# Patient Record
Sex: Female | Born: 1980 | Race: White | Hispanic: No | Marital: Married | Smoking: Former smoker
Health system: Southern US, Community
[De-identification: ages and names within clinical notes are randomized; demographics above are authoritative.]

## PROBLEM LIST (undated history)

## (undated) DIAGNOSIS — F4010 Social phobia, unspecified: Secondary | ICD-10-CM

## (undated) DIAGNOSIS — F419 Anxiety disorder, unspecified: Secondary | ICD-10-CM

## (undated) DIAGNOSIS — M549 Dorsalgia, unspecified: Secondary | ICD-10-CM

## (undated) DIAGNOSIS — K589 Irritable bowel syndrome without diarrhea: Secondary | ICD-10-CM

## (undated) HISTORY — PX: OVARIAN CYST REMOVAL: SHX89

## (undated) HISTORY — PX: APPENDECTOMY: SHX54

## (undated) HISTORY — DX: Irritable bowel syndrome, unspecified: K58.9

## (undated) HISTORY — DX: Dorsalgia, unspecified: M54.9

---
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
## 2002-07-28 ENCOUNTER — Other Ambulatory Visit: Admission: RE | Admit: 2002-07-28 | Discharge: 2002-07-28 | Payer: Self-pay | Admitting: Obstetrics and Gynecology

## 2002-09-07 ENCOUNTER — Ambulatory Visit (HOSPITAL_COMMUNITY): Admission: AD | Admit: 2002-09-07 | Discharge: 2002-09-07 | Payer: Self-pay | Admitting: Obstetrics & Gynecology

## 2002-09-07 ENCOUNTER — Encounter (INDEPENDENT_AMBULATORY_CARE_PROVIDER_SITE_OTHER): Payer: Self-pay | Admitting: Specialist

## 2002-09-17 ENCOUNTER — Inpatient Hospital Stay (HOSPITAL_COMMUNITY): Admission: EM | Admit: 2002-09-17 | Discharge: 2002-09-19 | Payer: Self-pay | Admitting: Emergency Medicine

## 2002-09-17 ENCOUNTER — Encounter: Payer: Self-pay | Admitting: Emergency Medicine

## 2002-09-18 ENCOUNTER — Encounter: Payer: Self-pay | Admitting: Internal Medicine

## 2002-12-14 ENCOUNTER — Emergency Department (HOSPITAL_COMMUNITY): Admission: EM | Admit: 2002-12-14 | Discharge: 2002-12-14 | Payer: Self-pay | Admitting: Emergency Medicine

## 2003-06-02 ENCOUNTER — Emergency Department (HOSPITAL_COMMUNITY): Admission: EM | Admit: 2003-06-02 | Discharge: 2003-06-02 | Payer: Self-pay | Admitting: Emergency Medicine

## 2004-05-31 ENCOUNTER — Emergency Department (HOSPITAL_COMMUNITY): Admission: EM | Admit: 2004-05-31 | Discharge: 2004-05-31 | Payer: Self-pay | Admitting: Emergency Medicine

## 2005-08-24 ENCOUNTER — Inpatient Hospital Stay (HOSPITAL_COMMUNITY): Admission: EM | Admit: 2005-08-24 | Discharge: 2005-09-01 | Payer: Self-pay | Admitting: Emergency Medicine

## 2006-01-22 ENCOUNTER — Inpatient Hospital Stay (HOSPITAL_COMMUNITY): Admission: AD | Admit: 2006-01-22 | Discharge: 2006-01-22 | Payer: Self-pay | Admitting: Gynecology

## 2006-09-27 ENCOUNTER — Emergency Department (HOSPITAL_COMMUNITY): Admission: EM | Admit: 2006-09-27 | Discharge: 2006-09-27 | Payer: Self-pay | Admitting: Emergency Medicine

## 2006-10-10 ENCOUNTER — Ambulatory Visit: Payer: Self-pay | Admitting: Family Medicine

## 2006-11-05 ENCOUNTER — Emergency Department (HOSPITAL_COMMUNITY): Admission: EM | Admit: 2006-11-05 | Discharge: 2006-11-05 | Payer: Self-pay | Admitting: Emergency Medicine

## 2006-11-07 ENCOUNTER — Emergency Department (HOSPITAL_COMMUNITY): Admission: EM | Admit: 2006-11-07 | Discharge: 2006-11-07 | Payer: Self-pay | Admitting: Emergency Medicine

## 2006-11-08 ENCOUNTER — Emergency Department (HOSPITAL_COMMUNITY): Admission: EM | Admit: 2006-11-08 | Discharge: 2006-11-09 | Payer: Self-pay | Admitting: Emergency Medicine

## 2007-02-26 ENCOUNTER — Emergency Department (HOSPITAL_COMMUNITY): Admission: EM | Admit: 2007-02-26 | Discharge: 2007-02-26 | Payer: Self-pay | Admitting: *Deleted

## 2007-04-03 ENCOUNTER — Ambulatory Visit: Payer: Self-pay | Admitting: Gastroenterology

## 2007-04-23 ENCOUNTER — Ambulatory Visit: Payer: Self-pay | Admitting: Gastroenterology

## 2007-05-03 ENCOUNTER — Encounter: Payer: Self-pay | Admitting: Gastroenterology

## 2007-06-28 ENCOUNTER — Inpatient Hospital Stay (HOSPITAL_COMMUNITY): Admission: AD | Admit: 2007-06-28 | Discharge: 2007-06-28 | Payer: Self-pay | Admitting: Gynecology

## 2007-08-16 DIAGNOSIS — Z8719 Personal history of other diseases of the digestive system: Secondary | ICD-10-CM

## 2007-08-16 DIAGNOSIS — R109 Unspecified abdominal pain: Secondary | ICD-10-CM

## 2009-08-12 ENCOUNTER — Emergency Department (HOSPITAL_COMMUNITY): Admission: EM | Admit: 2009-08-12 | Discharge: 2009-08-12 | Payer: Self-pay | Admitting: Emergency Medicine

## 2009-08-13 ENCOUNTER — Emergency Department (HOSPITAL_COMMUNITY): Admission: EM | Admit: 2009-08-13 | Discharge: 2009-08-14 | Payer: Self-pay | Admitting: Emergency Medicine

## 2009-08-16 ENCOUNTER — Emergency Department (HOSPITAL_COMMUNITY): Admission: EM | Admit: 2009-08-16 | Discharge: 2009-08-16 | Payer: Self-pay | Admitting: Emergency Medicine

## 2009-12-24 ENCOUNTER — Emergency Department (HOSPITAL_COMMUNITY): Admission: EM | Admit: 2009-12-24 | Discharge: 2009-12-24 | Payer: Self-pay | Admitting: Emergency Medicine

## 2010-04-05 ENCOUNTER — Emergency Department (HOSPITAL_COMMUNITY): Admission: EM | Admit: 2010-04-05 | Discharge: 2010-04-06 | Payer: Self-pay | Admitting: Emergency Medicine

## 2010-04-26 ENCOUNTER — Ambulatory Visit (HOSPITAL_COMMUNITY)
Admission: RE | Admit: 2010-04-26 | Discharge: 2010-04-26 | Payer: Self-pay | Source: Home / Self Care | Admitting: Urology

## 2010-07-04 LAB — HEMOGLOBIN AND HEMATOCRIT, BLOOD
HCT: 39.2 % (ref 36.0–46.0)
Hemoglobin: 13.9 g/dL (ref 12.0–15.0)

## 2010-07-04 LAB — SURGICAL PCR SCREEN
MRSA, PCR: NEGATIVE
Staphylococcus aureus: NEGATIVE

## 2010-07-04 LAB — BASIC METABOLIC PANEL
BUN: 12 mg/dL (ref 6–23)
CO2: 25 mEq/L (ref 19–32)
Calcium: 9.6 mg/dL (ref 8.4–10.5)
Chloride: 101 mEq/L (ref 96–112)
Creatinine, Ser: 0.85 mg/dL (ref 0.4–1.2)
GFR calc Af Amer: >60 mL/min (ref >60)
GFR calc non Af Amer: >60 mL/min (ref >60)
Glucose, Bld: 88 mg/dL (ref 70–99)
Potassium: 4.1 mEq/L (ref 3.5–5.1)
Sodium: 137 mEq/L (ref 135–145)

## 2010-07-04 LAB — HCG, QUANTITATIVE, PREGNANCY: hCG, Beta Chain, Quant, S: <2 m[IU]/mL (ref <5)

## 2010-07-05 ENCOUNTER — Ambulatory Visit (HOSPITAL_COMMUNITY)
Admission: RE | Admit: 2010-07-05 | Discharge: 2010-07-05 | Payer: Self-pay | Source: Home / Self Care | Attending: Urology | Admitting: Urology

## 2010-07-14 NOTE — Op Note (Addendum)
  NAME:  Amanda Rogers, Amanda Rogers             ACCOUNT NO.:  [id number]  MEDICAL RECORD NO.:  [id number]          PATIENT TYPE:  AMB  LOCATION:  DAY                           FACILITY:  [facility]  PHYSICIAN:  Ky Barban, M.D.DATE OF BIRTH:  [birth year]  DATE OF PROCEDURE: DATE OF DISCHARGE:                              OPERATIVE REPORT   PREOPERATIVE DIAGNOSIS:  Recurrent urinary tract infection.  POSTOPERATIVE DIAGNOSES:  Bladder neck polyps, hemorrhagic cystitis.  Excision of bladder neck polyps with fulguration and bladder biopsy with fulguration, cystogram, dilation of urethra.  ANESTHESIA:  General.  PROCEDURE:  The patient under general anesthesia in lithotomy position, usual prep and drape, #25 cystoscope introduced into the bladder, it was inspected.  There are diffuse areas of hemorrhages in the bladder consistent with hemorrhagic cystitis.  No tumor stone, foreign body. 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  There are polyps on the bladder neck, one of these polyps was biopsied, then using Greenwald electrode, circumferentially the bladder neck, which were all around the bladder neck, they were fulgurated.  A biopsy from the posterior bladder wall was also done with a flexible biopsy forceps.  Then the area was fulgurated with [facility] electrode.  The urethra was dilated to 30- [place], cystogram was performed with 250 mL of Renografin solution.  No reflux was seen and bladder was emptied, Foley catheter removed.  The patient left the operating room in satisfactory condition.     Ky Barban, M.D.     MIJ/MEDQ  D:  07/05/2010  T:  07/05/2010  Job:  [id number]  Electronically Signed by Alleen Borne M.D. on 07/14/2010 04:53:51 PM

## 2010-07-14 NOTE — H&P (Addendum)
  NAME:  Amanda Rogers, Amanda Rogers             ACCOUNT NO.:  [id number]  MEDICAL RECORD NO.:  [id number]          PATIENT TYPE:  AMB  LOCATION:  DAY                           FACILITY:  [facility]  PHYSICIAN:  Ky Barban, M.D.DATE OF BIRTH:  1980/07/23  DATE OF ADMISSION: DATE OF DISCHARGE:  LH                             HISTORY & PHYSICAL   CHIEF COMPLAINT:  Difficulty to void.  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  She is having recurrent urinary tract infection recently.  She was unable to void and went to the emergency room.  She had a Foley catheter which has been taken out since then.  She is now voiding.  No fever, chills, or gross hematuria.  No hematuria.  She had complete workup done in the office.  She had cystometrics that shows she has a hypertonic neurogenic bladder, urethral stenosis, and bladder neck polyps, so I have advised her to undergo dilation under anesthesia, fulguration, biopsy of the bladder neck polyps, and also cystogram to rule out reflux or recurrent urinary tract infections.  PAST MEDICAL HISTORY:  She has degenerative disk disease, also having sciatica on the right side.  PERSONAL HISTORY:  She does not smoke or drink.  REVIEW OF SYSTEMS:  Unremarkable.  PHYSICAL EXAMINATION:  VITAL SIGNS:  Blood pressure 96/59, temperature 98.2. CENTRAL NERVOUS SYSTEM:  Negative. HEENT/NECK:  Negative. CHEST:  Symmetrical. HEART:  Regular sinus rhythm. ABDOMEN:  Soft and flat.  Liver, spleen, and kidneys not palpable.  No CVA tenderness. PELVIC:  No adnexal mass or tenderness.  IMPRESSION: 1. Recurrent urinary tract infection. 2. Hypertonic neurogenic bladder. 3. Bladder neck polyps.  PLAN:  Dilation, cystogram, and fulguration of bladder neck polyps.     Ky Barban, M.D.     MIJ/MEDQ  D:  07/04/2010  T:  07/04/2010  Job:  [id number]  Electronically  Signed by Alleen Borne M.D. on 07/14/2010 04:53:49 PM

## 2010-08-27 ENCOUNTER — Emergency Department (HOSPITAL_COMMUNITY)
Admission: EM | Admit: 2010-08-27 | Discharge: 2010-08-28 | Disposition: A | Discharge status: Home / Self Care | Payer: 59 | Attending: Emergency Medicine | Admitting: Emergency Medicine

## 2010-08-27 DIAGNOSIS — R319 Hematuria, unspecified: Secondary | ICD-10-CM | POA: Insufficient documentation

## 2010-08-27 DIAGNOSIS — R109 Unspecified abdominal pain: Secondary | ICD-10-CM | POA: Insufficient documentation

## 2010-08-27 DIAGNOSIS — N200 Calculus of kidney: Secondary | ICD-10-CM | POA: Insufficient documentation

## 2010-08-28 ENCOUNTER — Ambulatory Visit (HOSPITAL_COMMUNITY)
Admission: RE | Admit: 2010-08-28 | Discharge: 2010-08-28 | Disposition: A | Discharge status: Home / Self Care | Payer: Commercial Managed Care - PPO | Source: Ambulatory Visit | Attending: Emergency Medicine | Admitting: Emergency Medicine

## 2010-08-28 DIAGNOSIS — N200 Calculus of kidney: Secondary | ICD-10-CM | POA: Insufficient documentation

## 2010-08-28 DIAGNOSIS — R1031 Right lower quadrant pain: Secondary | ICD-10-CM | POA: Insufficient documentation

## 2010-08-28 DIAGNOSIS — R112 Nausea with vomiting, unspecified: Secondary | ICD-10-CM | POA: Insufficient documentation

## 2010-08-28 LAB — URINALYSIS, ROUTINE W REFLEX MICROSCOPIC
Glucose, UA: NEGATIVE mg/dL
Leukocytes, UA: NEGATIVE
Nitrite: NEGATIVE
Specific Gravity, Urine: >1.03 — ABNORMAL HIGH (ref 1.005–1.030)
pH: 5.5 (ref 5.0–8.0)

## 2010-08-28 LAB — POCT PREGNANCY, URINE: Preg Test, Ur: NEGATIVE

## 2010-08-28 LAB — URINE MICROSCOPIC-ADD ON

## 2010-08-31 LAB — DIFFERENTIAL
Lymphocytes Relative: 25 % (ref 12–46)
Lymphs Abs: 2.3 10*3/uL (ref 0.7–4.0)
Monocytes Absolute: 0.6 10*3/uL (ref 0.1–1.0)
Monocytes Relative: 7 % (ref 3–12)
Neutro Abs: 6 10*3/uL (ref 1.7–7.7)

## 2010-08-31 LAB — WET PREP, GENITAL: Clue Cells Wet Prep HPF POC: NONE SEEN

## 2010-08-31 LAB — BASIC METABOLIC PANEL
GFR calc non Af Amer: >60 mL/min (ref >60)
Glucose, Bld: 88 mg/dL (ref 70–99)
Potassium: 3.8 mEq/L (ref 3.5–5.1)
Sodium: 140 mEq/L (ref 135–145)

## 2010-08-31 LAB — CBC
HCT: 41 % (ref 36.0–46.0)
Hemoglobin: 14.1 g/dL (ref 12.0–15.0)
RBC: 4.47 MIL/uL (ref 3.87–5.11)
WBC: 9.1 10*3/uL (ref 4.0–10.5)

## 2010-08-31 LAB — URINALYSIS, ROUTINE W REFLEX MICROSCOPIC
Ketones, ur: NEGATIVE mg/dL
Nitrite: NEGATIVE
pH: 5.5 (ref 5.0–8.0)

## 2010-08-31 LAB — GC/CHLAMYDIA PROBE AMP, GENITAL: Chlamydia, DNA Probe: NEGATIVE

## 2010-09-06 ENCOUNTER — Ambulatory Visit (HOSPITAL_COMMUNITY)
Admission: RE | Admit: 2010-09-06 | Discharge: 2010-09-06 | Disposition: A | Discharge status: Home / Self Care | Payer: Commercial Managed Care - PPO | Source: Ambulatory Visit | Attending: Urology | Admitting: Urology

## 2010-09-06 ENCOUNTER — Other Ambulatory Visit (HOSPITAL_COMMUNITY): Payer: Self-pay | Admitting: Urology

## 2010-09-06 DIAGNOSIS — R319 Hematuria, unspecified: Secondary | ICD-10-CM | POA: Insufficient documentation

## 2010-09-06 DIAGNOSIS — M549 Dorsalgia, unspecified: Secondary | ICD-10-CM

## 2010-09-06 DIAGNOSIS — R935 Abnormal findings on diagnostic imaging of other abdominal regions, including retroperitoneum: Secondary | ICD-10-CM | POA: Insufficient documentation

## 2010-09-06 DIAGNOSIS — K429 Umbilical hernia without obstruction or gangrene: Secondary | ICD-10-CM | POA: Insufficient documentation

## 2010-09-06 DIAGNOSIS — R109 Unspecified abdominal pain: Secondary | ICD-10-CM | POA: Insufficient documentation

## 2010-09-06 DIAGNOSIS — N200 Calculus of kidney: Secondary | ICD-10-CM | POA: Insufficient documentation

## 2010-09-06 MED ORDER — IOHEXOL 300 MG/ML  SOLN
100.0000 mL | Freq: Once | INTRAMUSCULAR | Status: AC | PRN
  Administered 2010-09-06: 100 mL via INTRAVENOUS

## 2010-09-07 LAB — POCT I-STAT, CHEM 8
BUN: 11 mg/dL (ref 6–23)
Chloride: 106 mEq/L (ref 96–112)
Creatinine, Ser: 0.6 mg/dL (ref 0.4–1.2)
Sodium: 138 mEq/L (ref 135–145)
TCO2: 23 mmol/L (ref 0–100)

## 2010-09-07 LAB — URINE MICROSCOPIC-ADD ON

## 2010-09-07 LAB — URINALYSIS, ROUTINE W REFLEX MICROSCOPIC
Bilirubin Urine: NEGATIVE
Nitrite: NEGATIVE
Specific Gravity, Urine: 1.018 (ref 1.005–1.030)
pH: 5.5 (ref 5.0–8.0)

## 2010-09-07 LAB — HEMOGLOBIN A1C
Hgb A1c MFr Bld: 5.7 % (ref 4.6–6.1)
Mean Plasma Glucose: 117 mg/dL

## 2010-11-04 NOTE — Consult Note (Signed)
NAME:  VEE, BAHE             ACCOUNT NO.:  [id number]   MEDICAL RECORD NO.:  [id number]          PATIENT TYPE:  INP   LOCATION:  5001                         FACILITY:  [facility]   PHYSICIAN:  Bevelyn Buckles. Champey, M.D.DATE OF BIRTH:  [birth year]   DATE OF CONSULTATION:  DATE OF DISCHARGE:                                   CONSULTATION   REASON FOR CONSULTATION:  Lower extremity weakness and back pain.   HISTORY OF PRESENT ILLNESS:  Miss Amanda Rogers is a 30 year old Caucasian female  with chronic back pain for the past 3 months who presents with acute  worsening of back pain since yesterday feeling weak all through her lower  extremities and right lower quadrant numbness. The patient felt weak and  slowly brought herself to the floor. She did not drop or fall. The patient  has also been complaining of urinary retention and constipation. She has  seen Dr. Ethelene Hal in the past for epidural shot which was without benefit. She  denies any symptoms in her upper extremities, neck pain, headache, vision  changes, speech or swallowing problems, chewing problems, vertigo or loss of  consciousness. The patient did have an MRI of the lumbar spine that showed  L5-S1 disc protrusion which was mild with mild neuroforaminal narrowing  without any nerve root compression. The patient states that she is currently  9 out of 10 pain, however, she seems very comfortable and cooperative  throughout this examination.   Past medical history is positive for back problems.   Current medications include Naproxen.   ALLERGIES:  TO TRAMADOL WHICH CAUSES ITCHING.   Family history is positive for back problems.   SOCIAL HISTORY:  The patient lives with her boyfriend, denies any smoking,  alcohol or drug use.   Review of systems is positive as per HPI, review of systems is negative as  per HPI     in greater than 8 other organ systems.   PHYSICAL EXAMINATION:  VITAL SIGNS: Temperature is 100.0, pulse is 97,  respirations 20, blood pressure is 94/62, O2 sat is 98% on room air.  HEENT: Normocephalic, atraumatic. Extraocular movements are intact. Pupils  are equal, round, and reactive to light.  NECK: Supple, no carotid bruits.  HEART: Regular.  LUNGS: Clear.  ABDOMEN: Soft, nontender.  EXTREMITIES: No edema with good pulses.  NEUROLOGICAL EXAMINATION: The patient is awake, alert, and oriented x3.  Language is fluent. Memory and knowledge are within normal limits. Cranial  nerves II through XII are grossly intact. Motor examination, the patient has  4+ to 5 out of 5 strength in her upper extremities bilaterally. She has poor  effort in the lower extremities and this is secondary to pain as per  patient. She does have a positive Hoover sign noted on examination. She does  give way in all 4 extremities. The patient has normal tone and no drift  noted. Sensory examination, the patient has decreased sensation in right  lower extremity when compared to the left. Sensation is otherwise within  normal limits to light touch in the upper extremities. Reflexes 1 to 2+ and  symmetric, toes  are downgoing bilaterally. Cerebellar function is within  normal limits. Finger-to-nose and gait not assessed secondary to safety.   MRI of the lumbar spine showed mild degenerative disc disease without nerve  root compression. No current labs are available.   IMPRESSION:  This is a 30 year old Caucasian female with severe back pain,  degenerative disc disease with an episode of bilateral lower extremity  weakness and right lower extremity numbness. The patient has had give way,  weakness and Hoover sign on examination as our examination is limited  secondary to pain and give way weakness. We need to optimally control the  patient's pain as pain medications seem to be working as per patient. We  need to get PT/OT evaluation. The patient will need EMG nerve conduction  studies performed as an outpatient to evaluate for  any possible  radiculopathy. Can get the nerve conduction EMG as an outpatient and we can  follow the patient as an outpatient. Office number is [phone]. Will follow  the patient while she is in the hospital.      Bevelyn Buckles. Nash Shearer, M.D.  Electronically Signed     DRC/MEDQ  D:  08/25/2005  T:  08/28/2005  Job:  [id number]

## 2010-11-04 NOTE — Discharge Summary (Signed)
NAME:  Amanda Rogers, Amanda Rogers             ACCOUNT NO.:  [id number]   MEDICAL RECORD NO.:  [id number]          PATIENT TYPE:  INP   LOCATION:  5001                         FACILITY:  [facility]   PHYSICIAN:  Sherin Quarry, MD      DATE OF BIRTH:  11-24-1980   DATE OF ADMISSION:  08/24/2005  DATE OF DISCHARGE:  08/31/2005                                 DISCHARGE SUMMARY   HISTORY:  Amanda Rogers is a 30 year old lady who was initially  transferred to [facility] on March 8 from [facility].  The purpose of her transfer was to see Dr. Jeral Fruit of the neurosurgical  service.  The patient was apparently initially evaluated by  Dr. Noel Gerold in  the emergency room, who then called to Dr. Jeral Fruit to evaluate her.  The  patient and family became dissatisfied and insisted on her admission, but  did not wish to be admitted by Dr. Noel Gerold.  The patient was therefore  assigned to the [facility] as an unassigned transfer.  She was  initially seen by Dr. Derenda Mis.  Dr. Ladona Ridgel indicated that the patient  reported that she had a work injury in her low back in November, as a result  of lifting a heavy animal.  She was evaluated by Dr. Noel Gerold, who has  performed an MRI scan in December and placed her on prednisone for her pain.  He also prescribed three epidural steroid injections with limited benefit.  Dr. Noel Gerold had previously advised her that she was not a surgical candidate.   On presentation August 25, 2005 the patient described her back pain is 9/10 in  strength, left greater than right.  The pain seemed to radiate down both  legs.  The patient stated that she was incapacitated by pain.  She indicated  the pain was not relieved by taking Vicodin or Percocet as an outpatient.  She also indicated that she was having difficulty urinating and that she  would need to have a catheter placed. She indicated that she had been  chronically constipated.   PHYSICAL EXAMINATION:  GENERAL:  On  physical exam Dr. Derenda Mis  reported that her temperature was 98.1, blood pressure 112/69, pulse 90,  respirations 18, O2 saturation 97%.  HEENT:  Exam was within normal limits.  CHEST:  The chest was clear.  CARDIOVASCULAR:  Examination revealed normal S1-S2, without rubs, murmurs or  gallops.  ABDOMEN:  The abdomen was benign.  NEUROLOGIC:  On neurologic testing she described the patient as awake, alert  and oriented. Cranial nerves II-XII were intact.  The patient was noted to  be able to bend her legs at the knee, and move the legs from the ankle up to  the knee.  The legs could be manipulated without pain.  Sensory exam was  inconsistent.   CONSULTATIONS:  Dr. Ladona Ridgel requested a consultation of Dr. Bevelyn Buckles. Champey  of the neurology service.  Dr. Nash Shearer obtained essentially the same  history, (i.e. a 14-month history of chronic back pain with acute worsening  of back pain on the day prior to admission).  An  MRI scan of the spine was  repeated in the emergency room, and this study showed mild degenerative  changes of the lower thoracic spine, as well as mild degenerative changes at  L5-S1 with no evidence of nerve root compression.  Dr. Nash Shearer concluded  from his neurologic examination that the patient's leg weakness was  volitional.  He recommended continued pain medication and suggested that an  EMG and nerve conduction velocity study be performed as an outpatient. He  had no additional recommendations.   The patient was admitted to the hospital and was administered morphine for  pain. A Foley catheter was placed. I was advised by  Dr. Jeral Fruit and his  partners of the neurosurgical service, that they would not, under any  circumstances, see the patient in consultation.  I informed Dr. Noel Gerold of  these statements.  The patient continued to complain of low back discomfort  and weakness.  On March 15, I once again had Dr. Nash Shearer see the patient.  Dr. Nash Shearer indicated that  there was no change in her examination; that her  leg weakness was volitional and that she did not have any evidence of nerve  root compression.  I attempted to arrange for a transfer of the patient to  the [facility] at the patient's father's request.  I was advised by the  physician at [facility] that they would not accept her in transfer, and  she did not have an objective reason to be in the hospital.  They provided  me with numbers so that an outpatient evaluation at [facility] could be arranged.  These numbers were for [facility] and the chronic pain clinic.  I  provided the patient's father with these numbers.  On August 31, 2005 the  patient was discharged.   DISCHARGE DIAGNOSES:  1.  URINARY TRACT INFECTION. Note that the patient was placed on Cipro at      the time of admission.  Also note that urine culture obtained at the      time of admission grew an E-coli sensitive to all antibiotics tested.  2.  CHRONIC BACK PAIN: Possibly secondary to degenerative joint disease.  3.  CHRONIC LEG WEAKNESS:  Uunexplained by objective evaluation.  4.  CHRONIC URINARY RETENTION:  Possibly secondary to pain medications.  5.  CHRONIC CONSTIPATION:   DISCHARGE MEDICATIONS:  At the time of discharge I advised the patient I  would provide her with prescriptions for:  1.  Cipro 250 mg b.i.d. for three additional days.  This would complete 7      days of antibiotic therapy.  2.  Tylox 1-2 q.6 h p.r.n. for pain.  3.  Neurontin 3 mg t.i.d.  4.  Lactulose 1-2 tablespoons p.o. p.r.n. for constipation.   DISPOSITION:  It is not entirely clear what the patient's family's plans are  going to be at the time of discharge.  As indicated, I provided them with  contact numbers for [facility] and further advised them that [facility] would  not accept the patient in transfer as an inpatient.           ______________________________  Sherin Quarry, MD    SY/MEDQ  D:  08/31/2005  T:  09/01/2005  Job:   [id number]   cc:   Sharolyn Douglas, M.D.  Fax: [phone]   Bevelyn Buckles. Nash Shearer, M.D.  Fax: [phone]

## 2010-11-04 NOTE — Discharge Summary (Signed)
NAME:  Amanda Rogers, Amanda Rogers                       ACCOUNT NO.:  [id number]   MEDICAL RECORD NO.:  [id number]                   PATIENT TYPE:  INP   LOCATION:  0347                                 FACILITY:  [facility]   PHYSICIAN:  Deirdre Peer. Polite, M.D.              DATE OF BIRTH:  1980/11/22   DATE OF ADMISSION:  09/17/2002  DATE OF DISCHARGE:  09/19/2002                                 DISCHARGE SUMMARY   DISCHARGE DIAGNOSES:  1. Pyelonephritis.  2. Dehydration.  3. Nausea and vomiting.   DISCHARGE MEDICATIONS:  Cipro 250 mg, 1 every 12h for 7 days.   CONSULTATIONS:  None.   STUDIES:  UA significant for urine nitrites positive, 21-50 WBCs, packed  with bacteria. BMET within normal limits. CBC significant for white count of  12.7, CBC at discharge normal WBC count. Blood cultures negative to date. CT  of the chest within normal limits.   Renal ultrasound septal increase parenchymal echogenicity of right kidney  which is nonspecific, however, may be due to pyelonephritis. No evidence of  hydronephrosis. Chest x-ray no active disease.   HISTORY OF PRESENT ILLNESS:  A pleasant, 30 year old, white female presented  to the ED for progressive fever, chills, persistent vomiting over the past  three days. Her last evaluation in the ED was significant for UTI, exam was  consistent with pyelonephritis. Of note, the patient recently underwent a  surgical procedure D&C approximately 10 days prior to admission; however, at  this time, the patient denies any vaginal odor, discharge or pain. Admission  was deemed necessary for further evaluation and treatment of pyelonephritis.   PAST MEDICAL HISTORY:  As stated above.   MEDICATIONS ON ADMISSION:  None.   FAMILY HISTORY:  Noncontributory.   SOCIAL HISTORY:  Negative.   HOSPITAL COURSE:  A 30 year old white female admitted with a diagnosis of  pyelonephritis. The patient was treated with empiric antibiotics in the form  of Tequin,  ultimately was converted to Cipro. She was given judicious IV  fluids. Her hospital course was one of continued improvement. There was  nausea and vomiting, no abdominal pain. The patient had defervescence of her  fever. Blood cultures are negative at time of discharge. Urine culture is  pending. Ultrasound as stated above could be consistent with pyelonephritis.  The patient's course was somewhat complicated by complaint of chest pain,  pleuritic in nature. Imaging:  A followup CAT scan was negative for PE. At  this time, the patient was tolerating  full p.o. and is medically stable for  discharge to home.                                               Deirdre Peer. Polite, M.D.    RDP/MEDQ  D:  09/19/2002  T:  09/20/2002  Job:  [id number]

## 2010-11-04 NOTE — Assessment & Plan Note (Signed)
[facility]                         GASTROENTEROLOGY OFFICE NOTE   MAYTTE, JACOT                    MRN:          [id number]  DATE:04/03/2007                            DOB:          [birth year]    GASTROENTEROLOGY CONSULTATION   REQUESTING PHYSICIAN:  Sheppard Penton. Stacie Acres, M.D.   REASON FOR CONSULTATION:  Abdominal pain associated with alternating  diarrhea and constipation.   HISTORY OF PRESENT ILLNESS:  This is a 30 year old white female who  relates a 4 to 6 week history of worsening problems with alternating  constipation and diarrhea.  She has had intermittent nausea and vomiting  as well as gas, bloating and lower abdominal pain.  Her lower abdominal  pain is mainly associated with bowel movements.  Three to four days ago  she noted small amounts of bright red blood per rectum while wiping  after a bowel movement.  She relates a history of significant  constipation as a child but has not generally had any significant  gastrointestinal issues until about six weeks ago.  She was seen in  [facility] emergency room on February 26, 2007.  I  have reviewed the data from that visit.  A CBC, urinalysis,  comprehensive metabolic panel, lipase and urine pregnancy test were all  negative.  She notes a slight decrease in appetite over the past few  months that she relates to Lexapro and Klonopin.  CT scan of the abdomen  was performed in December of 2004 which was essentially unremarkable.  A  pelvic and transvaginal ultrasound performed on September 27, 2006 was  normal.   FAMILY HISTORY:  Remarkable for a maternal grandmother with colon  polyps, no other family members with colon polyps, colon cancer or  inflammatory bowel disease.   PAST MEDICAL HISTORY:  Anxiety  Panic disorder  Depression  Status post D&Cx2 in 2003 and 2004   CURRENT MEDICATIONS:  1. Lexapro 20 mg p.o. daily.  2. Klonopin 1 mg p.o. b.i.d.  3. Ibuprofen  p.r.n. headache.   MEDICATION ALLERGIES:  ULTRAM leading to itching.   SOCIAL HISTORY AND REVIEW OF SYSTEMS:  Per the handwritten form.   PHYSICAL EXAMINATION:  GENERAL APPEARANCE:  Well-developed, well-  nourished white female in no acute distress.  VITAL SIGNS:  Height 5 feet, 5-1/2 inches, weight 123.8 pounds.  Blood  pressure 100/68. Pulse is 78 and regular.  HEENT:  Anicteric sclerae. Oropharynx clear.  CHEST:  Clear to auscultation bilaterally.  CARDIAC:  Regular rate and rhythm without murmurs.  ABDOMEN:  Soft, nontender, nondistended.  Normal active bowel sounds.  No palpable organomegaly, masses or hernias.  RECTAL:  Exam deferred to time of colonoscopy.  EXTREMITIES:  Without cyanosis, clubbing or edema.  NEUROLOGICAL:  Alert and oriented x3.  Grossly nonfocal.   ASSESSMENT/PLAN:  Alternating diarrhea and constipation associated with  lower abdominal pain, an unexplained weight loss and small volume  hematochezia.  Rule out inflammatory bowel disease, irritable bowel  syndrome, hemorrhoids and other disorders.  Obtain a TSH today.  Begin  Robinul Forte 1/2 to 1 whole tablet b.i.d. p.r.n.  Begin a  high fiber  diet and increased fluid intake.  The risks, benefits and alternatives  to colonoscopy with possible biopsy and possible polypectomy discussed  with the patient.  She consents to proceed.  This was scheduled  electively.     Venita Lick. Russella Dar, MD, [facility]  Electronically Signed    MTS/MedQ  DD: 04/08/2007  DT: 04/09/2007  Job #: [id number]   cc:   Sheppard Penton. Stacie Acres, M.D.

## 2010-11-04 NOTE — Op Note (Signed)
   NAME:  Amanda Rogers, Amanda Rogers                       ACCOUNT NO.:  [id number]   MEDICAL RECORD NO.:  [id number]                   PATIENT TYPE:  AMB   LOCATION:  SDC                                  FACILITY:   PHYSICIAN:  Ilda Mori, M.D.                DATE OF BIRTH:  05-Oct-1980   DATE OF PROCEDURE:  09/07/2002  DATE OF DISCHARGE:                                 OPERATIVE REPORT   PREOPERATIVE DIAGNOSIS:  Incomplete abortion.   POSTOPERATIVE DIAGNOSIS:  Incomplete abortion.   OPERATION/PROCEDURE:  Dilatation and evacuation.   SURGEON:  Ilda Mori, M.D.   ANESTHESIA:  Paracervical block with IV sedation.   ESTIMATED BLOOD LOSS:  100 cc.   FINDINGS:  Products of conception.   INDICATIONS:  This is a 30 year old primigravid female who has had  approximately eight weeks of amenorrhea.  She was seen in the office seven  days ago and noted to have what appeared to be a missed abortion with a  distorted gestational sac and no development of the fetal pole.  This  finding was discussed with the patient.  Decision was made to observe for  approximately a week and to repeat the ultrasound to confirm a diagnosis of  missed AB.  It was also felt that since the pregnancy was fairly early that  a spontaneous complete abortion was certainly possible.  The patient noted  the onset of heavy vaginal bleeding this afternoon with the passage of large  clots and severe cramping.  Because of the extent of the hemorrhage, the  decision was made to proceed with suction curettage.   DESCRIPTION OF PROCEDURE:  The patient was brought to the operating room and  IV sedation was administered.  The pelvic exam was performed which showed  large amount of blood in the vagina and coming from the cervix and a  retroverted eight week size uterus.  The speculum was placed and the cervix  was visualized and grasped with a single-tooth tenaculum.  Twenty ml of 1%  lidocaine was infiltrated in the  paracervical tissues.  A #8 suction curet  was then introduced easily into the endometrial cavity without the necessity  of dilatation.  Suction was applied and the endometrial cavity was carefully  suctioned until no tissue or blood was felt to be remaining in the uterus.  The procedure was then terminated and the patient left the operating room in  good condition.                                               Ilda Mori, M.D.    RK/MEDQ  D:  09/07/2002  T:  09/08/2002  Job:  [id number]

## 2010-11-04 NOTE — Consult Note (Signed)
NAME:  Amanda Rogers, Amanda Rogers             ACCOUNT NO.:  [id number]   MEDICAL RECORD NO.:  [id number]          PATIENT TYPE:  INP   LOCATION:  5001                         FACILITY:  [facility]   PHYSICIAN:  Lindaann Slough, M.D.  DATE OF BIRTH:  04-28-1981   DATE OF CONSULTATION:  08/31/2005  DATE OF DISCHARGE:                                   CONSULTATION   REASON FOR CONSULTATION:  Urinary retention.   The patient is a 30 year old female who hurt her back at work in November  2006.  She was evaluated by Dr. Noel Gerold and had an MRI of the lumbar spine  that showed an L5-S1 disk protrusion which was mild and mild neural  foraminal narrowing without nerve root compression.  About a week ago she  was picking up some stuff from the floor at home and hurt her back again and  she had been unable to urinate on her own since.  She voids only a small  amount of urine at a time.  She was catheterized for about 700 mL.  She has  since failed three voiding trials.  She currently has an indwelling Foley  catheter that is draining clear urine.  Prior to her reinjuring her back,  she was voiding well without any problems.   PAST MEDICAL HISTORY:  Positive for back problems.   SOCIAL HISTORY:  She does not smoke nor drink.  She is single.   FAMILY HISTORY:  Her father has a history of back problems.   ALLERGIES:  She is allergic to TRAMADOL.   MEDICATIONS:  She is currently on Neurontin 300 mg, Cipro 250 mg twice a  day, morphine sulfate, Benadryl, lorazepam, Robaxin, oxycodone, and Zofran.   REVIEW OF SYSTEMS:  She complains of numbness and tingling of lower  extremities and inability to urinate, and all others are negative.   PHYSICAL EXAMINATION:  Blood pressure is 98/78, pulse 90, respirations 20,  temperature 98.  Foley catheter is in place and draining clear urine.   Her hemoglobin on March 14 was 13.5, hematocrit 39.3, and WBC 4.21.  Sodium  141, potassium 3.8, BUN 8, creatinine 1.0.  Urinalysis  showed 7-10 wbc's, 0-  2 rbc's, nitrite negative.  Urine culture on March 10 was positive for E.  coli sensitive to Cipro.   IMPRESSION:  1.  Urinary retention.  2.  Urinary tract infection.  3.  Neurogenic bladder, probably secondary to analgesics.   SUGGESTION:  1.  Leave Foley catheter indwelling.  Can go home with the Foley.  2.  Continue Cipro.  3.  We will follow the patient next week in the office.  If she is still      unable to urinate, we will then proceed with video urodynamics.      Lindaann Slough, M.D.  Electronically Signed     MN/MEDQ  D:  08/31/2005  T:  09/02/2005  Job:  [id number]

## 2010-11-04 NOTE — H&P (Signed)
NAME:  Amanda Rogers, TAMURA             ACCOUNT NO.:  [id number]   MEDICAL RECORD NO.:  [id number]          PATIENT TYPE:  INP   LOCATION:  5001                         FACILITY:  [facility]   PHYSICIAN:  Carlyon L. Ladona Ridgel, MD  DATE OF BIRTH:  [birth year]   DATE OF ADMISSION:  08/24/2005  DATE OF DISCHARGE:                                HISTORY & PHYSICAL   CHIEF COMPLAINT:  Decreased urination, leg weakness and request for change  of attending.   PRIMARY CARE PHYSICIAN:  Unassigned. Patient has none.   HISTORY OF PRESENT ILLNESS:  The patient is a 30 year old white female who  was transferred from [facility] for evaluation by Dr. Jeral Fruit after  an acute on chronic exacerbation of lower back pain. The patient was  accepted to Dr. Sherren Mocha service who previously had treated her as an  outpatient. She was seen and evaluated by Dr. Noel Gerold who then proceeded to  call Dr. Jeral Fruit for evaluation who initiated a neurology consult because of  lower extremity weakness. The patient's family and the patient were  dissatisfied and therefore requested that attending be changed. We therefore  were consulted for an unassigned transfer to our service.   The patient evidently had a work injury of her lower back in November. 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 The patient went for evaluation  to Dr. Noel Gerold. She had an MRI in December and was placed on prednisone for  pain. She also underwent epidural steroid injection x3 at the outpatient  [facility] without relief of her lower back pain. The patient was  told by Dr. Noel Gerold that there no surgical intervention necessary at this time  as she had only minor lumbar degenerative changes as well as stenosis and  the bulging of the disk in her lower back was mild and should not be causing  the pain. The patient was reevaluated during this admission by Dr. Noel Gerold  and  as stated, the patient requested to be transferred off his service. Dr.  Jeral Fruit is still scheduled to see this patient to give a second opinion  regarding a surgical intervention. On the day of this history and physical  the patient describes her pain as 9 out of 10, left greater than right-sided  pain. It came on after she bent over to pick up something on the floor. She  states that the pain was radiating down both legs at the time. At this point  she really is incapacitated related to the pain. She states that she tried a  friend's Lidocaine patch that did not give her any relief, and she was given  Vicodin and Percocet as an outpatient and all they did was make her stomach  upset. She states at this time the only thing that controls her pain is  morphine and once given that does totally alleviate her discomfort.   REVIEW OF SYSTEMS:  She complains of right leg numbness, tingling in both.  She complains of and exhibits urinary retention which required  in-and-out  catheterization x2 followed by placement of a Foley catheter. She states she  had no bowel movement since March 7. She relates that her menses was 2 weeks  ago. She states that her lower back pain feels as though she is being  stabbed in the back spinal cord. She was unable to walk prior to coming  in. She said she had to crouch and now with the continued inability to  urinate she is very frustrated. I have reviewed what the patient was taking  for pain medication at home. It sounds like she may have been on Naprosyn  although she states she was perhaps on another medicine that starts with  an N-P. On further review of systems she denies fever, chills, nausea,  vomiting or abdominal pain, all else are negative. See the HPI for further  elucidation of other review of systems.   PAST MEDICAL HISTORY:  1.  Lower back injury in November.  2.  She describes no abnormal Pap smears and her last Pap smear was 1.5      years  ago.   PAST SURGICAL HISTORY:  She had a D&C 3 years ago.   SOCIAL HISTORY:  She denies tobacco or ethanol. She works as a Dispensing optician.   FAMILY HISTORY:  Mom is living and is healthy. Dad is living with back  issues.   ALLERGIES:  TRAMADOL - causes itching, she claims that PERCOCET AND VICODIN  cause nausea.   MEDICATIONS:  Advil or Tylenol p.r.n.   PHYSICAL EXAMINATION:  VITAL SIGNS:  Temperature is 98.1, blood pressure  112/69, pulse is 90, respirations 18, saturation 97%.  HEENT:  She is normocephalic, atraumatic. Pupils are equal, round and  reactive to light. Extraocular muscles are intact. Mucous membranes are  moist. Her eyes are very dry secondary to her contacts. She has no  conjunctival injection.  CHEST:  Clear to auscultation with no rhonchi, rales or wheezes.  CARDIOVASCULAR:  Regular rate and rhythm, positive S1, S2, no S3, S4, no  murmurs, rubs or gallops.  ABDOMEN:  Soft, nontender, nondistended with positive bowel sounds.  EXTREMITIES:  Show no clubbing, cyanosis or edema.  NEUROLOGIC:  She is awake, alert and oriented. Cranial nerves II-XII are  intact. Power is 4+ out of 5. While the patient states she cannot lift her  legs off the bed, she is able to bend them at the knee and climb them up.  With passive movement of the legs she is unable to attain even a 30 degree  angle without saying that she is in pain. However if you take your hand away  she will hold the leg up and help lift it without too much discomfort. The  only sensory deficit was noted on the left shin which at the middle of the  shin, forward distribution appeared to be dull. However further testing of  the upper and lower dermatome showed that the sensation was intact. There  does not appear to be any other sensory deficits including a thoracic or  cervical deficit.   LABS AT TIME OF ADMISSION:  None.   ASSESSMENT AND PLAN:  This is a 30 year old white female who injured her back  at work lifting a heavy animal. The patient states that she re-injured  herself on the day of admission while cleaning her home. She states a pain  developed in her back after she bent over that felt like someone was  stabbing her with a knife. She  continues to have an 8 out of 10 pain and  requests opiates which seem to be the only thing to control her pain. The  patient states she cannot raise herself to a full standing position and had  to crouch and is unable to walk.  EMS took her to [place]. She states that she was transferred at the  hospital's request to see Dr. Jeral Fruit. However other notes indicate that the  patients family insisted that she be transferred. The patient's hospital  course has been complicated by urinary retention and obstipation.   PROBLEM #1. Genitourinary. Will continue to leave her Foley in place, check  a urinalysis, C&S because she did have a traumatic Foley and there is a  perineal edema. If she has a urinary tract infection we will treat that with  intravenous antibiotics.   PROBLEM #2. Gastroenterology. Will add Colace for obstipation.   PROBLEM #3. Neurologic. It is noted that the neurologist wants to possibly  due EMG studies as an outpatient. For tonight I will give her a trial of  Neurontin (if she is not pregnant) and see if this helps with the pain at  all. Will use some Toradol IV and then use p.o. morphine low-dose as a back  up if she is unable to tolerate the other pain medications. Will check a  CRP, ESR and TSH to rule out other causes for lower back pain.   PROBLEM #4. Pain control. For now will use some Toradol IV and leave a back  up dose of morphine immediate release p.o. as well as start her on some  Neurontin.   PROBLEM #5.  Deep venous thrombosis prophylaxis. Will use SCDs for now.      Yeilyn L. Ladona Ridgel, MD  Electronically Signed     MLT/MEDQ  D:  08/26/2005  T:  08/26/2005  Job:  [id number]   cc:   Hilda Lias, M.D.   Fax: [phone]

## 2011-03-08 LAB — URINALYSIS, ROUTINE W REFLEX MICROSCOPIC
Bilirubin Urine: NEGATIVE
Nitrite: NEGATIVE
Specific Gravity, Urine: <1.005 — ABNORMAL LOW
Urobilinogen, UA: 0.2
pH: 7

## 2011-03-08 LAB — WET PREP, GENITAL
Trich, Wet Prep: NONE SEEN
Yeast Wet Prep HPF POC: NONE SEEN

## 2011-03-08 LAB — GC/CHLAMYDIA PROBE AMP, GENITAL
Chlamydia, DNA Probe: NEGATIVE
GC Probe Amp, Genital: NEGATIVE

## 2011-03-08 LAB — CBC
Hemoglobin: 13.1
MCHC: 36
RBC: 3.97
WBC: 11.4 — ABNORMAL HIGH

## 2011-03-08 LAB — HCG, QUANTITATIVE, PREGNANCY: hCG, Beta Chain, Quant, S: 22425 — ABNORMAL HIGH

## 2011-03-08 LAB — POCT PREGNANCY, URINE: Operator id: 220991

## 2011-03-08 LAB — ABO/RH: ABO/RH(D): A POS

## 2011-03-31 LAB — COMPREHENSIVE METABOLIC PANEL
AST: 16
CO2: 27
Calcium: 9.3
Creatinine, Ser: 0.81
GFR calc Af Amer: >60
GFR calc non Af Amer: >60

## 2011-03-31 LAB — URINALYSIS, ROUTINE W REFLEX MICROSCOPIC
Bilirubin Urine: NEGATIVE
Glucose, UA: NEGATIVE
Hgb urine dipstick: NEGATIVE
Ketones, ur: NEGATIVE
Nitrite: NEGATIVE
Specific Gravity, Urine: 1.015
pH: 7.5

## 2011-03-31 LAB — LIPASE, BLOOD: Lipase: 23

## 2011-03-31 LAB — CBC
MCHC: 34.2
MCV: 91.4
Platelets: 248
RBC: 4.43

## 2011-03-31 LAB — DIFFERENTIAL
Eosinophils Relative: 2
Lymphocytes Relative: 18
Lymphs Abs: 1.7

## 2012-05-04 ENCOUNTER — Encounter (HOSPITAL_COMMUNITY): Payer: Self-pay | Admitting: Emergency Medicine

## 2012-05-04 ENCOUNTER — Emergency Department (HOSPITAL_COMMUNITY)
Admission: EM | Admit: 2012-05-04 | Discharge: 2012-05-04 | Disposition: A | Discharge status: Home / Self Care | Payer: PRIVATE HEALTH INSURANCE | Source: Home / Self Care

## 2012-05-04 DIAGNOSIS — F411 Generalized anxiety disorder: Secondary | ICD-10-CM

## 2012-05-04 DIAGNOSIS — R4589 Other symptoms and signs involving emotional state: Secondary | ICD-10-CM

## 2012-05-04 HISTORY — DX: Anxiety disorder, unspecified: F41.9

## 2012-05-04 HISTORY — DX: Social phobia, unspecified: F40.10

## 2012-05-04 MED ORDER — CLONAZEPAM 0.5 MG PO TABS: 0.5000 mg | ORAL_TABLET | Freq: Two times a day (BID) | ORAL | Status: DC | PRN

## 2012-05-04 NOTE — ED Provider Notes (Signed)
Medical screening examination/treatment/procedure(s) were performed by non-physician practitioner and as supervising physician I was immediately available for consultation/collaboration.  Giani Winther, M.D.   Ellasyn Swilling C Jenea Dake, MD 05/04/12 2233 

## 2012-05-04 NOTE — ED Provider Notes (Signed)
History     CSN: [id number]  Arrival date & time 05/04/12  1707   None     Chief Complaint  Patient presents with  . Anxiety    (Consider location/radiation/quality/duration/timing/severity/associated sxs/prior treatment) HPI Comments: 31 year old female presents with acute anxiety. She has a history of general anxiety disorder and is treated with Lexapro currently. And her remote history she was also treated with clonazepam. She was in [place] and received a call earlier this week that her father had a stroke and she went to the urgent care at her home and was given Xanax for the flight here. She takes Xanax and probably a couple of hours she has relief of anxiety but due to the short half-life anxiety returns and she has racing thoughts and inability to remain calm and becomes hyperactive due to severe anxiety. She states that she knows Xanax as a short half-life and have not worked well for her in the past and she mentions clonazepam that she could years ago and later weaned off.   Past Medical History  Diagnosis Date  . Anxiety   . Social anxiety disorder     Past Surgical History  Procedure Date  . Cesarean section   . Appendectomy     No family history on file.  History  Substance Use Topics  . Smoking status: Never Smoker   . Smokeless tobacco: Not on file  . Alcohol Use: Yes    OB History    Grav Para Term Preterm Abortions TAB SAB Ect Mult Living                  Review of Systems  Psychiatric/Behavioral:       As per history of present illness  All other systems reviewed and are negative.    Allergies  Ultram  Home Medications   Current Outpatient Rx  Name  Route  Sig  Dispense  Refill  . ESCITALOPRAM OXALATE 20 MG PO TABS   Oral   Take 20 mg by mouth daily.         Marland Kitchen LEVONORGESTREL-ETHINYL ESTRAD 0.1-20 MG-MCG PO TABS   Oral   Take 1 tablet by mouth daily.         Marland Kitchen CLONAZEPAM 0.5 MG PO TABS   Oral   Take 1 tablet (0.5 mg total) by  mouth 2 (two) times daily as needed for anxiety.   20 tablet   0     BP 132/81  Pulse 94  Temp 99.4 F (37.4 C) (Oral)  Resp 18  SpO2 98%  LMP 05/01/2012  Physical Exam  Constitutional: She is oriented to person, place, and time. She appears well-developed and well-nourished. No distress.  HENT:  Head: Normocephalic and atraumatic.  Neck: Neck supple.  Pulmonary/Chest: Effort normal.  Musculoskeletal: Normal range of motion.  Neurological: She is alert and oriented to person, place, and time. No cranial nerve deficit.  Skin: Skin is warm and dry.  Psychiatric: Her mood appears anxious. Her affect is labile. Her speech is rapid and/or pressured. She is agitated. Cognition and memory are not impaired. She expresses no homicidal and no suicidal ideation.    ED Course  Procedures (including critical care time)  Labs Reviewed - No data to display No results found.   1. GAD (generalized anxiety disorder)   2. Anxiety as acute reaction to exceptional stress       MDM  Stop the Xanax. Clonazepam 0.5 mg twice a day when necessary nerves #20  Followup with your doctor as soon as possible may also consider the behavioral health center locally.         Hayden Rasmussen, NP 05/04/12 [id number]

## 2012-05-04 NOTE — ED Notes (Signed)
Pt c/o anxiety since Thursday when she found out her father had a stroke... Flew in from [place] on Friday and was given xanax .5mg  before coming to [facility]... Sx include: nauseas, diarrhea, headaches, not sleeping well... Denies: fevers, vomiting, SOB, chest tightness... Pt is alert w/no signs of distress... Hx of anxiety and social anxiety

## 2013-01-23 IMAGING — CT CT ABD-PELV W/ CM
2 of 4 series · 16 of 46 positions shown, 18 images · IV contrast (Omnipaque 300)
Comparison: 08/28/2010

CLINICAL DATA: : Abdominal and back pain, hematuria, history kidney
stones, appendectomy, cesarean section

CT ABDOMEN AND PELVIS WITH CONTRAST
TECHNIQUE: Multidetector CT imaging of the abdomen and pelvis was
performed following the standard protocol during bolus
administration of intravenous contrast. Breast shield utilized.
Sagittal and coronal MPR images reconstructed from axial data set.
Contrast: 100 ml Omnipaque 300 IV; No oral contrast administered.

[Series 2: abd_pel_with 5.0 b40f · axial · 0.67mm/px · z∈[-517,-72]mm · 13 of 97 slices shown, 15 images]
[im 4/97  soft-tissue]
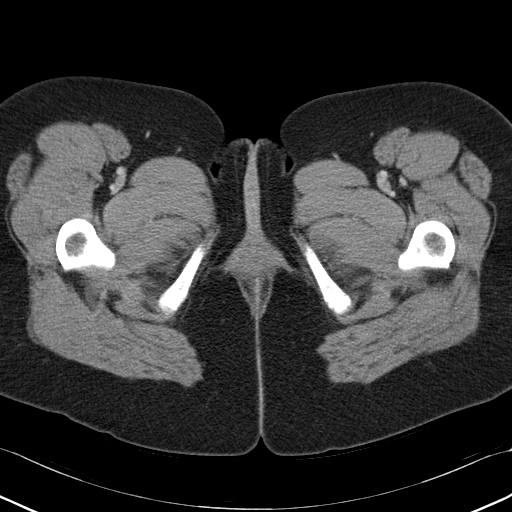
[im 4/97  bone]
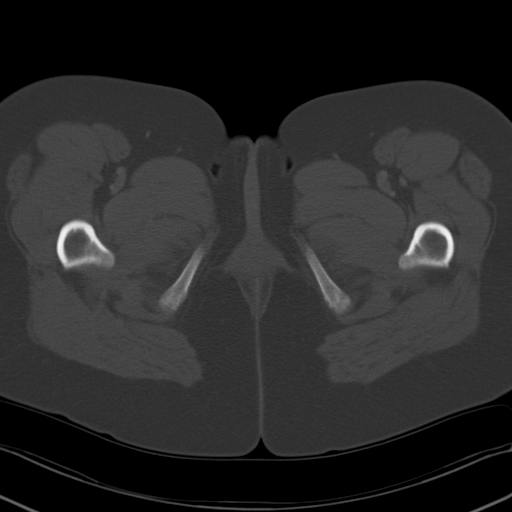
[im 12/97  soft-tissue]
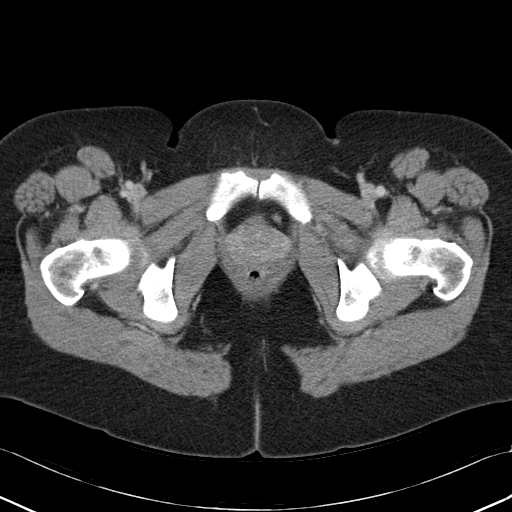
[im 20/97  soft-tissue]
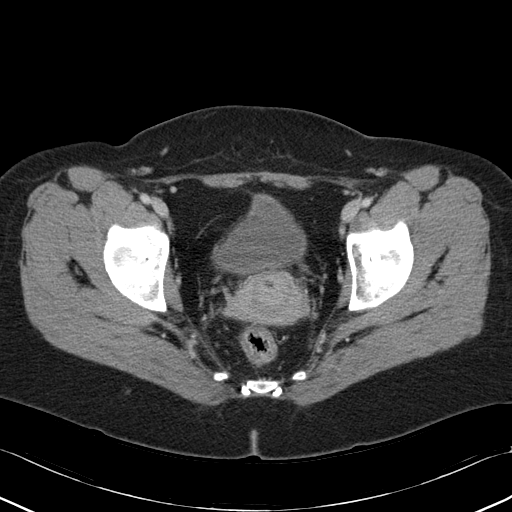
[im 27/97  soft-tissue]
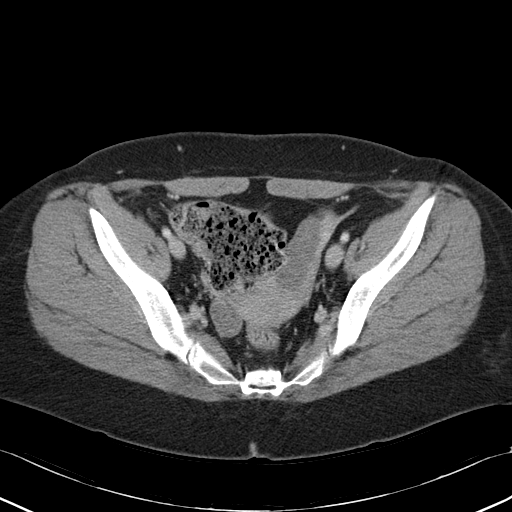
[im 35/97  soft-tissue]
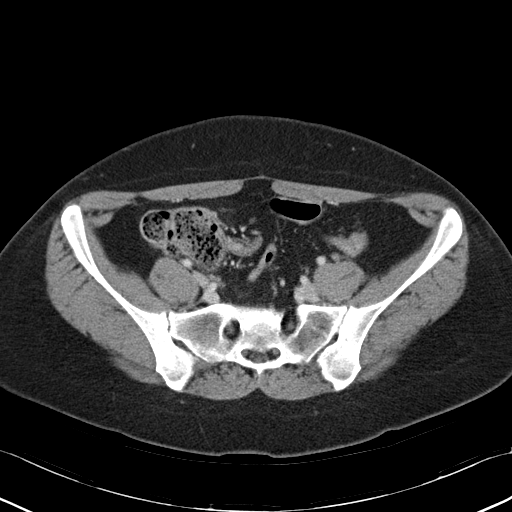
[im 43/97  soft-tissue]
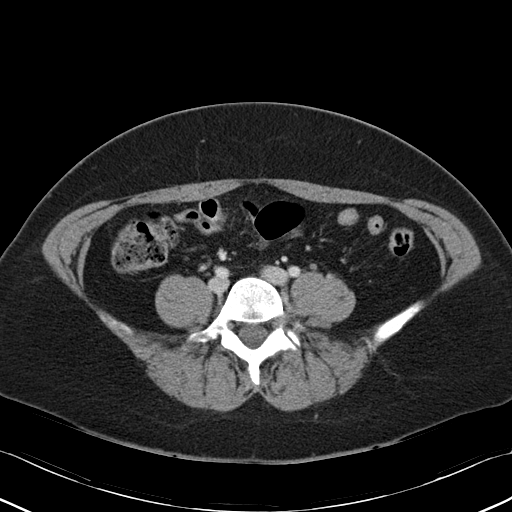
[im 50/97  soft-tissue]
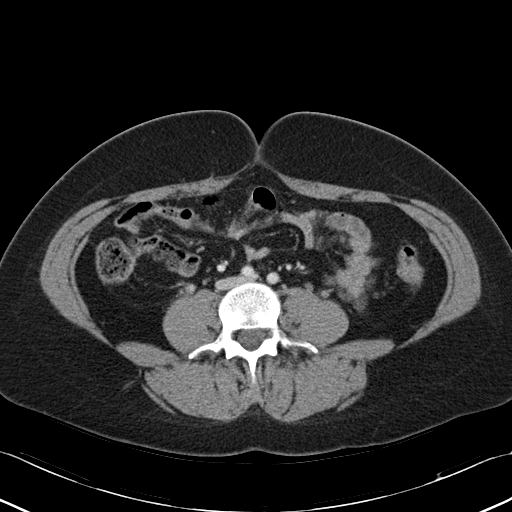
[im 54/97  soft-tissue]
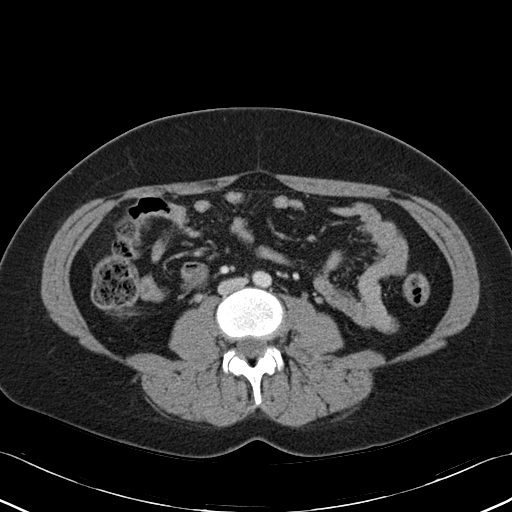
[im 62/97  soft-tissue]
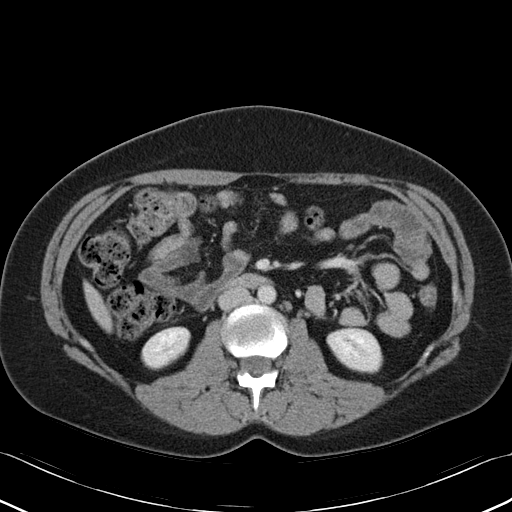
[im 62/97  bone]
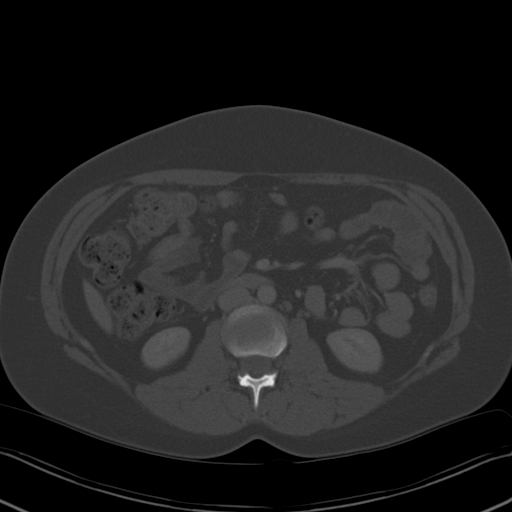
[im 70/97  soft-tissue]
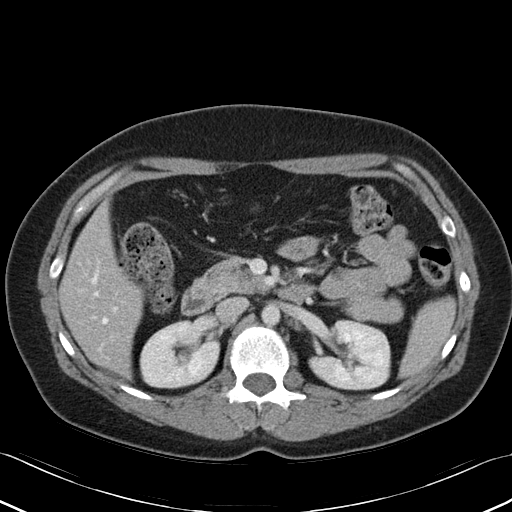
[im 77/97  soft-tissue]
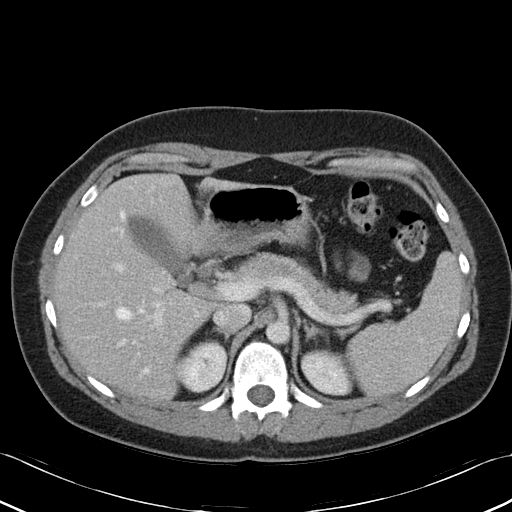
[im 85/97  soft-tissue]
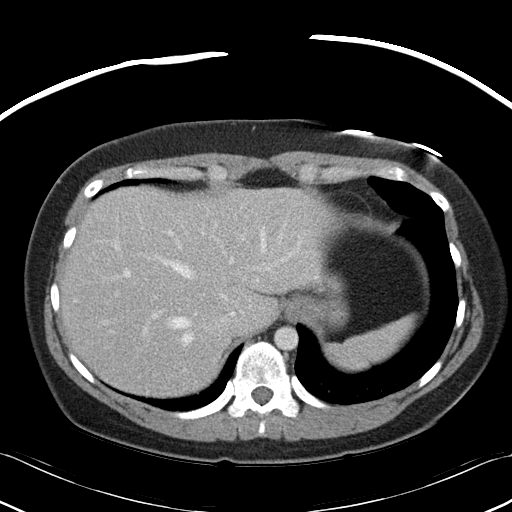
[im 93/97  soft-tissue]
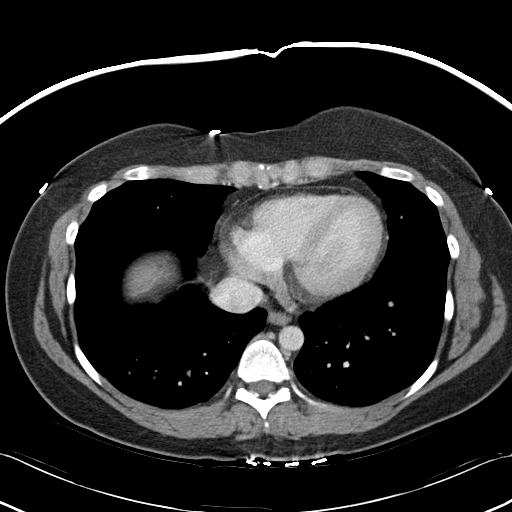

[Series 4: abd_pel_with 3.0 spo cor · coronal · 0.68mm/px · 3 of 77 slices shown]
[im 26/77  soft-tissue]
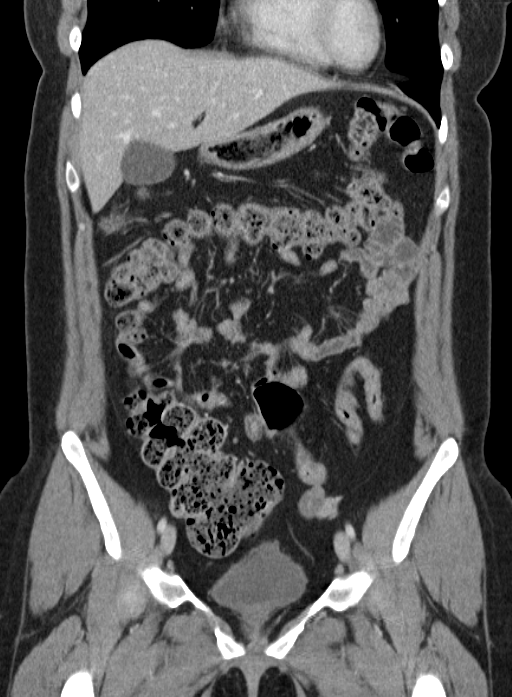
[im 34/77  soft-tissue]
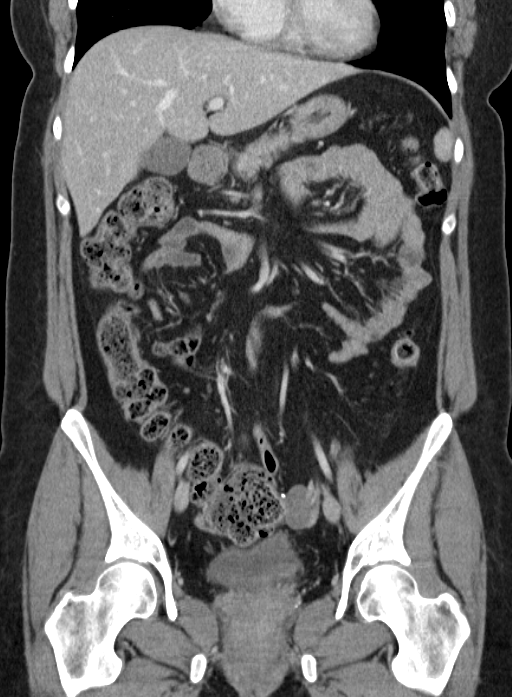
[im 43/77  soft-tissue]
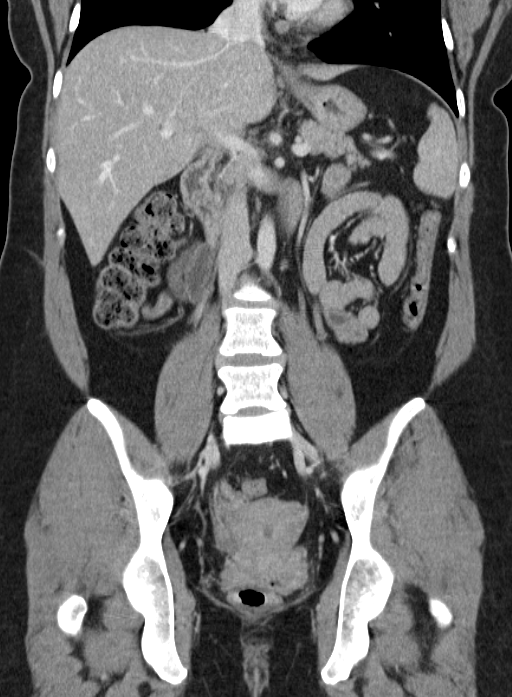

[16 of 46 positions shown; findings below may reference images not displayed]

FINDINGS: Dependent atelectasis at lung bases.
Questionable vague focus of low attenuation within right lobe
liver, 11 mm diameter.
Remainder of liver, spleen, pancreas, and adrenal glands normal
appearance.
Symmetric nephrograms with 2 tiny nonobstructing left renal
calculi.
No hydronephrosis, ureteral dilatation or ureteral stone.
Bladder only partially distended, unremarkable.
Left ovary slight prominent size versus right but stable.
Unremarkable uterus.

Bowel loops unopacified underdistended, with suboptimal assessment
of bowel wall thickness; no definite focal bowel abnormalities
identified.
Stomach decompressed.
No mass, adenopathy, free fluid or inflammatory process otherwise
seen.
Tiny umbilical hernia containing fat.
No acute osseous findings.
IMPRESSION: Two tiny nonobstructing left renal calculi.
Tiny umbilical hernia containing fat.
No other definite urinary tract abnormalities identified.
Questionable vague focus of low attenuation centrally in liver,
cannot exclude subtle mass; recommend follow-up MRI abdomen with
and without contrast to assess.

## 2017-01-06 ENCOUNTER — Emergency Department (HOSPITAL_COMMUNITY)
Admission: EM | Admit: 2017-01-06 | Discharge: 2017-01-06 | Disposition: A | Discharge status: Home / Self Care | Payer: Managed Care, Other (non HMO) | Attending: Emergency Medicine | Admitting: Emergency Medicine

## 2017-01-06 ENCOUNTER — Emergency Department (HOSPITAL_COMMUNITY): Payer: Managed Care, Other (non HMO)

## 2017-01-06 ENCOUNTER — Encounter (HOSPITAL_COMMUNITY): Payer: Self-pay | Admitting: *Deleted

## 2017-01-06 DIAGNOSIS — N3001 Acute cystitis with hematuria: Secondary | ICD-10-CM

## 2017-01-06 DIAGNOSIS — Z79899 Other long term (current) drug therapy: Secondary | ICD-10-CM | POA: Insufficient documentation

## 2017-01-06 DIAGNOSIS — R102 Pelvic and perineal pain: Secondary | ICD-10-CM | POA: Diagnosis present

## 2017-01-06 LAB — URINALYSIS, ROUTINE W REFLEX MICROSCOPIC
BACTERIA UA: NONE SEEN
BILIRUBIN URINE: NEGATIVE
Glucose, UA: NEGATIVE mg/dL
Ketones, ur: NEGATIVE mg/dL
NITRITE: NEGATIVE
Protein, ur: NEGATIVE mg/dL
Specific Gravity, Urine: 1.016 (ref 1.005–1.030)
pH: 6 (ref 5.0–8.0)

## 2017-01-06 LAB — PREGNANCY, URINE: PREG TEST UR: NEGATIVE

## 2017-01-06 MED ORDER — CYCLOBENZAPRINE HCL 5 MG PO TABS: 5.0000 mg | ORAL_TABLET | Freq: Three times a day (TID) | ORAL | 0 refills | Status: DC | PRN

## 2017-01-06 MED ORDER — METOCLOPRAMIDE HCL 5 MG/ML IJ SOLN
10.0000 mg | Freq: Once | INTRAMUSCULAR | Status: AC
  Administered 2017-01-06: 10 mg via INTRAVENOUS

## 2017-01-06 MED ORDER — ONDANSETRON HCL 4 MG/2ML IJ SOLN
4.0000 mg | Freq: Once | INTRAMUSCULAR | Status: AC
  Administered 2017-01-06: 4 mg via INTRAVENOUS
  Filled 2017-01-06: qty 2

## 2017-01-06 MED ORDER — METOCLOPRAMIDE HCL 5 MG/ML IJ SOLN
INTRAMUSCULAR | Status: AC
  Filled 2017-01-06: qty 2

## 2017-01-06 MED ORDER — FENTANYL CITRATE (PF) 100 MCG/2ML IJ SOLN
INTRAMUSCULAR | Status: AC
  Filled 2017-01-06: qty 2

## 2017-01-06 MED ORDER — CEPHALEXIN 500 MG PO CAPS: 500.0000 mg | ORAL_CAPSULE | Freq: Three times a day (TID) | ORAL | 0 refills | Status: DC

## 2017-01-06 MED ORDER — KETOROLAC TROMETHAMINE 30 MG/ML IJ SOLN
30.0000 mg | Freq: Once | INTRAMUSCULAR | Status: AC
  Administered 2017-01-06: 30 mg via INTRAVENOUS
  Filled 2017-01-06: qty 1

## 2017-01-06 MED ORDER — FENTANYL CITRATE (PF) 100 MCG/2ML IJ SOLN
50.0000 ug | Freq: Once | INTRAMUSCULAR | Status: AC
Start: 2017-01-06 — End: 2017-01-06
  Administered 2017-01-06: 50 ug via INTRAVENOUS

## 2017-01-06 MED ORDER — DEXTROSE 5 % IV SOLN
1.0000 g | Freq: Once | INTRAVENOUS | Status: AC
  Administered 2017-01-06: 1 g via INTRAVENOUS
  Filled 2017-01-06: qty 10

## 2017-01-06 MED ORDER — NAPROXEN 500 MG PO TABS: ORAL_TABLET | ORAL | 0 refills | Status: DC

## 2017-01-06 NOTE — ED Triage Notes (Signed)
Pt c/o right flank pain that started a couple of hours ago; pt is vomiting and states she is having same symptoms as when she had a kidney stone

## 2017-01-06 NOTE — ED Provider Notes (Signed)
[facility]Y DEPT Provider Note   CSN: [id number] Arrival date & time: 01/06/17  0038  Time seen 01:00 AM   History   Chief Complaint Chief Complaint  Patient presents with  . Flank Pain    HPI Amanda Rogers is a 36 y.o. female.  HPI  patient reports about 9:30 PM she started getting mild lower back pain bilaterally and pelvic pain. She states the pelvic pain is worse on the right. She states it started gradually and got worse. It is constant but waxes and wanes. She states after she urinates the pain gets worse, nothing makes it feel better. She has had nausea and vomited about 3 times. She denies diarrhea, hematuria, or fever. She denies any change in her activity. She states [place] at the end of June. She has a history kidney stones and states the last one she passed was about a year ago. She's never had to have an intervention to help her pass the stone. She also states she gets frequent urinary tract infections the last time was within the past year. She denies being a milk drinker but does drink about 4 diet sodas a day. She has a family history of kidney stones. She doesn't have a urologist at her home.  [facility] [place]  Past Medical History:  Diagnosis Date  . Anxiety   . Social anxiety disorder     Patient Active Problem List   Diagnosis Date Noted  . ABDOMINAL PAIN, LOWER 08/16/2007  . HEMATOCHEZIA, HX OF 08/16/2007    Past Surgical History:  Procedure Laterality Date  . APPENDECTOMY    . CESAREAN SECTION      OB History    No data available       Home Medications    Prior to Admission medications   Medication Sig Start Date End Date Taking? Authorizing Provider  escitalopram (LEXAPRO) 20 MG tablet Take 20 mg by mouth daily.   Yes [provider]  GABAPENTIN PO Take by mouth.   Yes [provider]  propantheline (PROBANTHINE) 15 MG tablet Take 15 mg by mouth 3 (three) times daily with meals.   Yes [provider]  cephALEXin (KEFLEX) 500 MG capsule Take 1 capsule (500 mg total) by mouth 3 (three) times daily. 01/06/17   Devoria AlbeKnapp, Ethelyn Cerniglia, MD  clonazePAM (KLONOPIN) 0.5 MG tablet Take 1 tablet (0.5 mg total) by mouth 2 (two) times daily as needed for anxiety. Patient taking differently: Take 1 mg by mouth 2 (two) times daily as needed for anxiety.  05/04/12   Hayden RasmussenMabe, David, NP  cyclobenzaprine (FLEXERIL) 5 MG tablet Take 1 tablet (5 mg total) by mouth 3 (three) times daily as needed. 01/06/17   Devoria AlbeKnapp, Sama Arauz, MD  levonorgestrel-ethinyl estradiol (AVIANE,ALESSE,LESSINA) 0.1-20 MG-MCG tablet Take 1 tablet by mouth daily.    [provider]  naproxen (NAPROSYN) 500 MG tablet Take 1 po BID with food prn pain 01/06/17   Devoria AlbeKnapp, Tasman Zapata, MD    Family History History reviewed. No pertinent family history.  Social History Social History  Substance Use Topics  . Smoking status: Never Smoker  . Smokeless tobacco: Never Used  . Alcohol use Yes     Allergies   Ultram [tramadol]   Review of Systems Review of Systems  All other systems reviewed and are negative.    Physical Exam Updated Vital Signs BP 134/66   Pulse 75   Temp 97.8 F (36.6 C) (Oral)   Resp 18  Ht 5\' 5"  (1.651 m)   Wt 92.1 kg (203 lb)   LMP 12/20/2016   SpO2 100%   BMI 33.78 kg/m   Vital signs normal    Physical Exam  Constitutional: She is oriented to person, place, and time. She appears well-developed and well-nourished.  Non-toxic appearance. She does not appear ill. She appears distressed.  Seems uncomfortable when she changes positions.  HENT:  Head: Normocephalic and atraumatic.  Right Ear: External ear normal.  Left Ear: External ear normal.  Nose: Nose normal. No mucosal edema or rhinorrhea.  Mouth/Throat: Oropharynx is clear and moist and mucous membranes are normal. No dental abscesses or uvula swelling.  Eyes: Pupils are equal, round, and reactive to light. Conjunctivae and EOM are normal.  Neck:  Normal range of motion and full passive range of motion without pain. Neck supple.  Cardiovascular: Normal rate, regular rhythm and normal heart sounds.  Exam reveals no gallop and no friction rub.   No murmur heard. Pulmonary/Chest: Effort normal and breath sounds normal. No respiratory distress. She has no wheezes. She has no rhonchi. She has no rales. She exhibits no tenderness and no crepitus.  Abdominal: Soft. Normal appearance and bowel sounds are normal. She exhibits no distension. There is tenderness. There is no rebound and no guarding.    Patient has diffuse abdominal pain but it's worse in the right lower quadrant.  Genitourinary:  Genitourinary Comments: Patient is tender bilaterally over the CVA but is worse on the right. She also seems painful when she tries to sit up or change positions.  Musculoskeletal: Normal range of motion. She exhibits no edema or tenderness.  Moves all extremities well.   Neurological: She is alert and oriented to person, place, and time. She has normal strength. No cranial nerve deficit.  Skin: Skin is warm, dry and intact. No rash noted. No erythema. No pallor.  Psychiatric: She has a normal mood and affect. Her speech is normal and behavior is normal. Her mood appears not anxious.  Nursing note and vitals reviewed.    ED Treatments / Results  Labs (all labs ordered are listed, but only abnormal results are displayed) Results for orders placed or performed during the hospital encounter of 01/06/17  Urinalysis, Routine w reflex microscopic- may I&O cath if menses  Result Value Ref Range   Color, Urine YELLOW YELLOW   APPearance HAZY (A) CLEAR   Specific Gravity, Urine 1.016 1.005 - 1.030   pH 6.0 5.0 - 8.0   Glucose, UA NEGATIVE NEGATIVE mg/dL   Hgb urine dipstick LARGE (A) NEGATIVE   Bilirubin Urine NEGATIVE NEGATIVE   Ketones, ur NEGATIVE NEGATIVE mg/dL   Protein, ur NEGATIVE NEGATIVE mg/dL   Nitrite NEGATIVE NEGATIVE   Leukocytes, UA SMALL  (A) NEGATIVE   RBC / HPF 6-30 0 - 5 RBC/hpf   WBC, UA 6-30 0 - 5 WBC/hpf   Bacteria, UA NONE SEEN NONE SEEN   Squamous Epithelial / LPF 0-5 (A) NONE SEEN  Pregnancy, urine  Result Value Ref Range   Preg Test, Ur NEGATIVE NEGATIVE   Laboratory interpretation all normal except Hematuria and leukocytosis without positive nitrite.    EKG  EKG Interpretation None       Radiology Ct Renal Stone Study  Result Date: 01/06/2017 CLINICAL DATA:  Right flank pain starting a few hours ago. Vomiting. EXAM: CT ABDOMEN AND PELVIS WITHOUT CONTRAST TECHNIQUE: Multidetector CT imaging of the abdomen and pelvis was performed following the standard protocol without IV contrast.  COMPARISON:  09/06/2010 FINDINGS: Lower chest: The included heart is normal in size. No pericardial effusion. Hepatobiliary: Hepatic steatosis. Focal fatty sparing about the gallbladder fossa. No biliary dilatation. Physiologic distention of the gallbladder without calculus. Pancreas: Negative Spleen: Negative.  Small splenule. Adrenals/Urinary Tract: Normal bilateral adrenal glands. Nonobstructing lower pole renal calculi on the left measuring 5 x 9 x 4 mm in aggregate. No hydroureteronephrosis. Normal bladder without calculus or mass. Stomach/Bowel: Appendectomy. Contracted stomach. Normal small bowel rotation without obstruction or inflammation. Moderate colonic fecal residue throughout large bowel to the level of the descending colon. Vascular/Lymphatic: No significant vascular findings are present. No enlarged abdominal or pelvic lymph nodes. Reproductive: Uterus and bilateral adnexa are unremarkable. Other: No abdominal wall hernia or abnormality. No abdominopelvic ascites. Musculoskeletal: No acute or significant osseous findings. IMPRESSION: 1. Left lower pole nonobstructing are renal calculi in aggregate measuring 5 x 9 x 4 mm. No hydroureteronephrosis. 2. Diffuse hepatic steatosis with focal fatty sparing at the gallbladder fossa.  3. Status post appendectomy. 4. No acute bowel inflammation or obstruction. Electronically Signed   By: Tollie Eth M.D.   On: 01/06/2017 02:42    Procedures Procedures (including critical care time)  Medications Ordered in ED Medications  ketorolac (TORADOL) 30 MG/ML injection 30 mg (30 mg Intravenous Given 01/06/17 0130)  ondansetron (ZOFRAN) injection 4 mg (4 mg Intravenous Given 01/06/17 0129)  cefTRIAXone (ROCEPHIN) 1 g in dextrose 5 % 50 mL IVPB (1 g Intravenous New Bag/Given 01/06/17 0206)  fentaNYL (SUBLIMAZE) injection 50 mcg (50 mcg Intravenous Given 01/06/17 0204)  metoCLOPramide (REGLAN) injection 10 mg (10 mg Intravenous Given 01/06/17 0205)     Initial Impression / Assessment and Plan / ED Course  I have reviewed the triage vital signs and the nursing notes.  Pertinent labs & imaging results that were available during my care of the patient were reviewed by me and considered in my medical decision making (see chart for details).  Patient was given IV Toradol for pain. After reviewing her urinalysis which is suspicious for possible UTI, she was given Rocephin 1 g IV and a urine culture was obtained. Renal CT scan was ordered to look to see if she's passing a kidney stone. Patient is status post appendectomy.  Nurse reports patient did not get relief after IV Toradol. She then was given fentanyl, and she states she was still having nausea so she was given Reglan IV for her pain and nausea.  Recheck at 4:25 AM patient states she's a little bit better. We discussed her test results. At this point her pain is worse on the right side however she only has kidney stones on the left and she has not passing any stones, no ureterolithiasis. She has a possible UTI and a urine culture was sent. She was given IV Rocephin. At this point it feel like her pain is most likely from a possible UTI and is musculoskeletal in origin.  Review of the [place] shows a prescription filled on  July 16 for #60 clonazepam 1 mg tablets written from her doctor in [place]. They were filled in [facility]. I am unable to access [place] controlled site.  Final Clinical Impressions(s) / ED Diagnoses   Final diagnoses:  Acute cystitis with hematuria     New Prescriptions New Prescriptions   CEPHALEXIN (KEFLEX) 500 MG CAPSULE    Take 1 capsule (500 mg total) by mouth 3 (three) times daily.   CYCLOBENZAPRINE (FLEXERIL) 5 MG TABLET  Take 1 tablet (5 mg total) by mouth 3 (three) times daily as needed.   NAPROXEN (NAPROSYN) 500 MG TABLET    Take 1 po BID with food prn pain    Plan discharge  Devoria Albe, MD, Concha Pyo, MD 01/06/17 [phone]

## 2017-01-06 NOTE — Discharge Instructions (Signed)
Drink plenty of fluids. Use ice and heat for comfort. Take the antibiotics until gone. Take the other medication for your musculoskeletal pain.  Recheck if you get a fever, have uncontrolled vomiting or worsening pain.

## 2017-01-08 LAB — URINE CULTURE: Culture: >=100000 — AB

## 2017-01-09 ENCOUNTER — Telehealth: Payer: Self-pay | Admitting: Emergency Medicine

## 2017-01-09 NOTE — Progress Notes (Signed)
ED Antimicrobial Stewardship Positive Culture Follow Up   Amanda Rogers is an 36 y.o. female who [facility] with a chief complaint of  Chief Complaint  Patient presents with  . Flank Pain    Recent Results (from the past 720 hour(s))  Urine culture     Status: Abnormal   Collection Time: 01/06/17  1:04 AM  Result Value Ref Range Status   Specimen Description URINE, CLEAN CATCH  Final   Special Requests NONE  Final   Culture >=100,000 COLONIES/mL ENTEROCOCCUS FAECALIS (A)  Final   Report Status 01/08/2017 FINAL  Final   Organism ID, Bacteria ENTEROCOCCUS FAECALIS (A)  Final      Susceptibility   Enterococcus faecalis - MIC*    AMPICILLIN <=2 SENSITIVE Sensitive     LEVOFLOXACIN 1 SENSITIVE Sensitive     NITROFURANTOIN <=16 SENSITIVE Sensitive     VANCOMYCIN 1 SENSITIVE Sensitive     * >=100,000 COLONIES/mL ENTEROCOCCUS FAECALIS    [x]  Treated with cephalexin, organism resistant to prescribed antimicrobial []  Patient discharged originally without antimicrobial agent and treatment is now indicated  New antibiotic prescription: Call Patient. If still symptomatic with back pain, Levofloxacin 750 mg daily for 5 days.   ED Provider: [place] Russo, PA-C  Della GooEmily S Sinclair , PharmD 01/09/2017, 9:48 AM PGY2 Infectious Diseases Pharmacy Resident  Phone# [phone]

## 2017-01-09 NOTE — Telephone Encounter (Signed)
Post ED Visit - Positive Culture Follow-up: Successful Patient Follow-Up  Culture assessed and recommendations reviewed by: []  Enzo BiNathan Batchelder, Pharm.D. []  Celedonio MiyamotoJeremy Frens, Pharm.D., BCPS AQ-ID []  Garvin FilaMike Maccia, Pharm.D., BCPS []  Georgina PillionElizabeth Martin, Pharm.D., BCPS []  WoodburnMinh Pham, VermontPharm.D., BCPS, AAHIVP []  Estella HuskMichelle Turner, Pharm.D., BCPS, AAHIVP []  Lysle Pearlachel Rumbarger, PharmD, BCPS []  Casilda Carlsaylor Stone, PharmD, BCPS []  Pollyann SamplesAndy Johnston, PharmD, BCPS Sharin MonsEmily Sinclair PharmD  Positive urine culture  []  Patient discharged without antimicrobial prescription and treatment is now indicated [x]  Organism is resistant to prescribed ED discharge antimicrobial []  Patient with positive blood cultures  Changes discussed with ED provider: [place] Russo PA New antibiotic prescription stop cephalexin, start Levofloxacin 750mg  po 1 tablet daily x 5 days [facility] [address] [place]  Contacted patient, 01/09/17 1108   Berle MullMiller, Pearle Wandler 01/09/2017, 11:06 AM

## 2019-05-26 IMAGING — CT CT RENAL STONE PROTOCOL
2 of 3 series · 16 of 46 positions shown, 18 images · non-contrast
Comparison: 09/06/2010

CLINICAL DATA: Right flank pain starting a few hours ago. Vomiting.

EXAM:
CT ABDOMEN AND PELVIS WITHOUT CONTRAST
TECHNIQUE: Multidetector CT imaging of the abdomen and pelvis was performed
following the standard protocol without IV contrast.

[Series 2: axial st · axial · 0.80mm/px · z∈[+845,+1330]mm · 13 of 113 slices shown, 15 images]
[im 8/113  soft-tissue]
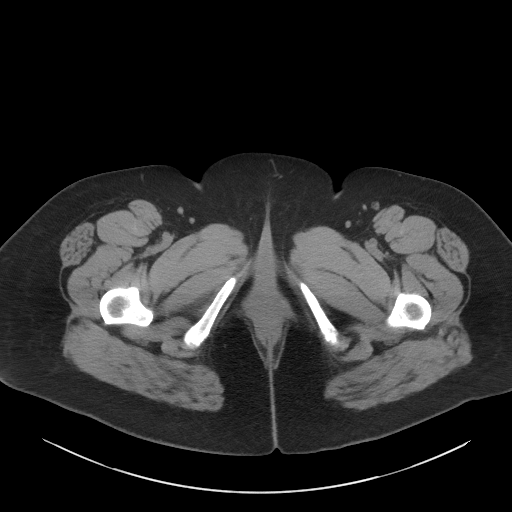
[im 8/113  bone]
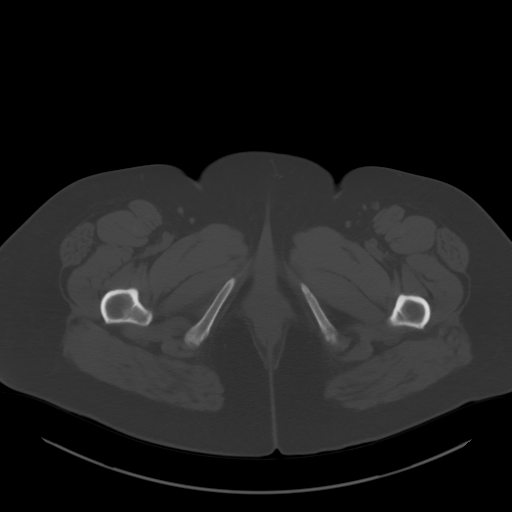
[im 15/113  soft-tissue]
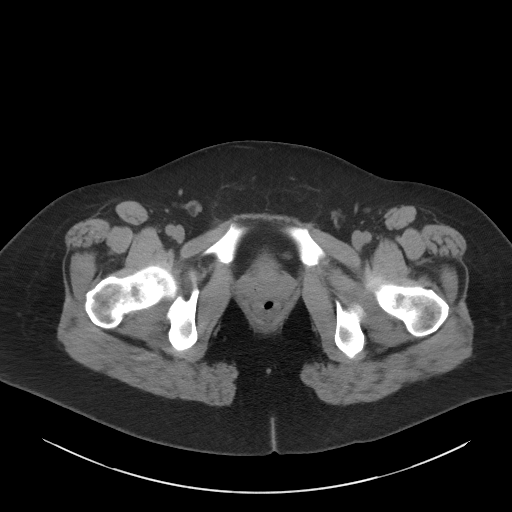
[im 22/113  soft-tissue]
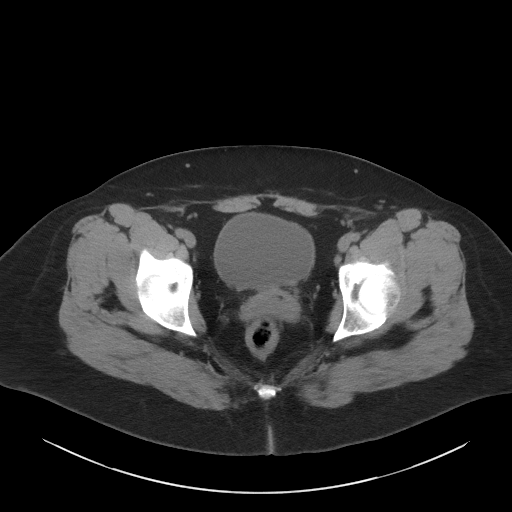
[im 33/113  soft-tissue]
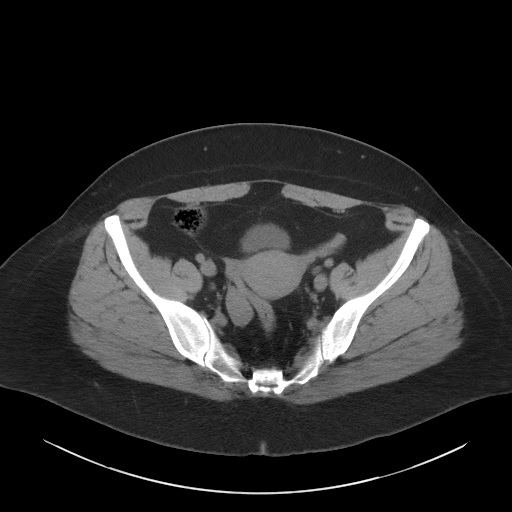
[im 40/113  soft-tissue]
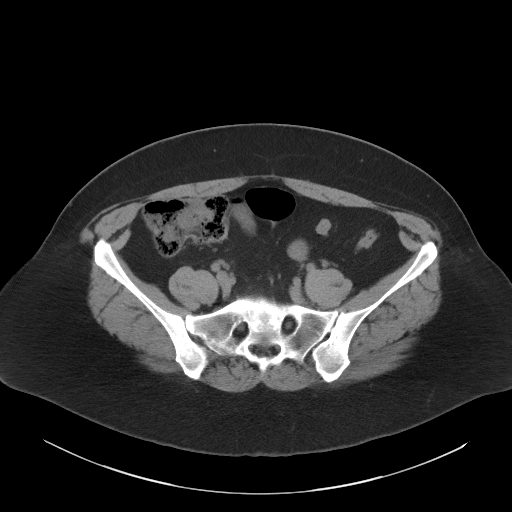
[im 47/113  soft-tissue]
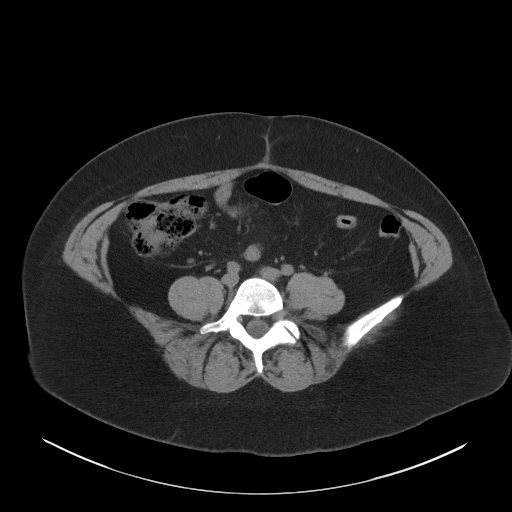
[im 58/113  soft-tissue]
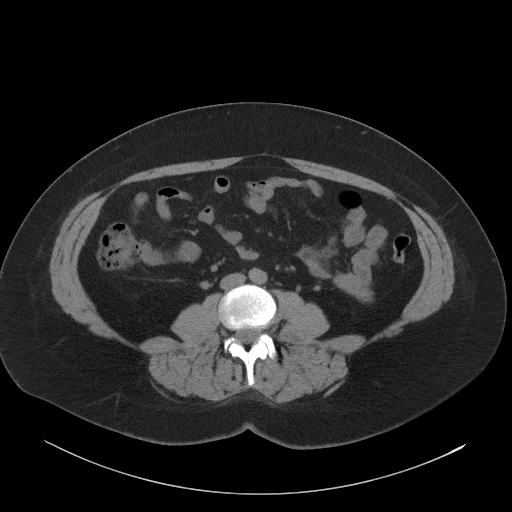
[im 66/113  soft-tissue]
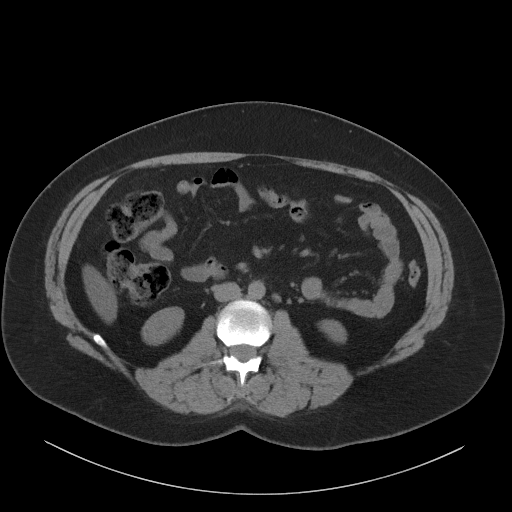
[im 73/113  soft-tissue]
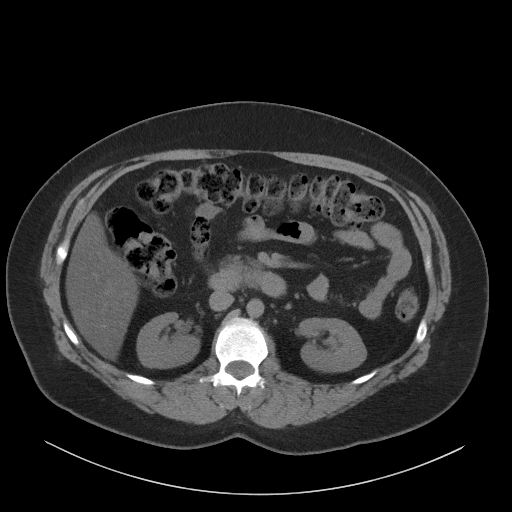
[im 73/113  bone]
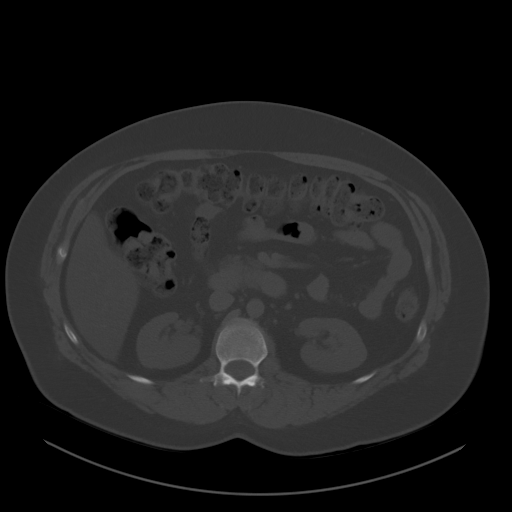
[im 80/113  soft-tissue]
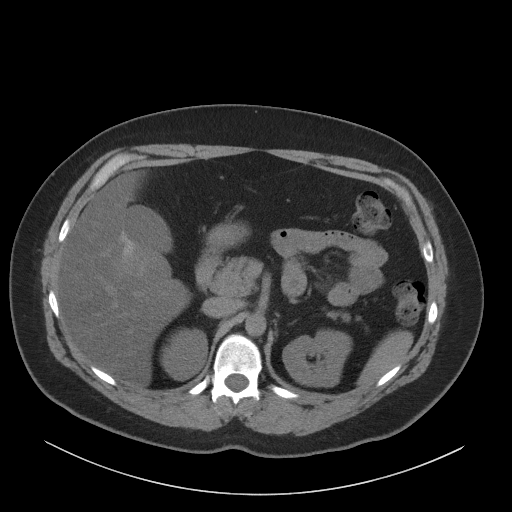
[im 91/113  soft-tissue]
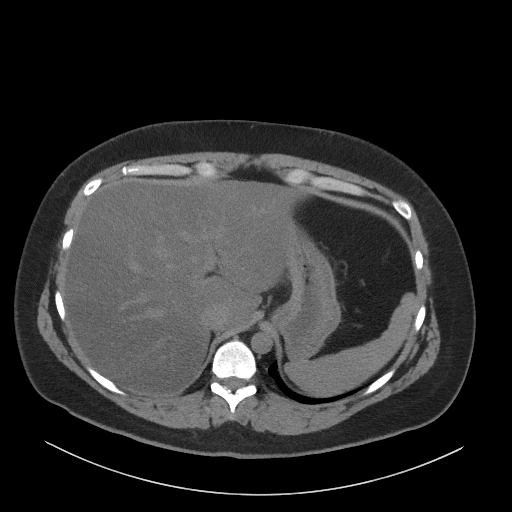
[im 98/113  soft-tissue]
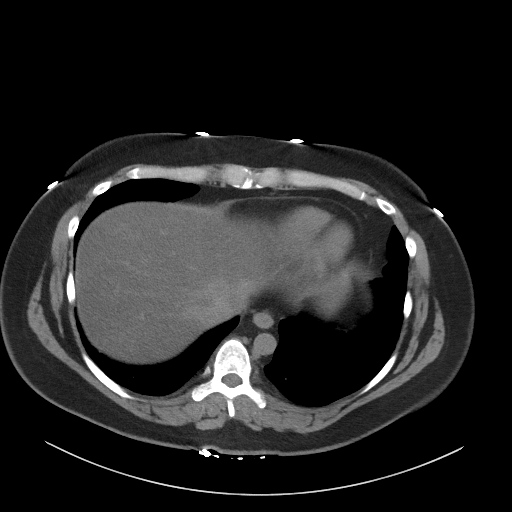
[im 105/113  soft-tissue]
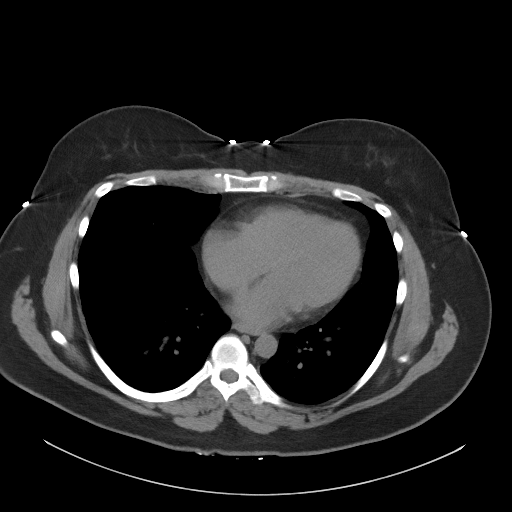

[Series 5: coronal st · coronal · 0.94mm/px · 3 of 101 slices shown]
[im 34/101  soft-tissue]
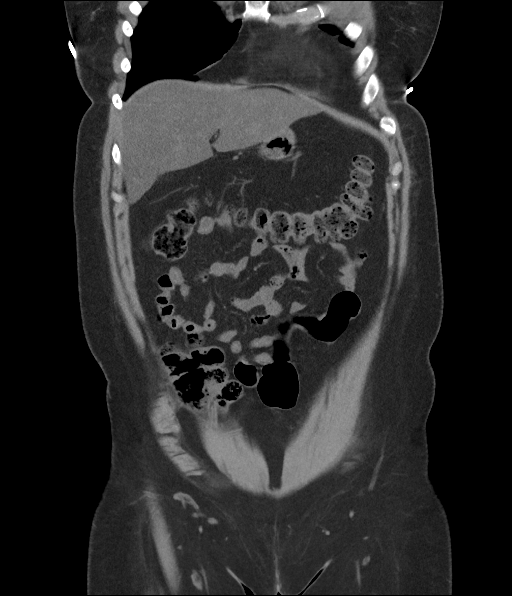
[im 45/101  soft-tissue]
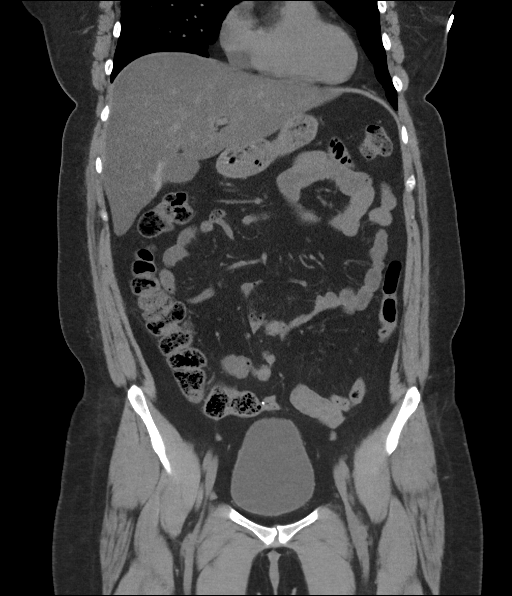
[im 56/101  soft-tissue]
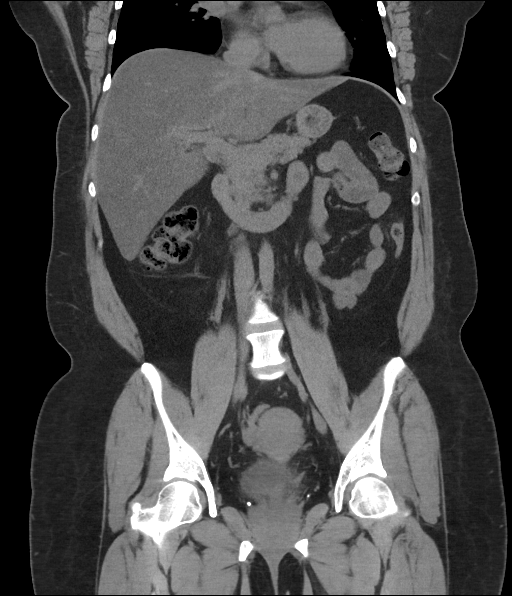

[16 of 46 positions shown; findings below may reference images not displayed]

FINDINGS: Lower chest: The included heart is normal in size. No pericardial
effusion.

Hepatobiliary: Hepatic steatosis. Focal fatty sparing about the
gallbladder fossa. No biliary dilatation. Physiologic distention of
the gallbladder without calculus.

Pancreas: Negative

Spleen: Negative.  Small splenule.

Adrenals/Urinary Tract: Normal bilateral adrenal glands.
Nonobstructing lower pole renal calculi on the left measuring 5 x 9
x 4 mm in aggregate. No hydroureteronephrosis. Normal bladder
without calculus or mass.

Stomach/Bowel: Appendectomy. Contracted stomach. Normal small bowel
rotation without obstruction or inflammation. Moderate colonic fecal
residue throughout large bowel to the level of the descending colon.

Vascular/Lymphatic: No significant vascular findings are present. No
enlarged abdominal or pelvic lymph nodes.

Reproductive: Uterus and bilateral adnexa are unremarkable.

Other: No abdominal wall hernia or abnormality. No abdominopelvic
ascites.

Musculoskeletal: No acute or significant osseous findings.
IMPRESSION: 1. Left lower pole nonobstructing are renal calculi in aggregate
measuring 5 x 9 x 4 mm. No hydroureteronephrosis.
2. Diffuse hepatic steatosis with focal fatty sparing at the
gallbladder fossa.
3. Status post appendectomy.
4. No acute bowel inflammation or obstruction.

## 2020-03-19 ENCOUNTER — Encounter: Payer: Self-pay | Admitting: Psychiatry

## 2020-03-19 ENCOUNTER — Other Ambulatory Visit: Payer: Self-pay

## 2020-03-19 ENCOUNTER — Ambulatory Visit (INDEPENDENT_AMBULATORY_CARE_PROVIDER_SITE_OTHER): Payer: 59 | Admitting: Psychiatry

## 2020-03-19 DIAGNOSIS — Z79899 Other long term (current) drug therapy: Secondary | ICD-10-CM

## 2020-03-19 DIAGNOSIS — F4010 Social phobia, unspecified: Secondary | ICD-10-CM | POA: Diagnosis not present

## 2020-03-19 DIAGNOSIS — F32A Depression, unspecified: Secondary | ICD-10-CM

## 2020-03-19 DIAGNOSIS — F4001 Agoraphobia with panic disorder: Secondary | ICD-10-CM | POA: Diagnosis not present

## 2020-03-19 MED ORDER — CLONAZEPAM 0.5 MG PO TABS: ORAL_TABLET | ORAL | 0 refills | Status: DC
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
## 2020-03-25 LAB — NORTRIPTYLINE LEVEL: Nortriptyline Lvl: 200 mcg/L — ABNORMAL HIGH (ref 50–150)

## 2020-04-05 ENCOUNTER — Other Ambulatory Visit: Payer: Self-pay | Admitting: Psychiatry

## 2020-04-05 DIAGNOSIS — F4001 Agoraphobia with panic disorder: Secondary | ICD-10-CM

## 2020-04-05 NOTE — Telephone Encounter (Signed)
Due to Shanda Bumps being out of the office this week, Dr. Jennelle Human will review

## 2020-04-05 NOTE — Telephone Encounter (Signed)
Pt called and said that she needs a refill on her klonopin. She is taking more than prescribed and will be out in less than  11 pills. She is taking like 2 a day. Please send in a new script to the [facility] at [address] in [place]. She has an appt on 11/8 th. Please call her at [phone]

## 2020-04-12 MED ORDER — CLONAZEPAM 0.5 MG PO TABS: ORAL_TABLET | ORAL | 0 refills | Status: DC

## 2020-04-12 NOTE — Telephone Encounter (Signed)
Left message Rx sent to her pharmacy and to call back with concerns.

## 2020-04-12 NOTE — Addendum Note (Signed)
Addended by: Derenda Mis on: 04/12/2020 01:01 PM   Modules accepted: Orders

## 2020-04-15 ENCOUNTER — Ambulatory Visit: Payer: 59 | Admitting: Psychiatry

## 2020-04-22 ENCOUNTER — Encounter: Payer: Self-pay | Admitting: Psychiatry

## 2020-04-22 ENCOUNTER — Ambulatory Visit (INDEPENDENT_AMBULATORY_CARE_PROVIDER_SITE_OTHER): Payer: 59 | Admitting: Psychiatry

## 2020-04-22 ENCOUNTER — Other Ambulatory Visit: Payer: Self-pay

## 2020-04-22 DIAGNOSIS — F4001 Agoraphobia with panic disorder: Secondary | ICD-10-CM
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
## 2020-04-26 ENCOUNTER — Encounter: Payer: Self-pay | Admitting: Psychiatry

## 2020-04-26 ENCOUNTER — Other Ambulatory Visit: Payer: Self-pay

## 2020-04-26 ENCOUNTER — Ambulatory Visit (INDEPENDENT_AMBULATORY_CARE_PROVIDER_SITE_OTHER): Payer: 59 | Admitting: Psychiatry

## 2020-04-26 DIAGNOSIS — F4010 Social phobia, unspecified: Secondary | ICD-10-CM

## 2020-04-26 DIAGNOSIS — F32A Depression, unspecified: Secondary | ICD-10-CM

## 2020-04-26 DIAGNOSIS — F4001 Agoraphobia with panic disorder: Secondary | ICD-10-CM

## 2020-04-26 MED ORDER — NORTRIPTYLINE HCL 50 MG PO CAPS: 50.0000 mg | ORAL_CAPSULE | Freq: Two times a day (BID) | ORAL | Status: DC

## 2020-04-26 MED ORDER — CLONAZEPAM 1 MG PO TABS: ORAL_TABLET | ORAL | 1 refills | Status: DC

## 2020-04-26 NOTE — Progress Notes (Signed)
Amanda Rogers [id number] Nov 20, 1980 39 y.o.  Subjective:   Patient ID:  Amanda Rogers is a 39 y.o. (DOB Feb 01, 1981) female.  Chief Complaint:  Chief Complaint  Patient presents with   Anxiety   Depression    HPI Amanda Rogers presents to the office today for follow-up of anxiety and depression. She reports that she was having to take more Klonopin. Reports that she has been taking 2-3 mg of Klonopin daily. Typically taking Klonopin around 6 am, 12 noon, and again in the evening. She reports that she typically needs 1 mg to be effective. Has been trying to get out and do more recently. Recently went to the [facility] for a short trip. Was able to attend niece's birthday party with medication. She reports that she had increased anxiety after father exhibited agitation. She reports that panic attacks have not been as bad and is able to step away when she feels anxiety starting to build. She snapped at her daughter and started crying when they were in a crowded situation and there was multiple stimuli, which is not typical for her. She reports that intrusive thoughts have been decreased and continues to have some when driving. Has been able to get out of her house more. Has been able to go to 2 stores alone and had significant anxiety in one store. Some catastrophic thinking. Continued checking behaviors. Some intrusive memories. Denies nightmares. Notices hypervigilance with her daughter. She reports that her sleep has been "ok." Some occ middle of the night awakenings, particularly when husband is working nights. Has not been binging as much over the last 1-2 weeks. Energy has been good. Motivation have been improving. Feels that she is getting back into a routine. Concentration has been adequate. Denies SI.   Denies any recent spontaneous moods. Denies any recent impulsive or risky behavior.   She reports that her mood has been good. She reports that she is now texting people back  immediately instead of putting it off or getting reassurance first. Has been posting pictures again that she has taken. She reports that mood has been less sad and now occurs mostly when she is thinking about family stressors.   Saw Lina Sayre, [facility] for initial evaluation recently.   Husband will change shifts soon from 1pm- 11 pm and that this may help with her sleep.    Past Psychiatric Medication Trials: Lexapro- Initially seemed to be helpful and took prior to pregnancy with daughter and re-started it when daughter was about 33 yo and took until August 2020. Zoloft- Not effective. May have felt "out of it." Prozac- Helped some with mood and then was no longer as effective.  Wellbutrin- Not effective Buspar- Felt warm and somewhat nauseated Hydroxyzine- Caused drowsiness and did not help with anxiety Nortriptyline- Has been helpful for her mood  Gabapentin- Felt disconnected with taking 300 mg BID and 600 mg QHS. Has been helpful for anxiety.  Klonopin- Was taking 0.25 mg prn and needing to take more since move to [place] Klonopin ODT Xanax XR  Review of Systems:  Review of Systems  Cardiovascular: Negative for palpitations.  Gastrointestinal:       Recent IBS flare  Musculoskeletal: Negative for gait problem.  Neurological: Negative for tremors.  Psychiatric/Behavioral:       Please refer to HPI    Medications: I have reviewed the patient's current medications.  Current Outpatient Medications  Medication Sig Dispense Refill   clonazePAM (KLONOPIN) 1 MG tablet Take  1/2-1 tab po TID prn anxiety 90 tablet 1   gabapentin (NEURONTIN) 300 MG capsule Take by mouth in the morning, at noon, and at bedtime.      Melatonin 10 MG TABS Take by mouth at bedtime as needed.      nortriptyline (PAMELOR) 50 MG capsule Take 1 capsule (50 mg total) by mouth 2 (two) times daily.     propranolol (INDERAL) 40 MG tablet Take 40 mg by mouth 3 (three) times daily.     No current  facility-administered medications for this visit.    Medication Side Effects: Other: Feels "foggy" with increased amount of Gabapentin  Allergies:  Allergies  Allergen Reactions   Ultram [Tramadol] Rash    Past Medical History:  Diagnosis Date   Anxiety    Back pain    IBS (irritable bowel syndrome)    Social anxiety disorder     Family History  Problem Relation Age of Onset   Anxiety disorder Sister    Depression Sister     Social History   Socioeconomic History   Marital status: Married    Spouse name: Not on file   Number of children: Not on file   Years of education: Not on file   Highest education level: Not on file  Occupational History   Not on file  Tobacco Use   Smoking status: Former Smoker   Smokeless tobacco: Never Used  Scientific laboratory technician Use: Every day  Substance and Sexual Activity   Alcohol use: Not Currently   Drug use: No   Sexual activity: Yes    Birth control/protection: Pill  Other Topics Concern   Not on file  Social History Narrative   Not on file   Social Determinants of Health   Financial Resource Strain:    Difficulty of Paying Living Expenses: Not on file  Food Insecurity:    Worried About Charity fundraiser in the Last Year: Not on file   [employer] of Food in the Last Year: Not on file  Transportation Needs:    Lack of Transportation (Medical): Not on file   Lack of Transportation (Non-Medical): Not on file  Physical Activity:    Days of Exercise per Week: Not on file   Minutes of Exercise per Session: Not on file  Stress:    Feeling of Stress : Not on file  Social Connections:    Frequency of Communication with Friends and Family: Not on file   Frequency of Social Gatherings with Friends and Family: Not on file   Attends Religious Services: Not on file   Active Member of Clubs or Organizations: Not on file   Attends Archivist Meetings: Not on file   Marital Status: Not on  file  Intimate Partner Violence:    Fear of Current or Ex-Partner: Not on file   Emotionally Abused: Not on file   Physically Abused: Not on file   Sexually Abused: Not on file    Past Medical History, Surgical history, Social history, and Family history were reviewed and updated as appropriate.   Please see review of systems for further details on the patient's review from today.   Objective:   Physical Exam:  There were no vitals taken for this visit.  Physical Exam Constitutional:      General: She is not in acute distress. Musculoskeletal:        General: No deformity.  Neurological:     Mental Status: She is alert  and oriented to person, place, and time.     Coordination: Coordination normal.  Psychiatric:        Attention and Perception: Attention and perception normal. She does not perceive auditory or visual hallucinations.        Mood and Affect: Mood is anxious and depressed. Affect is not labile, blunt, angry or inappropriate.        Speech: Speech normal.        Behavior: Behavior normal.        Thought Content: Thought content normal. Thought content is not paranoid or delusional. Thought content does not include homicidal or suicidal ideation. Thought content does not include homicidal or suicidal plan.        Cognition and Memory: Cognition and memory normal.        Judgment: Judgment normal.     Comments: Insight intact Mood presents as less depressed and less anxious compared to initial evaluation.      Lab Review:     Component Value Date/Time   NA 137 06/30/2010 1151   K 4.1 06/30/2010 1151   CL 101 06/30/2010 1151   CO2 25 06/30/2010 1151   GLUCOSE 88 06/30/2010 1151   BUN 12 06/30/2010 1151   CREATININE 0.85 06/30/2010 1151   CALCIUM 9.6 06/30/2010 1151   PROT 6.8 02/26/2007 1243   ALBUMIN 4.2 02/26/2007 1243   AST 16 02/26/2007 1243   ALT 9 02/26/2007 1243   ALKPHOS 33 (L) 02/26/2007 1243   BILITOT 0.9 02/26/2007 1243   GFRNONAA >60  06/30/2010 1151   GFRAA  06/30/2010 1151    >60        The eGFR has been calculated using the MDRD equation. This calculation has not been validated in all clinical situations. eGFR's persistently <60 mL/min signify possible Chronic Kidney Disease.       Component Value Date/Time   WBC 9.1 04/05/2010 1517   RBC 4.47 04/05/2010 1517   HGB 13.9 06/30/2010 1151   HCT 39.2 06/30/2010 1151   PLT 203 04/05/2010 1517   MCV 91.8 04/05/2010 1517   MCH 31.6 04/05/2010 1517   MCHC 34.5 04/05/2010 1517   RDW 13.4 04/05/2010 1517   LYMPHSABS 2.3 04/05/2010 1517   MONOABS 0.6 04/05/2010 1517   EOSABS 0.2 04/05/2010 1517   BASOSABS 0.0 04/05/2010 1517    No results found for: POCLITH, LITHIUM   No results found for: PHENYTOIN, PHENOBARB, VALPROATE, CBMZ   .res Assessment: Plan:   Case staffed with Dr. Clovis Pu.  Will decrease Nortriptyline to 50 mg po BID due to recent elevated Nortriptyline level.  Discussed potential benefits, risks, and side effects of Lithium for augmentation of depression and discussed that frequent lab monitoring is typically not required for low dose Lithium. Pt reports that she would prefer to start with decrease in Nortriptyline and then consider adding Lithium if depression worsens since she has noticed some recent improvement in depressive s/s.  Will write script for Klonopin 1 mg 1/2-1 tab po TID prn anxiety. Discussed using Klonopin to help manage anxiety s/s while doing trauma therapy with plan to reduce in the future once anxiety improves.  Continue Propranolol 40 mg po TID for anxiety.  Continue Gabapentin 300 mg po TID for anxiety.  Recommend continuing therapy with Lina Sayre, [facility].  Pt to follow-up in 4-6 weeks or sooner if clinically indicated.  Patient advised to contact office with any questions, adverse effects, or acute worsening in signs and symptoms.    Amanda Rogers was  seen today for anxiety and depression.  Diagnoses and all orders for this  visit:  Panic disorder with agoraphobia -     clonazePAM (KLONOPIN) 1 MG tablet; Take 1/2-1 tab po TID prn anxiety  Depression, unspecified depression type -     nortriptyline (PAMELOR) 50 MG capsule; Take 1 capsule (50 mg total) by mouth 2 (two) times daily.  Social anxiety disorder     Please see After Visit Summary for patient specific instructions.  Future Appointments  Date Time Provider [place]  05/28/2020 11:00 AM Lina Sayre, [facility] CP-CP None  06/01/2020 11:30 AM Thayer Headings, [facility] CP-CP None    No orders of the defined types were placed in this encounter.   -------------------------------

## 2020-05-28 ENCOUNTER — Ambulatory Visit (INDEPENDENT_AMBULATORY_CARE_PROVIDER_SITE_OTHER): Payer: 59 | Admitting: Psychiatry

## 2020-05-28 ENCOUNTER — Other Ambulatory Visit: Payer: Self-pay

## 2020-05-28 DIAGNOSIS — F4001 Agoraphobia with panic disorder: Secondary | ICD-10-CM | POA: Diagnosis not present

## 2020-05-28 NOTE — Progress Notes (Signed)
      [facility] Counselor/Therapist Progress Note  Patient ID: ALLONA GONDEK, MRN: [id number],    Date: 05/28/2020  Time Spent: 50 minutes start time 11:06 AM end time 11:56 AM  Treatment Type: Individual Therapy  Reported Symptoms: panic, anxiety, crying spell, intrusive thoughts, low motivation  Mental Status Exam:  Appearance:   Well Groomed     Behavior:  Appropriate  Motor:  fidgety  Speech/Language:   Normal Rate  Affect:  Appropriate  Mood:  anxious  Thought process:  normal  Thought content:    WNL  Sensory/Perceptual disturbances:    WNL  Orientation:  oriented to person, place, time/date and situation  Attention:  Good  Concentration:  Good  Memory:  WNL  Fund of knowledge:   Good  Insight:    Good  Judgment:   Good  Impulse Control:  Good   Risk Assessment: Danger to Self:  No Self-injurious Behavior: No Danger to Others: No Duty to Warn:no Physical Aggression / Violence:No  Access to Firearms a concern: No  Gang Involvement:No   Subjective: Patient was present for session.  She shared she was in the hospital for a few days due to Kidney infection.  She is still having issues and working with a urologist on the issue.  She stated she survived Thanksgiving which was good.  She had some panic and had to step out a few times but did well overall.  She shared that she is pulling away from her dad because his health is getting really bad.  She is thinking this will be the last Christmas with him.  Patient shared she is having anxiety about the weekend due to her daughter having a play date and being worried that she will be judged. Discussed the plan to invite her husband and the lady's husband so she can feel more comfortable.  She is also having a mother of 1 of her daughter's friend asking for her to get coffee and she is having lots of anxiety about it.  Developed treatment plan and goals with patient.  Could not sign due to COVID 19 and need for social  distancing. Taught patient grounded and 5 exercise as well as had her practice safe place visualization.  Patient responded well to the exercises and agreed to practice them over the next week.  Interventions: Solution-Oriented/Positive Psychology  Diagnosis:   ICD-10-CM   1. Panic disorder with agoraphobia  F40.01     Plan: Patient is to practice coping skills that were taught in session.  Patient is to follow plan to her husband come with her to the play date.  Patient is to spend extra time with her dad while it is not overwhelming and make sure she takes lots of pictures for she and her daughter. Long-term goal: Resolve the core conflict that is a source of anxiety-reduce panic by 50% Short-term goal: Identify the major life conflicts in the past and present the form the basis for present anxiety   Stevphen Meuse, [facility]

## 2020-06-01 ENCOUNTER — Other Ambulatory Visit: Payer: Self-pay

## 2020-06-01 ENCOUNTER — Encounter: Payer: Self-pay | Admitting: Psychiatry

## 2020-06-01 ENCOUNTER — Ambulatory Visit (INDEPENDENT_AMBULATORY_CARE_PROVIDER_SITE_OTHER): Payer: 59 | Admitting: Psychiatry

## 2020-06-01 DIAGNOSIS — F32A Depression, unspecified: Secondary | ICD-10-CM | POA: Diagnosis not present

## 2020-06-01 DIAGNOSIS — F4001 Agoraphobia with panic disorder: Secondary | ICD-10-CM

## 2020-06-01 MED ORDER — LITHIUM CARBONATE 150 MG PO CAPS
ORAL_CAPSULE | ORAL | 2 refills | Status: DC
Start: 2020-06-01 — End: 2020-07-22

## 2020-06-01 MED ORDER — CLONAZEPAM 1 MG PO TABS: ORAL_TABLET | ORAL | 1 refills | Status: DC

## 2020-06-01 NOTE — Progress Notes (Signed)
Amanda Rogers [id number] 05-22-1981 39 y.o.  Subjective:   Patient ID:  Amanda Rogers is a 39 y.o. (DOB [birth year]) female.  Chief Complaint:  Chief Complaint  Patient presents with  . Anxiety  . Depression    HPI Amanda Rogers presents to the office today for follow-up of depression and anxiety. She reports that she was hospitalized last month for a kidney infection. She has a panic attack in the ER when there was confusion about whether she was being admitted or not. Has h/o recurrent UTI's. She reports that she is feeling better. She continues to experience low energy and motivation. Denies persistent sadness. Reports periods of sadness. She reports that she feels "left out of things". She reports that she is sleeping well with Melatonin prn about 3 times a week. Appetite has been good. She reports that she has only binged a couple of times. Concentration and focus is adequate. Occasional distractibility in conversations due to anxiety. Denies anhedonia. Looking forward to Christmas and holidays. Denies SI.    She reports that her panic s/s have been "better than what it was." She reports that she has had some worsening in other anxiety s/s and is unsure if this is related to decrease in Nortriptyline, holidays, or other factors. She has noticed some intrusive thoughts. She reports that she has had some increased catastrophic thoughts and anxiety around some interactions. Continues to have anxiety about being around   She reports that she had some difficulties over Thanksgiving with seeing significant decline in father compared to past holidays. She reports that she has this "intense fear that this is his last Christmas." Daughter has a winter Paramedic and has some anticipatory anxiety about this. She reports that she has been getting out of the house a little more and tries to do shopping in the morning when stores are not as busy.   Taking Klonopin 1 mg TID.   Past  Psychiatric Medication Trials: Lexapro- Initially seemed to be helpful and took prior to pregnancy with daughter and re-started it when daughter was about 77 yo and took until August 2020. Zoloft- Not effective. May have felt "out of it." Prozac- Helped some with mood and then was no longer as effective.  Wellbutrin- Not effective Buspar- Felt warm and somewhat nauseated Hydroxyzine- Caused drowsiness and did not help with anxiety Nortriptyline- Has been helpful for her mood  Gabapentin- Felt disconnected with taking 300 mg BID and 600 mg QHS. Has been helpful for anxiety.  Klonopin- Was taking 0.25 mg prn and needing to take more since move to [place] Klonopin ODT Xanax XR   Review of Systems:  Review of Systems  Gastrointestinal: Negative.   Genitourinary:       Scheduled for urodynamic study and cystoscopy  Musculoskeletal: Negative for gait problem.  Neurological: Negative for tremors.  Psychiatric/Behavioral:       Please refer to HPI    Medications: I have reviewed the patient's current medications.  Current Outpatient Medications  Medication Sig Dispense Refill  . gabapentin (NEURONTIN) 300 MG capsule Take by mouth in the morning, at noon, and at bedtime.     . Melatonin 10 MG TABS Take by mouth at bedtime as needed.     . nortriptyline (PAMELOR) 50 MG capsule Take 1 capsule (50 mg total) by mouth 2 (two) times daily.    . propranolol (INDERAL) 40 MG tablet Take 40 mg by mouth 3 (three) times daily.    . SENNA PO  Take by mouth.    Marland Kitchen VITAMIN D PO Take by mouth.    Derrill Memo ON 06/16/2020] clonazePAM (KLONOPIN) 1 MG tablet Take 1/2-1 tab po TID prn anxiety 90 tablet 1  . lithium carbonate 150 MG capsule Take 1 capsule po QHS x 3-5 days, then increase to 2 capsules po QHS 60 capsule 2   No current facility-administered medications for this visit.    Medication Side Effects: None  Allergies:  Allergies  Allergen Reactions  . Ultram [Tramadol] Rash    Past Medical  History:  Diagnosis Date  . Anxiety   . Back pain   . IBS (irritable bowel syndrome)   . Social anxiety disorder     Family History  Problem Relation Age of Onset  . Anxiety disorder Sister   . Depression Sister     Social History   Socioeconomic History  . Marital status: Married    Spouse name: Not on file  . Number of children: Not on file  . Years of education: Not on file  . Highest education level: Not on file  Occupational History  . Not on file  Tobacco Use  . Smoking status: Former Research scientist (life sciences)  . Smokeless tobacco: Never Used  Vaping Use  . Vaping Use: Every day  Substance and Sexual Activity  . Alcohol use: Not Currently  . Drug use: No  . Sexual activity: Yes    Birth control/protection: Pill  Other Topics Concern  . Not on file  Social History Narrative  . Not on file   Social Determinants of Health   Financial Resource Strain: Not on file  Food Insecurity: Not on file  Transportation Needs: Not on file  Physical Activity: Not on file  Stress: Not on file  Social Connections: Not on file  Intimate Partner Violence: Not on file    Past Medical History, Surgical history, Social history, and Family history were reviewed and updated as appropriate.   Please see review of systems for further details on the patient's review from today.   Objective:   Physical Exam:  There were no vitals taken for this visit.  Physical Exam Constitutional:      General: She is not in acute distress. Musculoskeletal:        General: No deformity.  Neurological:     Mental Status: She is alert and oriented to person, place, and time.     Coordination: Coordination normal.  Psychiatric:        Attention and Perception: Attention and perception normal. She does not perceive auditory or visual hallucinations.        Mood and Affect: Mood is anxious. Mood is not depressed. Affect is not labile, blunt, angry or inappropriate.        Speech: Speech normal.         Behavior: Behavior normal.        Thought Content: Thought content normal. Thought content is not paranoid or delusional. Thought content does not include homicidal or suicidal ideation. Thought content does not include homicidal or suicidal plan.        Cognition and Memory: Cognition and memory normal.        Judgment: Judgment normal.     Comments: Insight intact     Lab Review:     Component Value Date/Time   NA 137 06/30/2010 1151   K 4.1 06/30/2010 1151   CL 101 06/30/2010 1151   CO2 25 06/30/2010 1151   GLUCOSE 88 06/30/2010 1151  BUN 12 06/30/2010 1151   CREATININE 0.85 06/30/2010 1151   CALCIUM 9.6 06/30/2010 1151   PROT 6.8 02/26/2007 1243   ALBUMIN 4.2 02/26/2007 1243   AST 16 02/26/2007 1243   ALT 9 02/26/2007 1243   ALKPHOS 33 (L) 02/26/2007 1243   BILITOT 0.9 02/26/2007 1243   GFRNONAA >60 06/30/2010 1151   GFRAA  06/30/2010 1151    >60        The eGFR has been calculated using the MDRD equation. This calculation has not been validated in all clinical situations. eGFR's persistently <60 mL/min signify possible Chronic Kidney Disease.       Component Value Date/Time   WBC 9.1 04/05/2010 1517   RBC 4.47 04/05/2010 1517   HGB 13.9 06/30/2010 1151   HCT 39.2 06/30/2010 1151   PLT 203 04/05/2010 1517   MCV 91.8 04/05/2010 1517   MCH 31.6 04/05/2010 1517   MCHC 34.5 04/05/2010 1517   RDW 13.4 04/05/2010 1517   LYMPHSABS 2.3 04/05/2010 1517   MONOABS 0.6 04/05/2010 1517   EOSABS 0.2 04/05/2010 1517   BASOSABS 0.0 04/05/2010 1517    No results found for: POCLITH, LITHIUM   No results found for: PHENYTOIN, PHENOBARB, VALPROATE, CBMZ   .res Assessment: Plan:   Patient reports that she would now like to start lithium as discussed during last visit.  Reviewed potential benefits, risks, and side effects of lithium to include potential risk of lithium affecting thyroid and renal function.  Discussed need for periodic lab monitoring to determine drug level  and to assess for potential adverse effects.   Will start lithium 150 mg at bedtime for 3 to 5 days, then increase to 2 capsules at bedtime for depression. Continue Klonopin 1 mg 1/2 to 1 tablet 3 times daily as needed for anxiety. Continue Nortriptyline 50 mg twice daily for depression and anxiety. Continue propanolol 40 mg 3 times daily for anxiety. Recommend continuing psychotherapy with Lina Sayre, [facility]. Patient to follow-up with this provider in 1 to 2 months or sooner if clinically indicated. Patient advised to contact office with any questions, adverse effects, or acute worsening in signs and symptoms.    Amanda Rogers was seen today for anxiety and depression.  Diagnoses and all orders for this visit:  Depression, unspecified depression type -     lithium carbonate 150 MG capsule; Take 1 capsule po QHS x 3-5 days, then increase to 2 capsules po QHS  Panic disorder with agoraphobia -     clonazePAM (KLONOPIN) 1 MG tablet; Take 1/2-1 tab po TID prn anxiety     Please see After Visit Summary for patient specific instructions.  Future Appointments  Date Time Provider [place]  07/15/2020 11:00 AM Lina Sayre, [facility] CP-CP None  07/22/2020 11:30 AM Thayer Headings, [facility] CP-CP None  08/04/2020 11:00 AM Lina Sayre, [facility] CP-CP None    No orders of the defined types were placed in this encounter.   -------------------------------

## 2020-07-15 ENCOUNTER — Other Ambulatory Visit: Payer: Self-pay

## 2020-07-15 ENCOUNTER — Ambulatory Visit (INDEPENDENT_AMBULATORY_CARE_PROVIDER_SITE_OTHER): Payer: 59 | Admitting: Psychiatry

## 2020-07-15 DIAGNOSIS — F4001 Agoraphobia with panic disorder: Secondary | ICD-10-CM
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
## 2020-07-22 ENCOUNTER — Ambulatory Visit (INDEPENDENT_AMBULATORY_CARE_PROVIDER_SITE_OTHER): Payer: 59 | Admitting: Psychiatry

## 2020-07-22 ENCOUNTER — Other Ambulatory Visit: Payer: Self-pay

## 2020-07-22 ENCOUNTER — Encounter: Payer: Self-pay | Admitting: Psychiatry

## 2020-07-22 DIAGNOSIS — F4001 Agoraphobia with panic disorder: Secondary | ICD-10-CM | POA: Diagnosis not present

## 2020-07-22 DIAGNOSIS — F32A Depression, unspecified: Secondary | ICD-10-CM

## 2020-07-22 MED ORDER — CLONAZEPAM 1 MG PO TABS: ORAL_TABLET | ORAL | 2 refills | Status: DC

## 2020-07-22 MED ORDER — LITHIUM CARBONATE 150 MG PO CAPS: 450.0000 mg | ORAL_CAPSULE | Freq: Every day | ORAL | 2 refills | Status: DC

## 2020-07-22 MED ORDER — GABAPENTIN 300 MG PO CAPS: ORAL_CAPSULE | ORAL | Status: DC

## 2020-07-22 NOTE — Progress Notes (Signed)
Amanda Rogers [id number] 01-06-1981 40 y.o.  Subjective:   Patient ID:  Amanda Rogers is a 40 y.o. (DOB 04/30/1981) female.  Chief Complaint:  Chief Complaint  Patient presents with  . Anxiety  . Depression    HPI Amanda Rogers presents to the office today for follow-up of anxiety and depression.  She reports, "I'm feeling ok." She reports that her mood and depression have been consistent and somewhat low, and reports that it could be related to health issues and being snowed in. She reports that these factors disrupted her usual routines that help with her mood and anxiety. Motivation has been "really low." Denies any mood lability. "More down than up." Denies any elevated moods or impulsive/risky behavior.   She reports some anxiety with driving in the rain and does not drive in the snow. Increased anxiety with going out in public. She reports that she continues to have panic attacks. Reports that she continues to take Klonopin 1 mg TID. She reports that her worry and anxious thoughts have been improved and been more manageable. She reports that she has been having some intrusive memories. Some re-experiencing on occasion. Sleeping ok. Denies nightmares. Appetite has been good. She reports 2-3 binge eating episodes since last visit. Concentration has been good. She reports that she has some difficulty focusing when she is in larger family gatherings. Denies SI.   Husband is switching back to nights and will work 2 days a week.   Past Psychiatric Medication Trials: Lexapro- Initially seemed to be helpful and took prior to pregnancy with daughter and re-started it when daughter was about 36 yo and took until August 2020. Zoloft- Not effective. May have felt "out of it." Prozac- Helped some with mood and then was no longer as effective.  Wellbutrin- Not effective Buspar- Felt warm and somewhat nauseated Hydroxyzine- Caused drowsiness and did not help with anxiety Nortriptyline- Has  been helpful for her mood  Gabapentin- Felt disconnected with taking 300 mg BID and 600 mg QHS. Has been helpful for anxiety.  Klonopin- Was taking 0.25 mg prn and needing to take more since move to  Klonopin ODT Xanax XR    Review of Systems:  Review of Systems  Genitourinary:       Chronic urinary retention and recurrent UTI's. Started new medication and has been referred for pelvic floor therapy  Musculoskeletal: Negative for gait problem.  Neurological: Negative for tremors.  Psychiatric/Behavioral:       Please refer to HPI    Has had recurrent UTI's and urinary retention.   Medications: I have reviewed the patient's current medications.  Current Outpatient Medications  Medication Sig Dispense Refill  . nortriptyline (PAMELOR) 50 MG capsule Take 1 capsule (50 mg total) by mouth 2 (two) times daily.    . propranolol (INDERAL) 40 MG tablet Take 40 mg by mouth 3 (three) times daily.    . SENNA PO Take by mouth as needed.    . tamsulosin (FLOMAX) 0.4 MG CAPS capsule Take by mouth.    Marland Kitchen VITAMIN D PO Take by mouth.    Amanda Rogers ON 08/09/2020] clonazePAM (KLONOPIN) 1 MG tablet Take 1/2-1 tab po TID prn anxiety 90 tablet 2  . gabapentin (NEURONTIN) 300 MG capsule Take 2-3 times daily    . lithium carbonate 150 MG capsule Take 3 capsules (450 mg total) by mouth at bedtime. 60 capsule 2  . Melatonin 10 MG TABS Take by mouth at bedtime as needed.  (Patient  not taking: Reported on 07/22/2020)     No current facility-administered medications for this visit.    Medication Side Effects: None  Allergies:  Allergies  Allergen Reactions  . Ultram [Tramadol] Rash    Past Medical History:  Diagnosis Date  . Anxiety   . Back pain   . IBS (irritable bowel syndrome)   . Social anxiety disorder     Family History  Problem Relation Age of Onset  . Anxiety disorder Sister   . Depression Sister     Social History   Socioeconomic History  . Marital status: Married    Spouse name:  Not on file  . Number of children: Not on file  . Years of education: Not on file  . Highest education level: Not on file  Occupational History  . Not on file  Tobacco Use  . Smoking status: Former Games developer  . Smokeless tobacco: Never Used  Vaping Use  . Vaping Use: Every day  Substance and Sexual Activity  . Alcohol use: Not Currently  . Drug use: No  . Sexual activity: Yes    Birth control/protection: Pill  Other Topics Concern  . Not on file  Social History Narrative  . Not on file   Social Determinants of Health   Financial Resource Strain: Not on file  Food Insecurity: Not on file  Transportation Needs: Not on file  Physical Activity: Not on file  Stress: Not on file  Social Connections: Not on file  Intimate Partner Violence: Not on file    Past Medical History, Surgical history, Social history, and Family history were reviewed and updated as appropriate.   Please see review of systems for further details on the patient's review from today.   Objective:   Physical Exam:  There were no vitals taken for this visit.  Physical Exam Constitutional:      General: She is not in acute distress. Musculoskeletal:        General: No deformity.  Neurological:     Mental Status: She is alert and oriented to person, place, and time.     Coordination: Coordination normal.  Psychiatric:        Attention and Perception: Attention and perception normal. She does not perceive auditory or visual hallucinations.        Mood and Affect: Mood is anxious. Affect is not labile, blunt, angry or inappropriate.        Speech: Speech normal.        Behavior: Behavior normal.        Thought Content: Thought content normal. Thought content is not paranoid or delusional. Thought content does not include homicidal or suicidal ideation. Thought content does not include homicidal or suicidal plan.        Cognition and Memory: Cognition and memory normal.        Judgment: Judgment normal.      Comments: Insight intact Mood presents as mildly depressed     Lab Review:     Component Value Date/Time   NA 137 06/30/2010 1151   K 4.1 06/30/2010 1151   CL 101 06/30/2010 1151   CO2 25 06/30/2010 1151   GLUCOSE 88 06/30/2010 1151   BUN 12 06/30/2010 1151   CREATININE 0.85 06/30/2010 1151   CALCIUM 9.6 06/30/2010 1151   PROT 6.8 02/26/2007 1243   ALBUMIN 4.2 02/26/2007 1243   AST 16 02/26/2007 1243   ALT 9 02/26/2007 1243   ALKPHOS 33 (L) 02/26/2007 1243   BILITOT 0.9  02/26/2007 1243   GFRNONAA >60 06/30/2010 1151   GFRAA  06/30/2010 1151    >60        The eGFR has been calculated using the MDRD equation. This calculation has not been validated in all clinical situations. eGFR's persistently <60 mL/min signify possible Chronic Kidney Disease.       Component Value Date/Time   WBC 9.1 04/05/2010 1517   RBC 4.47 04/05/2010 1517   HGB 13.9 06/30/2010 1151   HCT 39.2 06/30/2010 1151   PLT 203 04/05/2010 1517   MCV 91.8 04/05/2010 1517   MCH 31.6 04/05/2010 1517   MCHC 34.5 04/05/2010 1517   RDW 13.4 04/05/2010 1517   LYMPHSABS 2.3 04/05/2010 1517   MONOABS 0.6 04/05/2010 1517   EOSABS 0.2 04/05/2010 1517   BASOSABS 0.0 04/05/2010 1517    No results found for: POCLITH, LITHIUM   No results found for: PHENYTOIN, PHENOBARB, VALPROATE, CBMZ   .res Assessment: Plan:   Discussed increasing lithium to 450 mg at bedtime to determine if this is helpful for her mood signs and symptoms since she has not experienced any significant improvement at lower dose.  Discussed discontinuing lithium if she does not experience any improvement with increase in lithium. Continue gabapentin 300 mg 2-3 times daily. Continue Klonopin 1 mg 1/2-1 tab 3 times a day as needed. Continue nortriptyline 50 mg twice daily for mood signs and symptoms. Recommend continuing psychotherapy with Stevphen Meuse, LC [facility]. Patient to follow-up with this provider in 6 to 8 weeks or sooner if clinically  indicated. Patient advised to contact office with any questions, adverse effects, or acute worsening in signs and symptoms.  Russell was seen today for anxiety and depression.  Diagnoses and all orders for this visit:  Depression, unspecified depression type -     lithium carbonate 150 MG capsule; Take 3 capsules (450 mg total) by mouth at bedtime.  Panic disorder with agoraphobia -     gabapentin (NEURONTIN) 300 MG capsule; Take 2-3 times daily -     clonazePAM (KLONOPIN) 1 MG tablet; Take 1/2-1 tab po TID prn anxiety     Please see After Visit Summary for patient specific instructions.  Future Appointments  Date Time Provider Department Center  08/04/2020 11:00 AM Stevphen Meuse, [facility] None  08/26/2020 11:00 AM Stevphen Meuse, [facility] None  09/10/2020 11:00 AM Corie Chiquito, [facility] None    No orders of the defined types were placed in this encounter.   -------------------------------

## 2020-07-30 ENCOUNTER — Telehealth: Payer: Self-pay | Admitting: Psychiatry

## 2020-07-30 DIAGNOSIS — F32A Depression, unspecified: Secondary | ICD-10-CM

## 2020-07-30 MED ORDER — NORTRIPTYLINE HCL 50 MG PO CAPS: 50.0000 mg | ORAL_CAPSULE | Freq: Two times a day (BID) | ORAL | 0 refills | Status: DC

## 2020-07-30 NOTE — Telephone Encounter (Signed)
Pt will need a RF of Nortriptylene taking 50mg  2 day. Please send in to [facility] on [address] in [place].

## 2020-07-30 NOTE — Telephone Encounter (Signed)
Script sent  

## 2020-08-04 ENCOUNTER — Ambulatory Visit (INDEPENDENT_AMBULATORY_CARE_PROVIDER_SITE_OTHER): Payer: 59 | Admitting: Psychiatry

## 2020-08-04 ENCOUNTER — Other Ambulatory Visit: Payer: Self-pay

## 2020-08-04 DIAGNOSIS — F4001 Agoraphobia with panic disorder: Secondary | ICD-10-CM
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
## 2020-08-26 ENCOUNTER — Other Ambulatory Visit: Payer: Self-pay

## 2020-08-26 ENCOUNTER — Ambulatory Visit (INDEPENDENT_AMBULATORY_CARE_PROVIDER_SITE_OTHER): Payer: 59 | Admitting: Psychiatry

## 2020-08-26 DIAGNOSIS — F4001 Agoraphobia with panic disorder: Secondary | ICD-10-CM | POA: Diagnosis not present
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
## 2020-09-10 ENCOUNTER — Encounter: Payer: Self-pay | Admitting: Psychiatry

## 2020-09-10 ENCOUNTER — Other Ambulatory Visit: Payer: Self-pay

## 2020-09-10 ENCOUNTER — Ambulatory Visit (INDEPENDENT_AMBULATORY_CARE_PROVIDER_SITE_OTHER): Payer: 59 | Admitting: Psychiatry

## 2020-09-10 DIAGNOSIS — F4001 Agoraphobia with panic disorder: Secondary | ICD-10-CM | POA: Diagnosis not present

## 2020-09-10 DIAGNOSIS — F32A Depression, unspecified: Secondary | ICD-10-CM

## 2020-09-10 MED ORDER — CLONAZEPAM 1 MG PO TABS: ORAL_TABLET | ORAL | 2 refills | Status: DC

## 2020-09-10 MED ORDER — GABAPENTIN 300 MG PO CAPS: 300.0000 mg | ORAL_CAPSULE | Freq: Three times a day (TID) | ORAL | 1 refills | Status: DC

## 2020-09-10 MED ORDER — NORTRIPTYLINE HCL 75 MG PO CAPS: 75.0000 mg | ORAL_CAPSULE | Freq: Every day | ORAL | 0 refills | Status: DC

## 2020-09-10 MED ORDER — LITHIUM CARBONATE 150 MG PO CAPS: 450.0000 mg | ORAL_CAPSULE | Freq: Every day | ORAL | 0 refills | Status: DC

## 2020-09-10 NOTE — Progress Notes (Signed)
[name] [id number] 1980/11/11 40 y.o.  Subjective:   Patient ID:  Amanda Rogers is a 40 y.o. (DOB 1980-11-02) female.  Chief Complaint:  Chief Complaint  Patient presents with  . Anxiety  . Depression    HPI [name] presents to the office today for follow-up of anxiety and mood disturbance. She reports that she has been trying to get out of the house more. She reports that she had a few epsiodes of anxiety when going out. She went to get her nails done for her birthday and went to see [facility]. She reports that she had some anxiety with sitting on a seat in the middle of a row and was not able to enjoy it as much as expected. She went shopping by herself again. She took her daughter to a book fair. She reports that she was able to order from a food truck over the generator. She and her daughter have taken dogs to the pasrk. Has been able to enjoy some of these experiences.   She reports that her mood and motivation have improved with increase in Lithium. She reports that sadness is more situational and not as severe. She reports that she will feel exhausted after EMDR sessions.   She reports that she has noticed some increase in panic this month and that it seems to occur at times.   Sleeping ok. Has had some binge eating. Plans to meal prep today. Concentration has been good. Denies any risky or impulsive behaviors. Denies elevated mood. Denies SI.   Has been able to spend more time with her father. Daughter is doing well and playing violin. Daughter is also ice skating and is starting private lessons. Husband working nights.   She is taking Klonopin and Gabapentin TID.    Past Psychiatric Medication Trials: Lexapro- Initially seemed to be helpful and took prior to pregnancy with daughter and re-started it when daughter was about 40 yo and took until August 2020. Zoloft- Not effective. May have felt "out of it." Prozac- Helped some with mood and then was no longer  as effective.  Wellbutrin- Not effective Buspar- Felt warm and somewhat nauseated Hydroxyzine- Caused drowsiness and did not help with anxiety Nortriptyline- Has been helpful for her mood  Gabapentin- Felt disconnected with taking 300 mg BID and 600 mg QHS. Has been helpful for anxiety.  Klonopin- Was taking 0.25 mg prn and needing to take more since move to [place] Klonopin ODT Xanax XR  Review of Systems:  Review of Systems  Gastrointestinal: Negative.   Genitourinary:       Urinary retention  Musculoskeletal: Negative for gait problem.  Neurological: Negative for tremors.  Psychiatric/Behavioral:       Please refer to HPI    Medications: I have reviewed the patient's current medications.  Current Outpatient Medications  Medication Sig Dispense Refill  . Melatonin 10 MG TABS Take by mouth at bedtime as needed.    . propranolol (INDERAL) 40 MG tablet Take 40 mg by mouth 3 (three) times daily.    . SENNA PO Take by mouth as needed.    . sulfamethoxazole-trimethoprim (BACTRIM DS) 800-160 MG tablet Take by mouth.    . tamsulosin (FLOMAX) 0.4 MG CAPS capsule Take by mouth.    Marland Kitchen VITAMIN D PO Take by mouth.    Derrill Memo ON 10/30/2020] clonazePAM (KLONOPIN) 1 MG tablet Take 1/2-1 tab po TID prn anxiety 90 tablet 2  . gabapentin (NEURONTIN) 300 MG capsule  Take 1 capsule (300 mg total) by mouth 3 (three) times daily. 270 capsule 1  . lithium carbonate 150 MG capsule Take 3 capsules (450 mg total) by mouth at bedtime. 270 capsule 0  . nortriptyline (PAMELOR) 75 MG capsule Take 1 capsule (75 mg total) by mouth at bedtime. 90 capsule 0   No current facility-administered medications for this visit.    Medication Side Effects: None  Allergies:  Allergies  Allergen Reactions  . Ultram [Tramadol] Rash    Past Medical History:  Diagnosis Date  . Anxiety   . Back pain   . IBS (irritable bowel syndrome)   . Social anxiety disorder     Family History  Problem Relation Age of Onset  .  Anxiety disorder Sister   . Depression Sister     Social History   Socioeconomic History  . Marital status: Married    Spouse name: Not on file  . Number of children: Not on file  . Years of education: Not on file  . Highest education level: Not on file  Occupational History  . Not on file  Tobacco Use  . Smoking status: Former Research scientist (life sciences)  . Smokeless tobacco: Never Used  Vaping Use  . Vaping Use: Every day  Substance and Sexual Activity  . Alcohol use: Not Currently  . Drug use: No  . Sexual activity: Yes    Birth control/protection: Pill  Other Topics Concern  . Not on file  Social History Narrative  . Not on file   Social Determinants of Health   Financial Resource Strain: Not on file  Food Insecurity: Not on file  Transportation Needs: Not on file  Physical Activity: Not on file  Stress: Not on file  Social Connections: Not on file  Intimate Partner Violence: Not on file    Past Medical History, Surgical history, Social history, and Family history were reviewed and updated as appropriate.   Please see review of systems for further details on the patient's review from today.   Objective:   Physical Exam:  Wt 228 lb (103.4 kg)   BMI 37.94 kg/m   Physical Exam Constitutional:      General: She is not in acute distress. Musculoskeletal:        General: No deformity.  Neurological:     Mental Status: She is alert and oriented to person, place, and time.     Coordination: Coordination normal.  Psychiatric:        Attention and Perception: Attention and perception normal. She does not perceive auditory or visual hallucinations.        Mood and Affect: Affect is not labile, blunt, angry or inappropriate.        Speech: Speech normal.        Behavior: Behavior normal.        Thought Content: Thought content normal. Thought content is not paranoid or delusional. Thought content does not include homicidal or suicidal ideation. Thought content does not include  homicidal or suicidal plan.        Cognition and Memory: Cognition and memory normal.        Judgment: Judgment normal.     Comments: Insight intact Mood presents as less anxious and less depressed     Lab Review:     Component Value Date/Time   NA 137 06/30/2010 1151   K 4.1 06/30/2010 1151   CL 101 06/30/2010 1151   CO2 25 06/30/2010 1151   GLUCOSE 88 06/30/2010 1151  BUN 12 06/30/2010 1151   CREATININE 0.85 06/30/2010 1151   CALCIUM 9.6 06/30/2010 1151   PROT 6.8 02/26/2007 1243   ALBUMIN 4.2 02/26/2007 1243   AST 16 02/26/2007 1243   ALT 9 02/26/2007 1243   ALKPHOS 33 (L) 02/26/2007 1243   BILITOT 0.9 02/26/2007 1243   GFRNONAA >60 06/30/2010 1151   GFRAA  06/30/2010 1151    >60        The eGFR has been calculated using the MDRD equation. This calculation has not been validated in all clinical situations. eGFR's persistently <60 mL/min signify possible Chronic Kidney Disease.       Component Value Date/Time   WBC 9.1 04/05/2010 1517   RBC 4.47 04/05/2010 1517   HGB 13.9 06/30/2010 1151   HCT 39.2 06/30/2010 1151   PLT 203 04/05/2010 1517   MCV 91.8 04/05/2010 1517   MCH 31.6 04/05/2010 1517   MCHC 34.5 04/05/2010 1517   RDW 13.4 04/05/2010 1517   LYMPHSABS 2.3 04/05/2010 1517   MONOABS 0.6 04/05/2010 1517   EOSABS 0.2 04/05/2010 1517   BASOSABS 0.0 04/05/2010 1517    No results found for: POCLITH, LITHIUM   No results found for: PHENYTOIN, PHENOBARB, VALPROATE, CBMZ   .res Assessment: Plan:    Patient seen for 30 minutes.  Time spent discussing decreasing nortriptyline due to possible side effects to include urinary retention and constipation.  Will decrease nortriptyline from 50 mg twice daily to 75 mg at bedtime. Continue lithium 450 mg at bedtime since this has been helpful for her depression. Continue gabapentin 300 mg 3 times daily for anxiety. Continue Klonopin 1 mg 1/2 to 1 tablet 3 times daily as needed for anxiety. Recommend continuing  psychotherapy with Lina Sayre, [facility]. Patient to follow-up with this provider in 6 to 8 weeks or sooner if clinically indicated. Patient advised to contact office with any questions, adverse effects, or acute worsening in signs and symptoms.   Kyaira was seen today for anxiety and depression.  Diagnoses and all orders for this visit:  Depression, unspecified depression type -     nortriptyline (PAMELOR) 75 MG capsule; Take 1 capsule (75 mg total) by mouth at bedtime. -     lithium carbonate 150 MG capsule; Take 3 capsules (450 mg total) by mouth at bedtime.  Panic disorder with agoraphobia -     clonazePAM (KLONOPIN) 1 MG tablet; Take 1/2-1 tab po TID prn anxiety -     gabapentin (NEURONTIN) 300 MG capsule; Take 1 capsule (300 mg total) by mouth 3 (three) times daily.     Please see After Visit Summary for patient specific instructions.  Future Appointments  Date Time Provider [place]  09/27/2020 10:00 AM Lina Sayre, [facility] CP-CP None  10/26/2020 10:30 AM Thayer Headings, [facility] CP-CP None    No orders of the defined types were placed in this encounter.   -------------------------------

## 2020-09-27 ENCOUNTER — Ambulatory Visit: Payer: 59 | Admitting: Psychiatry

## 2020-10-26 ENCOUNTER — Encounter: Payer: Self-pay | Admitting: Psychiatry

## 2020-10-26 ENCOUNTER — Other Ambulatory Visit: Payer: Self-pay

## 2020-10-26 ENCOUNTER — Ambulatory Visit (INDEPENDENT_AMBULATORY_CARE_PROVIDER_SITE_OTHER): Payer: 59 | Admitting: Psychiatry

## 2020-10-26 DIAGNOSIS — F32A Depression, unspecified: Secondary | ICD-10-CM | POA: Diagnosis not present

## 2020-10-26 DIAGNOSIS — F4001 Agoraphobia with panic disorder: Secondary | ICD-10-CM

## 2020-10-26 MED ORDER — GABAPENTIN 300 MG PO CAPS: ORAL_CAPSULE | ORAL | 1 refills | Status: DC

## 2020-10-26 MED ORDER — LITHIUM CARBONATE 600 MG PO CAPS
600.0000 mg | ORAL_CAPSULE | Freq: Every day | ORAL | 0 refills | Status: DC
Start: 2020-10-26 — End: 2020-12-27

## 2020-10-26 MED ORDER — NORTRIPTYLINE HCL 75 MG PO CAPS: 75.0000 mg | ORAL_CAPSULE | Freq: Every day | ORAL | 0 refills | Status: DC

## 2020-10-26 NOTE — Progress Notes (Signed)
Amanda Rogers [id number] 04-08-1981 40 y.o.  Subjective:   Patient ID:  Amanda Rogers is a 40 y.o. (DOB [birth year]) female.  Chief Complaint:  Chief Complaint  Patient presents with  . Anxiety  . Depression    HPI Amanda Rogers presents to the office today for follow-up of anxiety and depression. Has started Pelvic floor therapy. She reports that she had panic attacks around her first few sessions. She reports that she also had some increase in intrusive memories and re-experiencing. Denies nightmares. Reports heightened anxiety before and after pelvic floor therapy.  She reports 4-5 "extreme" panic attacks in the last 1-1.5 months.  She reports that she has noticed some increase in physical complaints. Worry has been about the same. She reports depression has been worsening. Motivation has decreased and is not having the urge to clean and cook. Reports that she has been spending more time on the couch and in bed. Energy has been ok. She describes blah feeling. She reports sad mood. She reports irritability towards herself. Sleep has been disrupted over the last week with husband talking about changing jobs. Appetite has been decreased. Concentration has been poor. Notices she has made mistakes with cooking and baking and lost track of what she had added. She reports that she has not been doing very many of her hobbies or interests. Denies SI.    Past Psychiatric Medication Trials: Lexapro- Initially seemed to be helpful and took prior to pregnancy with daughter and re-started it when daughter was about 49 yo and took until August 2020. Zoloft- Not effective. May have felt "out of it." Prozac- Helped some with mood and then was no longer as effective.  Wellbutrin- Not effective Lithium Buspar- Felt warm and somewhat nauseated Hydroxyzine- Caused drowsiness and did not help with anxiety Nortriptyline- Has been helpful for her mood  Gabapentin- Felt disconnected with taking 300 mg BID  and 600 mg QHS. Has been helpful for anxiety.  Klonopin- Was taking 0.25 mg prn and needing to take more since move to [place] Klonopin ODT Xanax XR    Review of Systems:  Review of Systems  Musculoskeletal: Negative for gait problem.  Neurological: Negative for tremors and headaches.  Psychiatric/Behavioral:       Please refer to HPI    Medications: I have reviewed the patient's current medications.  Current Outpatient Medications  Medication Sig Dispense Refill  . [START ON 10/30/2020] clonazePAM (KLONOPIN) 1 MG tablet Take 1/2-1 tab po TID prn anxiety 90 tablet 2  . Melatonin 10 MG TABS Take by mouth at bedtime as needed.    . propranolol (INDERAL) 40 MG tablet Take 40 mg by mouth 3 (three) times daily.    . SENNA PO Take by mouth as needed.    . tamsulosin (FLOMAX) 0.4 MG CAPS capsule Take by mouth.    Marland Kitchen VITAMIN D PO Take by mouth.    . gabapentin (NEURONTIN) 300 MG capsule Decrease to twice daily for one week, then decrease to 1 capsule at bedtime for one week, then stop 270 capsule 1  . lithium carbonate 600 MG capsule Take 1 capsule (600 mg total) by mouth at bedtime. 90 capsule 0  . nortriptyline (PAMELOR) 75 MG capsule Take 1 capsule (75 mg total) by mouth at bedtime. 90 capsule 0   No current facility-administered medications for this visit.    Medication Side Effects: Other: Possible urinary retention and constipation  Allergies:  Allergies  Allergen Reactions  . Ultram [Tramadol] Rash  Past Medical History:  Diagnosis Date  . Anxiety   . Back pain   . IBS (irritable bowel syndrome)   . Social anxiety disorder     Past Medical History, Surgical history, Social history, and Family history were reviewed and updated as appropriate.   Please see review of systems for further details on the patient's review from today.   Objective:   Physical Exam:  There were no vitals taken for this visit.  Physical Exam Constitutional:      General: She is not in acute  distress. Musculoskeletal:        General: No deformity.  Neurological:     Mental Status: She is alert and oriented to person, place, and time.     Coordination: Coordination normal.  Psychiatric:        Attention and Perception: Attention and perception normal. She does not perceive auditory or visual hallucinations.        Mood and Affect: Mood is anxious and depressed. Affect is not labile, blunt, angry or inappropriate.        Speech: Speech normal.        Behavior: Behavior normal.        Thought Content: Thought content normal. Thought content is not paranoid or delusional. Thought content does not include homicidal or suicidal ideation. Thought content does not include homicidal or suicidal plan.        Cognition and Memory: Cognition and memory normal.        Judgment: Judgment normal.     Comments: Insight intact     Lab Review:     Component Value Date/Time   NA 137 06/30/2010 1151   K 4.1 06/30/2010 1151   CL 101 06/30/2010 1151   CO2 25 06/30/2010 1151   GLUCOSE 88 06/30/2010 1151   BUN 12 06/30/2010 1151   CREATININE 0.85 06/30/2010 1151   CALCIUM 9.6 06/30/2010 1151   PROT 6.8 02/26/2007 1243   ALBUMIN 4.2 02/26/2007 1243   AST 16 02/26/2007 1243   ALT 9 02/26/2007 1243   ALKPHOS 33 (L) 02/26/2007 1243   BILITOT 0.9 02/26/2007 1243   GFRNONAA >60 06/30/2010 1151   GFRAA  06/30/2010 1151    >60        The eGFR has been calculated using the MDRD equation. This calculation has not been validated in all clinical situations. eGFR's persistently <60 mL/min signify possible Chronic Kidney Disease.       Component Value Date/Time   WBC 9.1 04/05/2010 1517   RBC 4.47 04/05/2010 1517   HGB 13.9 06/30/2010 1151   HCT 39.2 06/30/2010 1151   PLT 203 04/05/2010 1517   MCV 91.8 04/05/2010 1517   MCH 31.6 04/05/2010 1517   MCHC 34.5 04/05/2010 1517   RDW 13.4 04/05/2010 1517   LYMPHSABS 2.3 04/05/2010 1517   MONOABS 0.6 04/05/2010 1517   EOSABS 0.2  04/05/2010 1517   BASOSABS 0.0 04/05/2010 1517    No results found for: POCLITH, LITHIUM   No results found for: PHENYTOIN, PHENOBARB, VALPROATE, CBMZ   .res Assessment: Plan:   Pt seen for 30 minutes and time spent counseling pt regarding treatment options. Pt reports that increased anxiety and depression may be related to recent trigger. She reports that it is unclear if she has had worsening s/s with decrease in Nortriptyline dose. She prefers not to increase dose at this time. Continue Nortriptyline 75 mg po QHS for depression.  Will increase Lithium to 600 mg at bedtime to  improve mood s/s. Discussed decreasing and then discontinuing gabapentin since benefit is unclear and gabapentin may be causing drowsiness and fatigue.  Continue Klonopin 1 mg po TID for anxiety.  Recommend continuing therapy with Lina Sayre, [facility].  Pt to follow-up in 2 months or sooner if clinically indicated.  Patient advised to contact office with any questions, adverse effects, or acute worsening in signs and symptoms.    Amanda Rogers was seen today for anxiety and depression.  Diagnoses and all orders for this visit:  Panic disorder with agoraphobia -     gabapentin (NEURONTIN) 300 MG capsule; Decrease to twice daily for one week, then decrease to 1 capsule at bedtime for one week, then stop  Depression, unspecified depression type -     lithium carbonate 600 MG capsule; Take 1 capsule (600 mg total) by mouth at bedtime. -     nortriptyline (PAMELOR) 75 MG capsule; Take 1 capsule (75 mg total) by mouth at bedtime.     Please see After Visit Summary for patient specific instructions.  Future Appointments  Date Time Provider [place]  11/18/2020  2:00 PM Lina Sayre, [facility] CP-CP None  12/24/2020 10:00 AM Lina Sayre, [facility] CP-CP None  12/27/2020  1:15 PM Thayer Headings, [facility]NP CP-CP None    No orders of the defined types were placed in this encounter.   -------------------------------

## 2020-10-27 ENCOUNTER — Ambulatory Visit (INDEPENDENT_AMBULATORY_CARE_PROVIDER_SITE_OTHER): Payer: 59 | Admitting: Psychiatry

## 2020-10-27 DIAGNOSIS — F4001 Agoraphobia with panic disorder: Secondary | ICD-10-CM | POA: Diagnosis not present

## 2020-10-27 NOTE — Progress Notes (Signed)
      [facility] Counselor/Therapist Progress Note  Patient ID: Amanda Rogers, MRN: [id number],    Date: 10/27/2020  Time Spent: 50 minutes start time 10:06 AM end time 10:56 AM  Treatment Type: Individual Therapy  Reported Symptoms: panic, anxiety, triggered responses, sadness, tearful  Mental Status Exam:  Appearance:   Casual and Neat     Behavior:  Appropriate  Motor:  Normal  Speech/Language:   Normal Rate  Affect:  Appropriate and Tearful  Mood:  anxious and sad  Thought process:  normal  Thought content:    WNL  Sensory/Perceptual disturbances:    WNL  Orientation:  oriented to person, place, time/date and situation  Attention:  Good  Concentration:  Good  Memory:  WNL  Fund of knowledge:   Good  Insight:    Good  Judgment:   Good  Impulse Control:  Good   Risk Assessment: Danger to Self:  No Self-injurious Behavior: No Danger to Others: No Duty to Warn:no Physical Aggression / Violence:No  Access to Firearms a concern: No  Gang Involvement:No   Subjective: Patient was present for session.  She shared she was not doing well because her father had a heart attack and is in ICU currently. She shared she is very upset because her dad is supposed to get a heart cath but he is not wanting to do that. She shared her husband is on the call list and is getting information about it and that helps. She stated she feels that her dad knows what is going on but he doesn't want to go because he is ready to die.  Patient was allowed time to discuss her feelings and concerns.  She was encouraged to take things 1 step at a time and focus on what she can do to take care of herself and her daughter at this time.  She was encouraged to remember she is done a great job of making sure she has special time with her dad over the past several months as well as making sure her daughter has several good experiences with her father.  Patient did admit probably the best person to talk to her  father would be her daughter because he would do what he needed to do for her.  Patient was encouraged to affirm herself and to make sure she focus on the things that she can control fix and change.  Interventions: Cognitive Behavioral Therapy and Solution-Oriented/Positive Psychology  Diagnosis:   ICD-10-CM   1. Panic disorder with agoraphobia  F40.01     Plan: Patient is to use CBT and coping skills to decrease anxiety symptoms.  Patient is to focus on reminding herself of all that she is done for her father during this very difficult time with him to help decrease some of her anxiety.  Patient is to focus on the things that she can control fix and change.  Patient is to take medication as directed. Long-term goal: Resolve the core conflict that is the source of anxiety-reduce panic by 50% Short-term goal: Identify the major life complex in the past and present that form the basis for present anxiety   Stevphen Meuse, [facility]

## 2020-11-09 ENCOUNTER — Telehealth: Payer: Self-pay | Admitting: Psychiatry

## 2020-11-09 NOTE — Telephone Encounter (Signed)
Cesia wanted you to know that her father passed away on Nov 26, 2022.

## 2020-11-10 NOTE — Telephone Encounter (Signed)
Hey. I called her to offer her one of those spots. That is when she told me. She couldn't do the appt.

## 2020-11-18 ENCOUNTER — Ambulatory Visit (INDEPENDENT_AMBULATORY_CARE_PROVIDER_SITE_OTHER): Payer: 59 | Admitting: Psychiatry

## 2020-11-18 ENCOUNTER — Other Ambulatory Visit: Payer: Self-pay

## 2020-11-18 DIAGNOSIS — F4001 Agoraphobia with panic disorder: Secondary | ICD-10-CM | POA: Diagnosis not present

## 2020-11-18 NOTE — Progress Notes (Signed)
      [facility] Counselor/Therapist Progress Note  Patient ID: Amanda Rogers, MRN: [id number],    Date: 11/18/2020  Time Spent: 58 minutes start time 1:53 PM end time 2:51 PM  Treatment Type: Individual Therapy  Reported Symptoms: sadness, grief issues, anxiety  Mental Status Exam:  Appearance:   Well Groomed     Behavior:  Appropriate  Motor:  Normal  Speech/Language:   Normal Rate  Affect:  Appropriate and Tearful  Mood:  sad  Thought process:  normal  Thought content:    WNL  Sensory/Perceptual disturbances:    WNL  Orientation:  oriented to person, place, time/date and situation  Attention:  Good  Concentration:  Good  Memory:  WNL  Fund of knowledge:   Good  Insight:    Good  Judgment:   Good  Impulse Control:  Good   Risk Assessment: Danger to Self:  No Self-injurious Behavior: No Danger to Others: No Duty to Warn:no Physical Aggression / Violence:No  Access to Firearms a concern: No  Gang Involvement:No   Subjective: Patient was present for session.  Patient reported that her father had passed away on Jun [birth year], 2023which is his birthday.  She shared what occurred and how difficult it was for her.  Patient stated she has not been doing well and she has had lots of triggers and panic since that time.  Did EMDR set on hearing her dad cry, suds level 10, negative cognition "I did not do enough" felt sadness and guilt in her chest.  Patient was able to reduce suds level to 4.  She was able to recognize that she had good time with him right before he passed away and that was positive.  She was also able to realize that he and her daughter were very close and that is because she had moved time a year ago and allow them to have time in the relationship.  Patient also was able to recognize that her dad was at peace with dying because he did not like how he was physically and mentally.  Patient stated it still very hard for her but felt that she got to a better place in session.   Patient did not want to go any further at today's session but will continue working on it at next session.  Patient was encouraged to journal over the next week what seems to surface.  Interventions: Solution-Oriented/Positive Psychology and Eye Movement Desensitization and Reprocessing (EMDR)  Diagnosis:   ICD-10-CM   1. Panic disorder with agoraphobia  F40.01     Plan: Patient is to use CBT and coping skills to decrease anxiety symptoms.  Patient is to journal what surfaces between sessions.  Patient is to take medication as directed. Long-term goal: Resolve the core conflict that is the source of anxiety-reduce panic by 50% Short-term goal: Identify the major life complex in the past and present that form the basis for present anxiety   Stevphen Meuse, [facility]

## 2020-11-25 ENCOUNTER — Ambulatory Visit: Payer: 59 | Admitting: Psychiatry

## 2020-11-25 ENCOUNTER — Other Ambulatory Visit: Payer: Self-pay

## 2020-11-25 DIAGNOSIS — F4001 Agoraphobia with panic disorder: Secondary | ICD-10-CM

## 2020-11-25 NOTE — Progress Notes (Signed)
      [facility] Counselor/Therapist Progress Note  Patient ID: Amanda Rogers, MRN: [id number],    Date: 11/25/2020  Time Spent: 50 minutes start time 4:07 PM end time 4:57 PM  Treatment Type: Individual Therapy  Reported Symptoms: anxiety, panic, eating issues, sadness, sleep issues, triggered responses, memory issues, difficulty functioning  Mental Status Exam:  Appearance:   Casual and Neat     Behavior:  Appropriate  Motor:  Normal  Speech/Language:   Normal Rate  Affect:  Appropriate  Mood:  anxious  Thought process:  normal  Thought content:    WNL  Sensory/Perceptual disturbances:    WNL  Orientation:  oriented to person, place, time/date, and situation  Attention:  Good  Concentration:  Good  Memory:  Difficulty with short term memory  Fund of knowledge:   Good  Insight:    Good  Judgment:   Good  Impulse Control:  Good   Risk Assessment: Danger to Self:  No Self-injurious Behavior: No Danger to Others: No Duty to Warn:no Physical Aggression / Violence:No  Access to Firearms a concern: No  Gang Involvement:No   Subjective: Patient was present for session.  She shared her anxiety and panic has increased since last session.  She shared she is behind on everything not sleeping and just not functioning like she typically does.  She explained that she is forgetting to do things.  Patient reported she felt that her father's death is the thing that still creating all of her emotional issues.  Patient did EMDR set on the phone call that he was in the hospital, suds level 10, negative cognition "there is something wrong with me" felt scared in her chest and back.  Patient was able to resolve the set and reduce suds level to 2.  She was able to recognize that she has always been the youngest sister and they have always taken care of her but that I did not mean that there was anything wrong with anybody and she did everything that she could do.  Patient shared she is going to  the beach and that was going to be very triggering because that was her dad's favorite spot.  She will also be returning on Father's Day so she knows that will bring up stuff with her dad.  Encouraged her to figure out ways to honor him on that day and to help her daughter and her nieces who will be going with her on her for him as well.  Patient was able to develop some plans she felt positive about.  Interventions: Solution-Oriented/Positive Psychology and Eye Movement Desensitization and Reprocessing (EMDR)  Diagnosis:   ICD-10-CM   1. Panic disorder with agoraphobia  F40.01       Plan: Patient is to use CBT and coping skills to decrease anxiety symptoms.  Patient is to follow plans from session to honor her father while she is at the beach and help her daughter to do the same.  Patient is to practice relaxation exercises and grounding exercises as needed.  Patient is to take medication as directed. Long-term goal: Resolve the core conflict that is the source of anxiety-reduce panic by 50% Short-term goal: Identify the major life complex in the past and present that form the basis for present anxiety  Stevphen Meuse, [facility]

## 2020-12-09 ENCOUNTER — Telehealth: Payer: Self-pay | Admitting: Psychiatry

## 2020-12-09 DIAGNOSIS — F4010 Social phobia, unspecified: Secondary | ICD-10-CM

## 2020-12-09 MED ORDER — PROPRANOLOL HCL 40 MG PO TABS: 40.0000 mg | ORAL_TABLET | Freq: Three times a day (TID) | ORAL | 2 refills | Status: DC

## 2020-12-09 NOTE — Telephone Encounter (Signed)
Please let her know it has been sent.  

## 2020-12-09 NOTE — Telephone Encounter (Signed)
Do you normally prescribe this?It's not mentioned in the last note and the med list shows entered on 03/2020

## 2020-12-09 NOTE — Telephone Encounter (Signed)
Next visit is 12/27/20. Requesting refill on Propanolol called to [facility], [address], [place], [place]. Phone number is [phone].

## 2020-12-24 ENCOUNTER — Other Ambulatory Visit: Payer: Self-pay

## 2020-12-24 ENCOUNTER — Ambulatory Visit: Payer: 59 | Admitting: Psychiatry

## 2020-12-24 DIAGNOSIS — F4001 Agoraphobia with panic disorder: Secondary | ICD-10-CM

## 2020-12-24 NOTE — Progress Notes (Signed)
      [facility] Counselor/Therapist Progress Note  Patient ID: Amanda Rogers, MRN: [id number],    Date: 12/24/2020  Time Spent: 48 minutes start time 10:14 AM end time 11:02 AM  Treatment Type: Individual Therapy  Reported Symptoms: anxiety, crying spells, grief issues, loss of appetite, flashbacks  Mental Status Exam:  Appearance:   Casual and Neat     Behavior:  Appropriate  Motor:  Normal  Speech/Language:   Normal Rate  Affect:  Appropriate  Mood:  anxious  Thought process:  normal  Thought content:    WNL  Sensory/Perceptual disturbances:    WNL  Orientation:  oriented to person, place, time/date, and situation  Attention:  Good  Concentration:  Good  Memory:  WNL  Fund of knowledge:   Good  Insight:    Good  Judgment:   Good  Impulse Control:  Good   Risk Assessment: Danger to Self:  No Self-injurious Behavior: No Danger to Others: No Duty to Warn:no Physical Aggression / Violence:No  Access to Firearms a concern: No  Gang Involvement:No   Subjective: Patient was present for session.  She shared that they went on their beach and it was adventurous.  She went on to share that her mom did end up going with them and it was good memories. Patient shared that he cries every day still.  She is also not eating much, she shared that her GI issues have returned and she is wondering if it is due to not eating for so long.  Patient was able to say that she is having some issues due to messages, suds level 10, negative cognition "I am dirty" felt embarrassed and sadness in her stomach.  Patient was able to reduce suds level to 4.  She was able to recognize that her unresolved traumas from the past are surfacing and it has gotten worse with her dad's death.  Patient was allowed time to process some of her emotions and discussed the importance of releasing these emotions appropriately.  Also scheduled an earlier session for patient to continue working on the  issue.  Interventions: Cognitive Behavioral Therapy, Solution-Oriented/Positive Psychology, Eye Movement Desensitization and Reprocessing (EMDR), and Insight-Oriented  Diagnosis:   ICD-10-CM   1. Panic disorder with agoraphobia  F40.01       Plan: Patient is to use CBT and coping skills to decrease anxiety.  Patient is to continue reminding herself that she is enough.  Patient is to release negative emotions appropriately through exercise.  Patient is to take medication as directed. Long-term goal: Resolve the core conflict that is the source of anxiety-reduce panic by 50% Short-term goal: Identify the major life complex in the past and present that form the basis for present anxiety  Amanda Rogers, [facility]

## 2020-12-27 ENCOUNTER — Ambulatory Visit: Payer: 59 | Admitting: Psychiatry

## 2020-12-27 ENCOUNTER — Encounter: Payer: Self-pay | Admitting: Psychiatry

## 2020-12-27 ENCOUNTER — Other Ambulatory Visit: Payer: Self-pay

## 2020-12-27 DIAGNOSIS — F4010 Social phobia, unspecified: Secondary | ICD-10-CM

## 2020-12-27 DIAGNOSIS — Z79899 Other long term (current) drug therapy: Secondary | ICD-10-CM | POA: Diagnosis not present

## 2020-12-27 DIAGNOSIS — F4001 Agoraphobia with panic disorder: Secondary | ICD-10-CM

## 2020-12-27 DIAGNOSIS — F32A Depression, unspecified: Secondary | ICD-10-CM | POA: Diagnosis not present

## 2020-12-27 MED ORDER — CLONAZEPAM 1 MG PO TABS: ORAL_TABLET | ORAL | 2 refills | Status: DC

## 2020-12-27 MED ORDER — PROPRANOLOL HCL 40 MG PO TABS: 40.0000 mg | ORAL_TABLET | Freq: Three times a day (TID) | ORAL | 2 refills | Status: DC

## 2020-12-27 MED ORDER — LITHIUM CARBONATE 300 MG PO CAPS
900.0000 mg | ORAL_CAPSULE | Freq: Every day | ORAL | 1 refills | Status: DC
Start: 2020-12-27 — End: 2021-01-13

## 2020-12-27 MED ORDER — NORTRIPTYLINE HCL 50 MG PO CAPS: 50.0000 mg | ORAL_CAPSULE | Freq: Every day | ORAL | 1 refills | Status: DC

## 2020-12-27 NOTE — Progress Notes (Signed)
Amanda Rogers [id number] 06-16-1981 40 y.o.  Subjective:   Patient ID:  Amanda Rogers is a 40 y.o. (DOB April 18, 1981) female.  Chief Complaint:  Chief Complaint  Patient presents with   Anxiety   Depression   Other    Impulsivity     HPI Amanda Rogers presents to the office today for follow-up of anxiety and mood disturbance. She denies side effects with increase in Lithium.   Father died 28-Nov-2020. Events surrounding father's death are similar to grandfather's death. She visited her father and was told he would be able to be discharged to a rehab facility. She reports that when she and her mother were leaving that he was screaming for them and he died that night. Went to funeral home on father's birthday. She has been having difficulty with the loss of her father. She has been trying to work through past memories and focus on positive memories.   She reports that the first 2 weeks after father's death she was not able to eat or sleep. She reports that she has not been eating much and husband has been concerned about her not eating enough. She is trying to eat at least once a day. She reports that she has been having worsening depression and anxiety. She reports that panic has been severe.   She reports that she has not been doing as much around the house and has not been wanting to leave home as much.   She reports that she has been engaging in some risky behaviors and is unsure why she is doing this. She reports that these behaviors are impulsive. She reports that she has also had a few times where she has been "sneaking" wine and drinking daily. She reports that she was drinking "to have fun." She reports that she has noticed an increase in interest in doing things. She reports that she has been having trouble sitting still. She reports that she was doing more things than usual, such as drawing, crocheting, etc. Reports that she was sleeping less. Energy and motivation were high.  Reports h/o impulsivity, risky behavior, and decreased need for sleep in the past.   Continues to sleep less. She reports that mood is "a little bit high."Some mild irritability.  Anxiety has been elevated. Energy and motivation have decreased some compared to June. She reports that she has periods of racing thoughts and other times "it's just blank." She reports that she has been trying to start a book and had difficulty. Denies SI.   She reports that she has continued Gabapentin since she did not want to stop taking it at the time of her father's death.   She reports that they went to the beach the week of June 12th. She reports that they took 2 nieces along with their daughter. Sister-in-law brought mother down for a couple of days. Daughter went to the beach with her sister and family last week.   Past Psychiatric Medication Trials: Lexapro- Initially seemed to be helpful and took prior to pregnancy with daughter and re-started it when daughter was about 32 yo and took until August 2020. Zoloft- Not effective. May have felt "out of it." Prozac- Helped some with mood and then was no longer as effective. Wellbutrin- Not effective Lithium Buspar- Felt warm and somewhat nauseated Hydroxyzine- Caused drowsiness and did not help with anxiety Nortriptyline- Has been helpful for her mood Gabapentin- Felt disconnected with taking 300 mg BID and 600 mg QHS. Has been helpful  for anxiety. Klonopin- Was taking 0.25 mg prn and needing to take more since move to [place] Klonopin ODT Xanax XR    Review of Systems:  Review of Systems  Gastrointestinal:  Positive for diarrhea.       Chronic IBS  Musculoskeletal:  Negative for gait problem.  Neurological:  Negative for tremors.  Psychiatric/Behavioral:         Please refer to HPI   Has not seen pelvic floor therapist recently.   Medications: I have reviewed the patient's current medications.  Current Outpatient Medications  Medication Sig Dispense  Refill   gabapentin (NEURONTIN) 300 MG capsule Decrease to twice daily for one week, then decrease to 1 capsule at bedtime for one week, then stop 270 capsule 1   Melatonin 10 MG TABS Take by mouth at bedtime as needed.     SENNA PO Take by mouth as needed.     tamsulosin (FLOMAX) 0.4 MG CAPS capsule Take by mouth.     VITAMIN D PO Take by mouth.     [START ON 01/24/2021] clonazePAM (KLONOPIN) 1 MG tablet Take 1/2-1 tab po TID prn anxiety 90 tablet 2   lithium 300 MG capsule Take 3 capsules (900 mg total) by mouth at bedtime. 90 capsule 1   nortriptyline (PAMELOR) 50 MG capsule Take 1 capsule (50 mg total) by mouth at bedtime. 30 capsule 1   propranolol (INDERAL) 40 MG tablet Take 1 tablet (40 mg total) by mouth 3 (three) times daily. 90 tablet 2   No current facility-administered medications for this visit.    Medication Side Effects: None  Allergies:  Allergies  Allergen Reactions   Ultram [Tramadol] Rash    Past Medical History:  Diagnosis Date   Anxiety    Back pain    IBS (irritable bowel syndrome)    Social anxiety disorder     Past Medical History, Surgical history, Social history, and Family history were reviewed and updated as appropriate.   Please see review of systems for further details on the patient's review from today.   Objective:   Physical Exam:  There were no vitals taken for this visit.  Physical Exam Constitutional:      General: She is not in acute distress. Musculoskeletal:        General: No deformity.  Neurological:     Mental Status: She is alert and oriented to person, place, and time.     Coordination: Coordination normal.  Psychiatric:        Attention and Perception: Attention and perception normal. She does not perceive auditory or visual hallucinations.        Mood and Affect: Mood is anxious. Affect is not labile, blunt, angry or inappropriate.        Speech: Speech normal.        Behavior: Behavior normal.        Thought Content:  Thought content normal. Thought content is not paranoid or delusional. Thought content does not include homicidal or suicidal ideation. Thought content does not include homicidal or suicidal plan.        Cognition and Memory: Cognition and memory normal.        Judgment: Judgment normal.     Comments: Insight intact Dysphoric mood    Lab Review:     Component Value Date/Time   NA 137 06/30/2010 1151   K 4.1 06/30/2010 1151   CL 101 06/30/2010 1151   CO2 25 06/30/2010 1151   GLUCOSE 88 06/30/2010 1151  BUN 12 06/30/2010 1151   CREATININE 0.85 06/30/2010 1151   CALCIUM 9.6 06/30/2010 1151   PROT 6.8 02/26/2007 1243   ALBUMIN 4.2 02/26/2007 1243   AST 16 02/26/2007 1243   ALT 9 02/26/2007 1243   ALKPHOS 33 (L) 02/26/2007 1243   BILITOT 0.9 02/26/2007 1243   GFRNONAA >60 06/30/2010 1151   GFRAA  06/30/2010 1151    >60        The eGFR has been calculated using the MDRD equation. This calculation has not been validated in all clinical situations. eGFR's persistently <60 mL/min signify possible Chronic Kidney Disease.       Component Value Date/Time   WBC 9.1 04/05/2010 1517   RBC 4.47 04/05/2010 1517   HGB 13.9 06/30/2010 1151   HCT 39.2 06/30/2010 1151   PLT 203 04/05/2010 1517   MCV 91.8 04/05/2010 1517   MCH 31.6 04/05/2010 1517   MCHC 34.5 04/05/2010 1517   RDW 13.4 04/05/2010 1517   LYMPHSABS 2.3 04/05/2010 1517   MONOABS 0.6 04/05/2010 1517   EOSABS 0.2 04/05/2010 1517   BASOSABS 0.0 04/05/2010 1517    No results found for: POCLITH, LITHIUM   No results found for: PHENYTOIN, PHENOBARB, VALPROATE, CBMZ   .res Assessment: Plan:   Pt seen for 30 minutes and time spent discussing recent mood s/s and that she is describing some s/s associated with possible hypomania. Discussed that if she has an underlying mood d/o that this may help with treatment since she has had minimal improvement with antidepressants. Discussed that tricyclic antidepressants can  precipitate mania. She reports that she would like to continue to reduce Nortriptyline since she thinks this is no longer helpful for mood and anxiety. Will decrease Nortriptyline to 50 mg at bedtime.  Discussed considering increase in Lithium to 900 mg po QHS for mood stabilization since this may decrease impulsivity, improve depression, and stabilize mood. Pt agrees to increase in Lithium to 900 mg po QHS. Discussed obtaining Lithium level after she has been at the 900 mg dose. Will also order BMP and TSH to establish baseline. Discussed that she could continue to decrease and discontinue Gabapentin to 300 mg po QHS for one week, then stop.  Continue Propranolol 40 mg po TID for anxiety.  Continue Klonopin 1 mg TID prn anxiety.  Recommend continuing therapy with Lina Sayre, [facility]. Pt to follow-up with this provider in 2 weeks or sooner if clinically indicated. Patient advised to contact office with any questions, adverse effects, or acute worsening in signs and symptoms.  Amanda Rogers was seen today for anxiety, depression and other.  Diagnoses and all orders for this visit:  High risk medication use -     Lithium level -     Basic metabolic panel -     TSH  Depression, unspecified depression type -     lithium 300 MG capsule; Take 3 capsules (900 mg total) by mouth at bedtime. -     nortriptyline (PAMELOR) 50 MG capsule; Take 1 capsule (50 mg total) by mouth at bedtime.  Panic disorder with agoraphobia -     clonazePAM (KLONOPIN) 1 MG tablet; Take 1/2-1 tab po TID prn anxiety  Social anxiety disorder -     propranolol (INDERAL) 40 MG tablet; Take 1 tablet (40 mg total) by mouth 3 (three) times daily.    Please see After Visit Summary for patient specific instructions.  Future Appointments  Date Time Provider [place]  01/06/2021 12:00 PM Lina Sayre, [facility] CP-CP  None  01/13/2021 12:30 PM Thayer Headings, [facility] CP-CP None  01/13/2021  1:00 PM Lina Sayre, [facility] CP-CP None     Orders Placed This Encounter  Procedures   Lithium level   Basic metabolic panel   TSH     -------------------------------

## 2021-01-06 ENCOUNTER — Ambulatory Visit: Payer: 59 | Admitting: Psychiatry

## 2021-01-13 ENCOUNTER — Ambulatory Visit: Payer: 59 | Admitting: Psychiatry

## 2021-01-13 ENCOUNTER — Encounter: Payer: Self-pay | Admitting: Psychiatry

## 2021-01-13 ENCOUNTER — Other Ambulatory Visit: Payer: Self-pay

## 2021-01-13 DIAGNOSIS — F39 Unspecified mood [affective] disorder: Secondary | ICD-10-CM | POA: Diagnosis not present

## 2021-01-13 DIAGNOSIS — F32A Depression, unspecified: Secondary | ICD-10-CM

## 2021-01-13 DIAGNOSIS — F4001 Agoraphobia with panic disorder: Secondary | ICD-10-CM

## 2021-01-13 LAB — TSH: TSH: 4.82 mIU/L — ABNORMAL HIGH

## 2021-01-13 LAB — LITHIUM LEVEL: Lithium Lvl: 1.2 mmol/L (ref 0.6–1.2)

## 2021-01-13 LAB — BASIC METABOLIC PANEL
BUN/Creatinine Ratio: 10 (calc) (ref 6–22)
BUN: 13 mg/dL (ref 7–25)
CO2: 27 mmol/L (ref 20–32)
Calcium: 9.2 mg/dL (ref 8.6–10.2)
Chloride: 102 mmol/L (ref 98–110)
Creat: 1.25 mg/dL — ABNORMAL HIGH (ref 0.50–0.97)
Glucose, Bld: 95 mg/dL (ref 65–139)
Potassium: 4.2 mmol/L (ref 3.5–5.3)
Sodium: 136 mmol/L (ref 135–146)

## 2021-01-13 NOTE — Progress Notes (Signed)
      [facility] Counselor/Therapist Progress Note  Patient ID: Amanda Rogers, MRN: [id number],    Date: 01/13/2021  Time Spent: 50 minutes start time 1:12 PM end time 2:02 PM  Treatment Type: Individual Therapy  Reported Symptoms: panic, sadness, anxiety, rumination, intrusive thoughts, sleep issues, nightmares  Mental Status Exam:  Appearance:   Casual and Neat     Behavior:  Appropriate  Motor:  Normal  Speech/Language:   Normal Rate  Affect:  Appropriate  Mood:  anxious  Thought process:  normal  Thought content:    WNL  Sensory/Perceptual disturbances:    WNL  Orientation:  oriented to person, place, time/date, and situation  Attention:  Good  Concentration:  Good  Memory:  WNL  Fund of knowledge:   Good  Insight:    Good  Judgment:   Good  Impulse Control:  Good   Risk Assessment: Danger to Self:  No Self-injurious Behavior: No Danger to Others: No Duty to Warn:no Physical Aggression / Violence:No  Access to Firearms a concern: No  Gang Involvement:No   Subjective: Patient was present for session.  She shared that the intrusive thoughts have decreased.  She shared there her husband is working on some of his issues which is positive. Her husband is going to change jobs and they feel it will be a better situaiton.  Patient stated that she felt she needed to address issues with her dad again.  Patient shared she has been having sleep issues and nightmares surrounding hearing him call her at the hospital.  Did EMDR set on that picture, suds level 10, negative cognition "I made a mistake" felt anger in her shoulders and chest.  Patient was able to resolve the set and reduce suds level to 1.  She was able to recognize that she had done everything she could for her dad in that moment.  She was also able to connect with a 40-year-old part of her that had witnessed her grandfather dying in the hospital and recognized that she had been triggered by her dad dying in the hospital  as well.  Patient was encouraged to continue exercising journaling and affirming herself regularly.  Interventions: Solution-Oriented/Positive Psychology, Eye Movement Desensitization and Reprocessing (EMDR), and Insight-Oriented  Diagnosis:   ICD-10-CM   1. Panic disorder with agoraphobia  F40.01       Plan: Patient is to use CBT and coping skills to decrease anxiety symptoms.  Patient is to affirm herself regularly that she is enough and she made a right decision.  Patient is to journal to release negative emotions appropriately.  Patient is to take medication as directed Long-term goal: Resolve the core conflict that is the source of anxiety-reduce panic by 50% Short-term goal: Identify the major life complex in the past and present that form the basis for present anxiety    Stevphen Meuse, [facility]

## 2021-01-13 NOTE — Progress Notes (Signed)
Amanda Rogers [id number] 04/27/1981 40 y.o.  Subjective:   Patient ID:  Amanda Rogers is a 40 y.o. (DOB [birth year]) female.  Chief Complaint:  Chief Complaint  Patient presents with   Anxiety   Other    Mood disturbance    HPI Amanda Rogers presents to the office today for follow-up of anxiety and mood disturbance. She reports "depression-wise I feel good." She reports that anxiety has been "the same." She reports periods of dread "like something bad is going to happen" and this is accompanied by nausea. She reports impulsivity has improved. Denies impulsive or risky behavior other than wanting to spend more money. Denies elevated mood. Energy and motivation have been improved. Has started getting up at 7:30 am to prepare for school starting. She reports that she has been wanting to get out and do more and this is limited due to anxiety. She reports that she relies on her daughter to help with social anxiety and will have her accompany her when running errands since her husband has been sick. She reports that she has had a couple of panic attacks. She reports that worry and anxious thoughts are still present, particularly when she is riding/driving in a car and about daughter going to 7th grade. Sleeping ok with occasional nights of middle of the night awakenings and will hear her deceased father's voice (reliving him screaming out for her and her mother during their last visit with him before he died). Does not recall nightmares. She had anxiety with getting labs drawn since location reminded her of hospital and her father's death. Appetite has not been good due to anxiety and n/v, especially in the mornings. Rarely feels hungry. Concentration has been ok- "it could be better." She tried several times to read a book and downloaded another book. She reports that she has been having to go back to re-read less. Denies SI.   Asks about switching Klonopin to another medication since this no  longer seems to be as effective.  Husband had COVID recently.   Past Psychiatric Medication Trials: Lexapro- Initially seemed to be helpful and took prior to pregnancy with daughter and re-started it when daughter was about 34 yo and took until August 2020. Zoloft- Not effective. May have felt "out of it." Prozac- Helped some with mood and then was no longer as effective. Wellbutrin- Not effective Lithium Buspar- Felt warm and somewhat nauseated Hydroxyzine- Caused drowsiness and did not help with anxiety Nortriptyline- Has been helpful for her mood Gabapentin- Felt disconnected with taking 300 mg BID and 600 mg QHS. Has been helpful for anxiety. Klonopin- Was taking 0.25 mg prn and needing to take more since move to  Klonopin ODT Xanax XR    Review of Systems:  Review of Systems  Gastrointestinal:  Positive for nausea.  Musculoskeletal:  Negative for gait problem.  Neurological:  Negative for tremors and speech difficulty.       Denies difficulty with balance  Psychiatric/Behavioral:         Please refer to HPI   Medications: I have reviewed the patient's current medications.  Current Outpatient Medications  Medication Sig Dispense Refill   lurasidone (LATUDA) 20 MG TABS tablet Take 1 tablet (20 mg total) by mouth daily with supper for 7 days. 7 tablet 0   [START ON 01/20/2021] lurasidone (LATUDA) 40 MG TABS tablet Take 1 tablet (40 mg total) by mouth daily with supper. 28 tablet 0   [START ON 01/24/2021] clonazePAM (KLONOPIN) 1  MG tablet Take 1/2-1 tab po TID prn anxiety 90 tablet 2   gabapentin (NEURONTIN) 300 MG capsule Decrease to twice daily for one week, then decrease to 1 capsule at bedtime for one week, then stop 270 capsule 1   lithium carbonate 300 MG capsule Take 2 capsules (600 mg total) by mouth at bedtime. 90 capsule 1   Melatonin 10 MG TABS Take by mouth at bedtime as needed.     nortriptyline (PAMELOR) 50 MG capsule Take 1 capsule (50 mg total) by mouth at bedtime.  30 capsule 1   propranolol (INDERAL) 40 MG tablet Take 1 tablet (40 mg total) by mouth 3 (three) times daily. 90 tablet 2   SENNA PO Take by mouth as needed.     tamsulosin (FLOMAX) 0.4 MG CAPS capsule Take by mouth.     VITAMIN D PO Take by mouth.     No current facility-administered medications for this visit.    Medication Side Effects: None  Allergies:  Allergies  Allergen Reactions   Ultram [Tramadol] Rash    Past Medical History:  Diagnosis Date   Anxiety    Back pain    IBS (irritable bowel syndrome)    Social anxiety disorder     Past Medical History, Surgical history, Social history, and Family history were reviewed and updated as appropriate.   Please see review of systems for further details on the patient's review from today.   Objective:   Physical Exam:  There were no vitals taken for this visit.  Physical Exam Constitutional:      General: She is not in acute distress. Musculoskeletal:        General: No deformity.  Neurological:     Mental Status: She is alert and oriented to person, place, and time.     Coordination: Coordination normal.  Psychiatric:        Attention and Perception: Attention and perception normal. She does not perceive auditory or visual hallucinations.        Mood and Affect: Mood is anxious. Mood is not depressed. Affect is not labile, blunt, angry or inappropriate.        Speech: Speech normal.        Behavior: Behavior normal.        Thought Content: Thought content normal. Thought content is not paranoid or delusional. Thought content does not include homicidal or suicidal ideation. Thought content does not include homicidal or suicidal plan.        Cognition and Memory: Cognition and memory normal.        Judgment: Judgment normal.     Comments: Insight intact    Lab Review:     Component Value Date/Time   NA 136 01/12/2021 1045   K 4.2 01/12/2021 1045   CL 102 01/12/2021 1045   CO2 27 01/12/2021 1045   GLUCOSE 95  01/12/2021 1045   BUN 13 01/12/2021 1045   CREATININE 1.25 (H) 01/12/2021 1045   CALCIUM 9.2 01/12/2021 1045   PROT 6.8 02/26/2007 1243   ALBUMIN 4.2 02/26/2007 1243   AST 16 02/26/2007 1243   ALT 9 02/26/2007 1243   ALKPHOS 33 (L) 02/26/2007 1243   BILITOT 0.9 02/26/2007 1243   GFRNONAA >60 06/30/2010 1151   GFRAA  06/30/2010 1151    >60        The eGFR has been calculated using the MDRD equation. This calculation has not been validated in all clinical situations. eGFR's persistently <60 mL/min signify possible Chronic  Kidney Disease.       Component Value Date/Time   WBC 9.1 04/05/2010 1517   RBC 4.47 04/05/2010 1517   HGB 13.9 06/30/2010 1151   HCT 39.2 06/30/2010 1151   PLT 203 04/05/2010 1517   MCV 91.8 04/05/2010 1517   MCH 31.6 04/05/2010 1517   MCHC 34.5 04/05/2010 1517   RDW 13.4 04/05/2010 1517   LYMPHSABS 2.3 04/05/2010 1517   MONOABS 0.6 04/05/2010 1517   EOSABS 0.2 04/05/2010 1517   BASOSABS 0.0 04/05/2010 1517    Lithium Lvl  Date Value Ref Range Status  01/12/2021 1.2 0.6 - 1.2 mmol/L Final     No results found for: PHENYTOIN, PHENOBARB, VALPROATE, CBMZ   .res Assessment: Plan:    Patient seen for 30 minutes and time spent counseling patient regarding treatment options and discussing lab results.  Discussed that her creatinine levels have been increasing and recommend reducing lithium dose.  Discussed that her lithium level was also on the upper end of normal range and recommend reducing lithium to a lower level.  Will decrease lithium to 600 mg at bedtime. Discussed alternative treatment strategies to help stabilize mood.  Discussed potential benefits, risks, and side effects of Latuda. Discussed potential metabolic side effects associated with atypical antipsychotics, as well as potential risk for movement side effects. Advised pt to contact office if movement side effects occur.  She agrees to trial of [place]. Will start Latuda 20 mg daily with  supper for 7 days, then increase to 40 mg daily with supper for mood signs and symptoms.  Discussed considering further dose reduction in lithium if mood signs and symptoms stabilized with Latuda. Will continue Klonopin 1 mg 3 times daily as needed.  Discussed considering changing Klonopin to an alternative medication at next visit after response to [place] is determined. Continue nortriptyline 50 mg at bedtime for mood signs and symptoms. Continue propanolol 40 mg 3 times daily for anxiety. Patient to follow-up with this provider in 2 weeks or sooner if clinically indicated. Recommend continuing therapy with Lina Sayre, [facility] C. Patient advised to contact office with any questions, adverse effects, or acute worsening in signs and symptoms.    Amanda Rogers was seen today for anxiety and other.  Diagnoses and all orders for this visit:  Episodic mood disorder (HCC) -     lurasidone (LATUDA) 20 MG TABS tablet; Take 1 tablet (20 mg total) by mouth daily with supper for 7 days. -     lurasidone (LATUDA) 40 MG TABS tablet; Take 1 tablet (40 mg total) by mouth daily with supper.  Depression, unspecified depression type -     lithium carbonate 300 MG capsule; Take 2 capsules (600 mg total) by mouth at bedtime.    Please see After Visit Summary for patient specific instructions.  Future Appointments  Date Time Provider [place]  01/25/2021 11:00 AM Lina Sayre, [facility] CP-CP None  02/24/2021  1:00 PM Lina Sayre, [facility] CP-CP None    No orders of the defined types were placed in this encounter.   -------------------------------

## 2021-01-14 MED ORDER — LITHIUM CARBONATE 300 MG PO CAPS: 600.0000 mg | ORAL_CAPSULE | Freq: Every day | ORAL | 1 refills | Status: DC

## 2021-01-14 MED ORDER — LURASIDONE HCL 40 MG PO TABS: 40.0000 mg | ORAL_TABLET | Freq: Every day | ORAL | 0 refills | Status: DC

## 2021-01-14 MED ORDER — LATUDA 20 MG PO TABS: 20.0000 mg | ORAL_TABLET | Freq: Every day | ORAL | 0 refills | Status: DC

## 2021-01-25 ENCOUNTER — Ambulatory Visit: Payer: 59 | Admitting: Psychiatry

## 2021-01-28 ENCOUNTER — Encounter: Payer: Self-pay | Admitting: Psychiatry

## 2021-01-28 ENCOUNTER — Ambulatory Visit: Payer: 59 | Admitting: Psychiatry

## 2021-01-28 ENCOUNTER — Other Ambulatory Visit: Payer: Self-pay

## 2021-01-28 DIAGNOSIS — F39 Unspecified mood [affective] disorder: Secondary | ICD-10-CM | POA: Diagnosis not present

## 2021-01-28 DIAGNOSIS — F4001 Agoraphobia with panic disorder: Secondary | ICD-10-CM | POA: Diagnosis not present

## 2021-01-28 DIAGNOSIS — F4010 Social phobia, unspecified: Secondary | ICD-10-CM

## 2021-01-28 MED ORDER — REXULTI 0.5 MG PO TABS: 0.5000 mg | ORAL_TABLET | ORAL | 0 refills | Status: DC

## 2021-01-28 NOTE — Progress Notes (Signed)
Amanda Rogers [id number] August 08, 1980 40 y.o.  Subjective:   Patient ID:  Amanda Rogers is a 40 y.o. (DOB 05/13/1981) female.  Chief Complaint:  Chief Complaint  Patient presents with   Medication Problem    Akathisia   Anxiety    HPI Amanda Rogers presents to the office today for follow-up of anxiety and mood disturbance. She reports that within an hour after taking Latuda she has to get back up and do things and feels very restless and has difficulty falling asleep. She reports feeling very tired the next day due to limited sleep. She reports, "I feel agitated a little bit on it." She reports that restlessness seemed to increase after increasing dose.   She reports fatigue due to interrupted sleep. Denies depressed mood. Denies impulsive or risky behavior. She reports that her anxiety has been "about the same." She reports that she is having panic s/s upon awakening with heart racing. She reports some days she may have 1-2 more panic attacks in response to anxious thoughts. She reports frequent worry and "over-thinking." Avoids leaving the house and continues to do grocery pick up. She has difficulty concentrating to read. Increased difficulty with concentration when anxious. Appetite has been good. She has been interested in things and ordered a new lens for her camera in anticipation of getting out to take pictures. Denies SI.    Past Psychiatric Medication Trials: Lexapro- Initially seemed to be helpful and took prior to pregnancy with daughter and re-started it when daughter was about 43 yo and took until August 2020. Zoloft- Not effective. May have felt "out of it." Prozac- Helped some with mood and then was no longer as effective. Wellbutrin- Not effective Lithium Latuda- akathisia Buspar- Felt warm and somewhat nauseated Hydroxyzine- Caused drowsiness and did not help with anxiety Nortriptyline- Has been helpful for her mood Gabapentin- Felt disconnected with taking 300 mg  BID and 600 mg QHS. Has been helpful for anxiety. Klonopin- Was taking 0.25 mg prn and needing to take more since move to [place] Klonopin ODT Xanax XR  Review of Systems:  Review of Systems  Gastrointestinal: Negative.   Musculoskeletal:  Negative for gait problem.  Neurological:  Negative for tremors.  Psychiatric/Behavioral:         Please refer to HPI   Reports thyroid is now WNL.   Medications: I have reviewed the patient's current medications.  Current Outpatient Medications  Medication Sig Dispense Refill   Brexpiprazole (REXULTI) 0.5 MG TABS Take 1 tablet (0.5 mg total) by mouth every other day. 21 tablet 0   clonazePAM (KLONOPIN) 1 MG tablet Take 1/2-1 tab po TID prn anxiety 90 tablet 2   lithium carbonate 300 MG capsule Take 2 capsules (600 mg total) by mouth at bedtime. 90 capsule 1   Melatonin 10 MG TABS Take by mouth at bedtime as needed.     SENNA PO Take by mouth as needed.     VITAMIN D PO Take by mouth.     propranolol (INDERAL) 40 MG tablet Take 1 tablet (40 mg total) by mouth 3 (three) times daily. 90 tablet 2   tamsulosin (FLOMAX) 0.4 MG CAPS capsule Take by mouth. (Patient not taking: Reported on 01/28/2021)     No current facility-administered medications for this visit.    Medication Side Effects: Other: Akathisia  Allergies:  Allergies  Allergen Reactions   Ultram [Tramadol] Rash    Past Medical History:  Diagnosis Date   Anxiety  Back pain    IBS (irritable bowel syndrome)    Social anxiety disorder     Past Medical History, Surgical history, Social history, and Family history were reviewed and updated as appropriate.   Please see review of systems for further details on the patient's review from today.   Objective:   Physical Exam:  There were no vitals taken for this visit.  Physical Exam Constitutional:      General: She is not in acute distress. Musculoskeletal:        General: No deformity.  Neurological:     Mental Status: She  is alert and oriented to person, place, and time.     Coordination: Coordination normal.  Psychiatric:        Attention and Perception: Attention and perception normal. She does not perceive auditory or visual hallucinations.        Mood and Affect: Mood normal. Mood is not anxious or depressed. Affect is not labile, blunt, angry or inappropriate.        Speech: Speech normal.        Behavior: Behavior normal.        Thought Content: Thought content normal. Thought content is not paranoid or delusional. Thought content does not include homicidal or suicidal ideation. Thought content does not include homicidal or suicidal plan.        Cognition and Memory: Cognition and memory normal.        Judgment: Judgment normal.     Comments: Insight intact    Lab Review:     Component Value Date/Time   NA 136 01/12/2021 1045   K 4.2 01/12/2021 1045   CL 102 01/12/2021 1045   CO2 27 01/12/2021 1045   GLUCOSE 95 01/12/2021 1045   BUN 13 01/12/2021 1045   CREATININE 1.25 (H) 01/12/2021 1045   CALCIUM 9.2 01/12/2021 1045   PROT 6.8 02/26/2007 1243   ALBUMIN 4.2 02/26/2007 1243   AST 16 02/26/2007 1243   ALT 9 02/26/2007 1243   ALKPHOS 33 (L) 02/26/2007 1243   BILITOT 0.9 02/26/2007 1243   GFRNONAA >60 06/30/2010 1151   GFRAA  06/30/2010 1151    >60        The eGFR has been calculated using the MDRD equation. This calculation has not been validated in all clinical situations. eGFR's persistently <60 mL/min signify possible Chronic Kidney Disease.       Component Value Date/Time   WBC 9.1 04/05/2010 1517   RBC 4.47 04/05/2010 1517   HGB 13.9 06/30/2010 1151   HCT 39.2 06/30/2010 1151   PLT 203 04/05/2010 1517   MCV 91.8 04/05/2010 1517   MCH 31.6 04/05/2010 1517   MCHC 34.5 04/05/2010 1517   RDW 13.4 04/05/2010 1517   LYMPHSABS 2.3 04/05/2010 1517   MONOABS 0.6 04/05/2010 1517   EOSABS 0.2 04/05/2010 1517   BASOSABS 0.0 04/05/2010 1517    Lithium Lvl  Date Value Ref Range  Status  01/12/2021 1.2 0.6 - 1.2 mmol/L Final     No results found for: PHENYTOIN, PHENOBARB, VALPROATE, CBMZ   .res Assessment: Plan:   Patient seen for 30 minutes and time spent discussing that she is experiencing akathisia, which is an adverse reaction to atypical antipsychotics that causes significant restlessness.  Discussed that akathisia is typically dose related and may improve with decrease in dose.  Discussed option of reducing Latuda to 20 mg daily or switching to a different medication.  Patient reports that she would prefer to change medications.  Discussed potential benefits, risks, and side effects of other possible treatment options to include Rexulti.  Discussed that Rexulti is approved for add on for major depression and is not indicated for anxiety, however some people will report improved anxiety signs and symptoms with Rexulti.  She agrees to trial of Rexulti.  Discussed not starting Rexulti until akathisia has completely resolved after stopping Latuda.  Will start Rexulti 0.5 mg every other day to minimize risk of akathisia. Will continue lithium at 600 mg dose at this time due to other changes in medication and will consider further dose reduction in the future. Continue Klonopin 1 mg 1/2 to 1 tablet 3 times daily as needed for anxiety. Continue propranolol 40 mg 3 times daily for anxiety. Recommend continuing psychotherapy with Amanda Rogers, [facility] C. Patient to follow-up with this provider in 4 weeks or sooner if clinically indicated. Patient advised to contact office with any questions, adverse effects, or acute worsening in signs and symptoms.  Amanda Rogers was seen today for medication problem and anxiety.  Diagnoses and all orders for this visit:  Episodic mood disorder (HCC) -     Brexpiprazole (REXULTI) 0.5 MG TABS; Take 1 tablet (0.5 mg total) by mouth every other day.  Panic disorder with agoraphobia  Social anxiety disorder    Please see After Visit Summary for  patient specific instructions.  Future Appointments  Date Time Provider [place]  02/24/2021  1:00 PM Amanda Rogers, [facility] CP-CP None  02/25/2021 11:00 AM Thayer Headings, [facility] CP-CP None    No orders of the defined types were placed in this encounter.   -------------------------------

## 2021-02-24 ENCOUNTER — Other Ambulatory Visit: Payer: Self-pay

## 2021-02-24 ENCOUNTER — Ambulatory Visit (INDEPENDENT_AMBULATORY_CARE_PROVIDER_SITE_OTHER): Payer: 59 | Admitting: Psychiatry

## 2021-02-24 ENCOUNTER — Encounter: Payer: Self-pay | Admitting: Psychiatry

## 2021-02-24 DIAGNOSIS — F4001 Agoraphobia with panic disorder: Secondary | ICD-10-CM | POA: Diagnosis not present
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
## 2021-02-25 ENCOUNTER — Ambulatory Visit: Payer: 59 | Admitting: Psychiatry

## 2021-02-25 ENCOUNTER — Encounter: Payer: Self-pay | Admitting: Psychiatry

## 2021-02-25 DIAGNOSIS — F4001 Agoraphobia with panic disorder: Secondary | ICD-10-CM | POA: Diagnosis not present

## 2021-02-25 DIAGNOSIS — F32A Depression, unspecified: Secondary | ICD-10-CM

## 2021-02-25 DIAGNOSIS — F39 Unspecified mood [affective] disorder: Secondary | ICD-10-CM | POA: Diagnosis not present

## 2021-02-25 MED ORDER — CLONAZEPAM 1 MG PO TABS: ORAL_TABLET | ORAL | 2 refills | Status: DC

## 2021-02-25 MED ORDER — LITHIUM CARBONATE 300 MG PO CAPS: 600.0000 mg | ORAL_CAPSULE | Freq: Every day | ORAL | 1 refills | Status: DC

## 2021-02-25 MED ORDER — PROPRANOLOL HCL ER 120 MG PO CP24: 120.0000 mg | ORAL_CAPSULE | Freq: Every day | ORAL | 1 refills | Status: DC

## 2021-02-25 MED ORDER — BREXPIPRAZOLE 1 MG PO TABS: 1.0000 mg | ORAL_TABLET | Freq: Every day | ORAL | 1 refills | Status: DC

## 2021-02-25 NOTE — Progress Notes (Signed)
ATLANTA PELTO [id number] 11-23-1980 40 y.o.  Subjective:   Patient ID:  Amanda Rogers is a 40 y.o. (DOB July 26, 1980) female.  Chief Complaint:  Chief Complaint  Patient presents with   Anxiety   Depression   Sleeping Problem    HPI Amanda Rogers presents to the office today for follow-up of depression, anxiety, and insomnia. "I've been doing good, actually." She reports that she was taking Rexulti every other day and noticed that the days she did not take it she experienced anxious tics. She started taking it daily several days ago and the tics resolved. She notices an occasional "dip" when medication seems to wear off and has increased anxiety. Will notices her arms will tense up and will bear down.   She reports anxiety has improved. She reports that racing thoughts and catastrophic thoughts have decreased. She reports that she has been able to be "more in the moment" instead of thinking about what others are thinking and what could go wrong. Notices some panic s/s in the mornings. She reports awakening around 3-4 am and is "wide awake" and feels that her heart is racing although pulse is usually in the 80's and feels like she cannot take a deep breath. Does not recall dreams or nightmares. Reports that she is not able to return to sleep. Falls asleep around 9:30 pm because she feels exhausted. She reports that this has been a long-standing pattern for her. Energy "was ok" and is now feeling effects of interrupted/decreased sleep. Motivation has been good and reports doing more around the house. Started crocheting and crafting. Took mother shopping and has been getting out more. She reports "Overall I feel better than what I have in the past" in terms of mood. Denies depressed mood. Denies impulsivity. Concentration is adequate and reports improved focus and that she has been able to count things to work on crochet. Denies SI.   She reports that she was able to go to [employer]. She reports  that she was able to stand in line and ride the rides and was not able to do that a year ago. She reports that she was happy to be able to make those memories with family.   Put an offer in on a house and learned this was accepted.   Past Psychiatric Medication Trials: Lexapro- Initially seemed to be helpful and took prior to pregnancy with daughter and re-started it when daughter was about 62 yo and took until August 2020. Zoloft- Not effective. May have felt "out of it." Prozac- Helped some with mood and then was no longer as effective. Wellbutrin- Not effective Lithium Latuda- akathisia Buspar- Felt warm and somewhat nauseated Hydroxyzine- Caused drowsiness and did not help with anxiety Nortriptyline- Has been helpful for her mood Gabapentin- Felt disconnected with taking 300 mg BID and 600 mg QHS. Has been helpful for anxiety. Klonopin- Was taking 0.25 mg prn and needing to take more since move to [place] Klonopin ODT Xanax XR  Review of Systems:  Review of Systems  Gastrointestinal: Negative.   Genitourinary:  Negative for dysuria.       Recent UTI  Musculoskeletal:  Negative for gait problem.  Neurological:  Negative for dizziness, tremors and light-headedness.  Psychiatric/Behavioral:         Please refer to HPI   Medications: I have reviewed the patient's current medications.  Current Outpatient Medications  Medication Sig Dispense Refill   brexpiprazole (REXULTI) 1 MG TABS tablet Take 1 tablet (1 mg  total) by mouth daily. 30 tablet 1   propranolol ER (INDERAL LA) 120 MG 24 hr capsule Take 1 capsule (120 mg total) by mouth at bedtime. 30 capsule 1   SENNA PO Take by mouth as needed.     [START ON 03/23/2021] clonazePAM (KLONOPIN) 1 MG tablet Take 1/2-1 tab po TID prn anxiety 90 tablet 2   lithium carbonate 300 MG capsule Take 2 capsules (600 mg total) by mouth at bedtime. 90 capsule 1   tamsulosin (FLOMAX) 0.4 MG CAPS capsule Take by mouth. (Patient not taking: No sig reported)      VITAMIN D PO Take 5,000 Int'l Units/day by mouth.     No current facility-administered medications for this visit.    Medication Side Effects: None  Allergies:  Allergies  Allergen Reactions   Ultram [Tramadol] Rash    Past Medical History:  Diagnosis Date   Anxiety    Back pain    IBS (irritable bowel syndrome)    Social anxiety disorder     Past Medical History, Surgical history, Social history, and Family history were reviewed and updated as appropriate.   Please see review of systems for further details on the patient's review from today.   Objective:   Physical Exam:  There were no vitals taken for this visit.  Physical Exam Constitutional:      General: She is not in acute distress. Musculoskeletal:        General: No deformity.  Neurological:     Mental Status: She is alert and oriented to person, place, and time.     Coordination: Coordination normal.  Psychiatric:        Attention and Perception: Attention and perception normal. She does not perceive auditory or visual hallucinations.        Mood and Affect: Mood is anxious. Mood is not depressed. Affect is not labile, blunt, angry or inappropriate.        Speech: Speech normal.        Behavior: Behavior normal. Behavior is cooperative.        Thought Content: Thought content normal. Thought content is not paranoid or delusional. Thought content does not include homicidal or suicidal ideation. Thought content does not include homicidal or suicidal plan.        Cognition and Memory: Cognition and memory normal.        Judgment: Judgment normal.     Comments: Insight intact Mood is less anxious compared to the past.     Lab Review:     Component Value Date/Time   NA 136 01/12/2021 1045   K 4.2 01/12/2021 1045   CL 102 01/12/2021 1045   CO2 27 01/12/2021 1045   GLUCOSE 95 01/12/2021 1045   BUN 13 01/12/2021 1045   CREATININE 1.25 (H) 01/12/2021 1045   CALCIUM 9.2 01/12/2021 1045   PROT 6.8  02/26/2007 1243   ALBUMIN 4.2 02/26/2007 1243   AST 16 02/26/2007 1243   ALT 9 02/26/2007 1243   ALKPHOS 33 (L) 02/26/2007 1243   BILITOT 0.9 02/26/2007 1243   GFRNONAA >60 06/30/2010 1151   GFRAA  06/30/2010 1151    >60        The eGFR has been calculated using the MDRD equation. This calculation has not been validated in all clinical situations. eGFR's persistently <60 mL/min signify possible Chronic Kidney Disease.       Component Value Date/Time   WBC 9.1 04/05/2010 1517   RBC 4.47 04/05/2010 1517   HGB  13.9 06/30/2010 1151   HCT 39.2 06/30/2010 1151   PLT 203 04/05/2010 1517   MCV 91.8 04/05/2010 1517   MCH 31.6 04/05/2010 1517   MCHC 34.5 04/05/2010 1517   RDW 13.4 04/05/2010 1517   LYMPHSABS 2.3 04/05/2010 1517   MONOABS 0.6 04/05/2010 1517   EOSABS 0.2 04/05/2010 1517   BASOSABS 0.0 04/05/2010 1517    Lithium Lvl  Date Value Ref Range Status  01/12/2021 1.2 0.6 - 1.2 mmol/L Final     No results found for: PHENYTOIN, PHENOBARB, VALPROATE, CBMZ   .res Assessment: Plan:   Pt seen for 30 minutes and time spent discussing treatment options. Discussed option of either continuing Rexulti 0.5 mg or increasing to 1 mg po qd. She reports that she would like to increase Rexulti. Recommended continuing Rexulti 0.5 mg po qd for a full week then increasing to 1 mg po qd for mood and anxiety.  Will change Propranolol to Propranolol ER 120 mg po QHS to possibly prevent breakthrough anxiety in the early morning hours.  Continue Lithium 600 mg po QHS for depression. Continue Klonopin 1 mg 1/2-1 tab po TID prn anxiety.  Pt to follow-up in 4 weeks or sooner if clinically indicated.  Patient advised to contact office with any questions, adverse effects, or acute worsening in signs and symptoms.   Fedra was seen today for anxiety, depression and sleeping problem.  Diagnoses and all orders for this visit:  Episodic mood disorder ([place]) -     brexpiprazole (REXULTI) 1 MG  TABS tablet; Take 1 tablet (1 mg total) by mouth daily.  Panic disorder with agoraphobia -     propranolol ER (INDERAL LA) 120 MG 24 hr capsule; Take 1 capsule (120 mg total) by mouth at bedtime. -     clonazePAM (KLONOPIN) 1 MG tablet; Take 1/2-1 tab po TID prn anxiety  Depression, unspecified depression type -     lithium carbonate 300 MG capsule; Take 2 capsules (600 mg total) by mouth at bedtime.    Please see After Visit Summary for patient specific instructions.  Future Appointments  Date Time Provider [place]  03/25/2021 10:00 AM Thayer Headings, PM[facility]NP CP-CP None  04/21/2021 10:00 AM Lina Sayre, [facility] CP-CP None  05/26/2021 11:00 AM Lina Sayre, [facility] None    No orders of the defined types were placed in this encounter.   -------------------------------

## 2021-03-16 ENCOUNTER — Telehealth: Payer: Self-pay | Admitting: Psychiatry

## 2021-03-16 NOTE — Telephone Encounter (Signed)
Please review

## 2021-03-16 NOTE — Telephone Encounter (Signed)
Please pull a copay card and 2 boxes of Rexulti 1 mg for her. The copay card can also be downloaded from Rexulti's website.

## 2021-03-16 NOTE — Telephone Encounter (Signed)
Pt is taking Rexaulti. She has misplaced the coupon card and would like to know if she can come to the office tomorrow and get a new one.  Also may she get a few more samples.  She has enough meds to get her the 3rd but her appt with Shanda Bumps isn't until the 7th.  She would like to come tomorrow.  I told her to call before she comes to make sure we have gotten an answer.

## 2021-03-16 NOTE — Telephone Encounter (Signed)
Pt informed

## 2021-03-25 ENCOUNTER — Ambulatory Visit: Payer: 59 | Admitting: Psychiatry

## 2021-03-25 ENCOUNTER — Other Ambulatory Visit: Payer: Self-pay

## 2021-03-25 ENCOUNTER — Encounter: Payer: Self-pay | Admitting: Psychiatry

## 2021-03-25 DIAGNOSIS — G4700 Insomnia, unspecified: Secondary | ICD-10-CM | POA: Diagnosis not present

## 2021-03-25 DIAGNOSIS — F32A Depression, unspecified: Secondary | ICD-10-CM | POA: Diagnosis not present

## 2021-03-25 DIAGNOSIS — F39 Unspecified mood [affective] disorder: Secondary | ICD-10-CM

## 2021-03-25 DIAGNOSIS — F4001 Agoraphobia with panic disorder: Secondary | ICD-10-CM | POA: Diagnosis not present

## 2021-03-25 MED ORDER — LITHIUM CARBONATE 300 MG PO CAPS: 600.0000 mg | ORAL_CAPSULE | Freq: Every day | ORAL | 0 refills | Status: DC

## 2021-03-25 MED ORDER — TRAZODONE HCL 100 MG PO TABS: ORAL_TABLET | ORAL | 2 refills | Status: DC

## 2021-03-25 MED ORDER — PROPRANOLOL HCL ER 120 MG PO CP24: 120.0000 mg | ORAL_CAPSULE | Freq: Every day | ORAL | 1 refills | Status: DC

## 2021-03-25 NOTE — Progress Notes (Signed)
Amanda Rogers [id number] 06/17/1981 40 y.o.  Subjective:   Patient ID:  Amanda Rogers is a 40 y.o. (DOB 13-Apr-1981) female.  Chief Complaint:  Chief Complaint  Patient presents with   Sleeping Problem   Anxiety   Other    Mood disturbance     HPI Amanda Rogers presents to the office today for follow-up of sleep disturbance, anxiety, and mood disturbance. She reports that she has not had any akathisia with increase in [place]. She reports "mentally I will feel like I need to be doing something" at times and can do something with her hands. She reports that increased dose has "kept me more even" and notices less "dips" in mood. She noticed some mild depression earlier this week and this coincided with the onset of menses. Denies any elevated moods. She reports that she got back on an app for a few days and then got back off. Denies any other impulsive behavior or bursts in energy. She noticed she was communicating faster during that time.   Sleep has been decreased for 2 months. She reports that she feels exhausted at 8:45 pm. Has had some middle of night awakenings (2:30-3 am) and will take Klonopin since she has increased HR. Does not recall dreams or nightmares. She reports that she is usually not able to return to sleep. She reports that Propranolol ER "has helped some" during the day and no longer experiences breakthrough s/s, however it has not helped with keeping her asleep.   She is working on taking Klonopin at first sign of panic. Reports anxiety has been "better a little bit." Has been able to do more things outside of the house, even alone. Took her dogs to the park and went to nail salon alone. Has been able to make quick grocery shopping alone and has some anxiety. Continues to use grocery pick up for large shopping orders. She has had some worry in response to offer and closing on house. She reports some other worries have been less.   They are in the process of closing on  a house. Energy and motivation have been ok. Appetite has been good and reports that she has gained 2 lbs. She reports that they recently were eating out more. Concentration and focus have been good. Has not had the urge to read and is "not sure why that is." Denies SI.     Past Psychiatric Medication Trials: Lexapro- Initially seemed to be helpful and took prior to pregnancy with daughter and re-started it when daughter was about 70 yo and took until August 2020. Zoloft- Not effective. May have felt "out of it." Prozac- Helped some with mood and then was no longer as effective. Wellbutrin- Not effective Lithium Latuda- akathisia Buspar- Felt warm and somewhat nauseated Hydroxyzine- Caused drowsiness and did not help with anxiety Nortriptyline- Has been helpful for her mood Gabapentin- Felt disconnected with taking 300 mg BID and 600 mg QHS. Has been helpful for anxiety. Klonopin- Was taking 0.25 mg prn and needing to take more since move to [place] Klonopin ODT Xanax XR  Review of Systems:  Review of Systems  Genitourinary: Negative.   Musculoskeletal:  Negative for gait problem.  Neurological:  Negative for tremors.  Psychiatric/Behavioral:         Please refer to HPI   Medications: I have reviewed the patient's current medications.  Current Outpatient Medications  Medication Sig Dispense Refill   brexpiprazole (REXULTI) 1 MG TABS tablet Take 1 tablet (1  mg total) by mouth daily. 30 tablet 1   clonazePAM (KLONOPIN) 1 MG tablet Take 1/2-1 tab po TID prn anxiety 90 tablet 2   SENNA PO Take by mouth as needed.     traZODone (DESYREL) 100 MG tablet Take 1/2-1 tablet po QHS prn insomnia 30 tablet 2   VITAMIN D PO Take 5,000 Int'l Units/day by mouth.     lithium carbonate 300 MG capsule Take 2 capsules (600 mg total) by mouth at bedtime. 180 capsule 0   propranolol ER (INDERAL LA) 120 MG 24 hr capsule Take 1 capsule (120 mg total) by mouth at bedtime. 90 capsule 1   tamsulosin (FLOMAX) 0.4  MG CAPS capsule Take by mouth. (Patient not taking: No sig reported)     No current facility-administered medications for this visit.    Medication Side Effects: Other: Occ feeling that she needs to do something  Allergies:  Allergies  Allergen Reactions   Ultram [Tramadol] Rash    Past Medical History:  Diagnosis Date   Anxiety    Back pain    IBS (irritable bowel syndrome)    Social anxiety disorder     Past Medical History, Surgical history, Social history, and Family history were reviewed and updated as appropriate.   Please see review of systems for further details on the patient's review from today.   Objective:   Physical Exam:  There were no vitals taken for this visit.  Physical Exam Constitutional:      General: She is not in acute distress. Musculoskeletal:        General: No deformity.  Neurological:     Mental Status: She is alert and oriented to person, place, and time.     Coordination: Coordination normal.  Psychiatric:        Attention and Perception: Attention and perception normal. She does not perceive auditory or visual hallucinations.        Mood and Affect: Mood is anxious. Mood is not depressed. Affect is not labile, blunt, angry or inappropriate.        Speech: Speech normal.        Behavior: Behavior normal.        Thought Content: Thought content normal. Thought content is not paranoid or delusional. Thought content does not include homicidal or suicidal ideation. Thought content does not include homicidal or suicidal plan.        Cognition and Memory: Cognition and memory normal.        Judgment: Judgment normal.     Comments: Insight intact    Lab Review:     Component Value Date/Time   NA 136 01/12/2021 1045   K 4.2 01/12/2021 1045   CL 102 01/12/2021 1045   CO2 27 01/12/2021 1045   GLUCOSE 95 01/12/2021 1045   BUN 13 01/12/2021 1045   CREATININE 1.25 (H) 01/12/2021 1045   CALCIUM 9.2 01/12/2021 1045   PROT 6.8 02/26/2007 1243    ALBUMIN 4.2 02/26/2007 1243   AST 16 02/26/2007 1243   ALT 9 02/26/2007 1243   ALKPHOS 33 (L) 02/26/2007 1243   BILITOT 0.9 02/26/2007 1243   GFRNONAA >60 06/30/2010 1151   GFRAA  06/30/2010 1151    >60        The eGFR has been calculated using the MDRD equation. This calculation has not been validated in all clinical situations. eGFR's persistently <60 mL/min signify possible Chronic Kidney Disease.       Component Value Date/Time   WBC 9.1  04/05/2010 1517   RBC 4.47 04/05/2010 1517   HGB 13.9 06/30/2010 1151   HCT 39.2 06/30/2010 1151   PLT 203 04/05/2010 1517   MCV 91.8 04/05/2010 1517   MCH 31.6 04/05/2010 1517   MCHC 34.5 04/05/2010 1517   RDW 13.4 04/05/2010 1517   LYMPHSABS 2.3 04/05/2010 1517   MONOABS 0.6 04/05/2010 1517   EOSABS 0.2 04/05/2010 1517   BASOSABS 0.0 04/05/2010 1517    Lithium Lvl  Date Value Ref Range Status  01/12/2021 1.2 0.6 - 1.2 mmol/L Final     No results found for: PHENYTOIN, PHENOBARB, VALPROATE, CBMZ   .res Assessment: Plan:   Pt seen for 30 minutes and time spent discussing treatment options for sleep disturbance. Discussed potential benefits, risks, and side effects of Trazodone. Pt agrees to trial of Trazodone. Will start Trazodone 100 mg 1/2-1 tab po QHS prn insomnia.  Will continue Propranolol ER 120 mg at bedtime for anxiety.  Continue Rexulti 1 mg po qd for mood s/s. Continue Lithium 600 mg po QHS for mood s/s.  Continue Klonopin 1 mg 1/2-1 tablet three times daily as needed for anxiety.  Recommend continuing therapy with Lina Sayre, [facility].  Pt to follow-up with this provider in 2 months or sooner if clinically indicated.  Patient advised to contact office with any questions, adverse effects, or acute worsening in signs and symptoms.   Amanda Rogers was seen today for sleeping problem, anxiety and other.  Diagnoses and all orders for this visit:  Insomnia, unspecified type -     traZODone (DESYREL) 100 MG tablet; Take  1/2-1 tablet po QHS prn insomnia  Episodic mood disorder ([facility])  Depression, unspecified depression type -     lithium carbonate 300 MG capsule; Take 2 capsules (600 mg total) by mouth at bedtime.  Panic disorder with agoraphobia -     propranolol ER (INDERAL LA) 120 MG 24 hr capsule; Take 1 capsule (120 mg total) by mouth at bedtime.    Please see After Visit Summary for patient specific instructions.  Future Appointments  Date Time Provider [place]  04/21/2021 10:00 AM Lina Sayre, [facility] CP-CP None  05/20/2021 10:30 AM Thayer Headings, PMHNP CP-CP None  05/26/2021 11:00 AM Lina Sayre, [facility] CP-CP None    No orders of the defined types were placed in this encounter.   -------------------------------

## 2021-04-21 ENCOUNTER — Ambulatory Visit: Payer: 59 | Admitting: Psychiatry

## 2021-05-20 ENCOUNTER — Encounter: Payer: Self-pay | Admitting: Psychiatry

## 2021-05-20 ENCOUNTER — Other Ambulatory Visit: Payer: Self-pay

## 2021-05-20 ENCOUNTER — Ambulatory Visit (INDEPENDENT_AMBULATORY_CARE_PROVIDER_SITE_OTHER): Payer: 59 | Admitting: Psychiatry

## 2021-05-20 DIAGNOSIS — F32A Depression, unspecified: Secondary | ICD-10-CM

## 2021-05-20 DIAGNOSIS — G4700 Insomnia, unspecified: Secondary | ICD-10-CM | POA: Diagnosis not present

## 2021-05-20 DIAGNOSIS — F39 Unspecified mood [affective] disorder: Secondary | ICD-10-CM

## 2021-05-20 DIAGNOSIS — F4001 Agoraphobia with panic disorder: Secondary | ICD-10-CM | POA: Diagnosis not present

## 2021-05-20 MED ORDER — BREXPIPRAZOLE 1 MG PO TABS: ORAL_TABLET | ORAL | 0 refills | Status: DC

## 2021-05-20 MED ORDER — LITHIUM CARBONATE 300 MG PO CAPS: 600.0000 mg | ORAL_CAPSULE | Freq: Every day | ORAL | 0 refills | Status: DC

## 2021-05-20 MED ORDER — TRAZODONE HCL 100 MG PO TABS: ORAL_TABLET | ORAL | 1 refills | Status: DC

## 2021-05-20 MED ORDER — CLONAZEPAM 1 MG PO TABS: ORAL_TABLET | ORAL | 2 refills | Status: DC

## 2021-05-20 NOTE — Progress Notes (Signed)
Amanda Rogers [id number] 03/22/1981 40 y.o.  Subjective:   Patient ID:  Amanda Rogers is a 40 y.o. (DOB 04-13-1981) female.  Chief Complaint:  Chief Complaint  Patient presents with   Other    Mood disturbance, anxiety     HPI Amanda Rogers presents to the office today for follow-up of mood disturbance, anxiety, and insomnia. "I think the holidays stirred up a lot of emotions, plus we were moving." Has noticed some increase in anxiety and depression. Noticed more agoraphobia and was almost tearful with having to go through a drive thru. Husband and daughter want to go to a hockey game tonight and is dreading this and has anticipatory anxiety about this. Notices some panic s/s (chest tightness, heart racing, dry mouth, thoughts racing, etc. Has some irritability with these s/s and the need to cry. Denies intrusive memories other than memory of father dying.   She reports that she had some possible manic s/s with periods of wanting to unpack all the boxes immediately and unable to sit still to the point of over-doing activity and causing her back to hurt. Has had some impulsive behaviors. She reports that she has some impulses to spend, however husband helps prevent this.   She reports that she has had some depression. Sleep schedule remains disrupted. She reports that she was staying up later. Waking up 4:30-5:30 instead of around 3:30 before Trazodone. Has been more tired recently and going to bed around 9 pm. Energy has been ok. Motivation has been ok. Concentration has been good and has been reading some without having to read things again. Has been crocheting. Enjoying some activities. Denies SI.   Has moved and loves the new house.   [place] Office Visit from 05/20/2021 in [facility]  AIMS Total Score 0       Past Psychiatric Medication Trials: Lexapro- Initially seemed to be helpful and took prior to pregnancy with daughter and re-started it  when daughter was about 81 yo and took until August 2020. Zoloft- Not effective. May have felt "out of it." Prozac- Helped some with mood and then was no longer as effective. Wellbutrin- Not effective Lithium Latuda- akathisia Rexulti Buspar- Felt warm and somewhat nauseated Hydroxyzine- Caused drowsiness and did not help with anxiety Nortriptyline- Has been helpful for her mood Gabapentin- Felt disconnected with taking 300 mg BID and 600 mg QHS. Has been helpful for anxiety. Klonopin- Was taking 0.25 mg prn and needing to take more since move to [place] Klonopin ODT Xanax XR  Review of Systems:  Review of Systems  HENT:  Positive for rhinorrhea.   Musculoskeletal:  Negative for gait problem.  Neurological:  Negative for tremors.  Psychiatric/Behavioral:         Please refer to HPI   Medications: I have reviewed the patient's current medications.  Current Outpatient Medications  Medication Sig Dispense Refill   propranolol ER (INDERAL LA) 120 MG 24 hr capsule Take 1 capsule (120 mg total) by mouth at bedtime. 90 capsule 1   SENNA PO Take by mouth as needed.     VITAMIN D PO Take 5,000 Int'l Units/day by mouth.     brexpiprazole (REXULTI) 1 MG TABS tablet Take 1 tablet every other day and 2 tablets every other day on alternating days 135 tablet 0   [START ON 06/17/2021] clonazePAM (KLONOPIN) 1 MG tablet Take 1/2-1 tab po TID prn anxiety 90 tablet 2   lithium carbonate 300  MG capsule Take 2 capsules (600 mg total) by mouth at bedtime. 180 capsule 0   tamsulosin (FLOMAX) 0.4 MG CAPS capsule Take by mouth. (Patient not taking: No sig reported)     traZODone (DESYREL) 100 MG tablet Take 1/2-1 tablet po QHS prn insomnia 90 tablet 1   No current facility-administered medications for this visit.    Medication Side Effects: Other: Possible rhinorrhea  Allergies:  Allergies  Allergen Reactions   Ultram [Tramadol] Rash    Past Medical History:  Diagnosis Date   Anxiety    Back pain     IBS (irritable bowel syndrome)    Social anxiety disorder     Past Medical History, Surgical history, Social history, and Family history were reviewed and updated as appropriate.   Please see review of systems for further details on the patient's review from today.   Objective:   Physical Exam:  There were no vitals taken for this visit.  Physical Exam Constitutional:      General: She is not in acute distress. Musculoskeletal:        General: No deformity.  Neurological:     Mental Status: She is alert and oriented to person, place, and time.     Coordination: Coordination normal.  Psychiatric:        Attention and Perception: Attention and perception normal. She does not perceive auditory or visual hallucinations.        Mood and Affect: Mood is anxious. Mood is not depressed. Affect is not labile, blunt, angry or inappropriate.        Speech: Speech normal.        Behavior: Behavior normal. Behavior is cooperative.        Thought Content: Thought content normal. Thought content is not paranoid or delusional. Thought content does not include homicidal or suicidal ideation. Thought content does not include homicidal or suicidal plan.        Cognition and Memory: Cognition and memory normal.        Judgment: Judgment normal.     Comments: Insight intact    Lab Review:     Component Value Date/Time   NA 136 01/12/2021 1045   K 4.2 01/12/2021 1045   CL 102 01/12/2021 1045   CO2 27 01/12/2021 1045   GLUCOSE 95 01/12/2021 1045   BUN 13 01/12/2021 1045   CREATININE 1.25 (H) 01/12/2021 1045   CALCIUM 9.2 01/12/2021 1045   PROT 6.8 02/26/2007 1243   ALBUMIN 4.2 02/26/2007 1243   AST 16 02/26/2007 1243   ALT 9 02/26/2007 1243   ALKPHOS 33 (L) 02/26/2007 1243   BILITOT 0.9 02/26/2007 1243   GFRNONAA >60 06/30/2010 1151   GFRAA  06/30/2010 1151    >60        The eGFR has been calculated using the MDRD equation. This calculation has not been validated in all  clinical situations. eGFR's persistently <60 mL/min signify possible Chronic Kidney Disease.       Component Value Date/Time   WBC 9.1 04/05/2010 1517   RBC 4.47 04/05/2010 1517   HGB 13.9 06/30/2010 1151   HCT 39.2 06/30/2010 1151   PLT 203 04/05/2010 1517   MCV 91.8 04/05/2010 1517   MCH 31.6 04/05/2010 1517   MCHC 34.5 04/05/2010 1517   RDW 13.4 04/05/2010 1517   LYMPHSABS 2.3 04/05/2010 1517   MONOABS 0.6 04/05/2010 1517   EOSABS 0.2 04/05/2010 1517   BASOSABS 0.0 04/05/2010 1517    Lithium Lvl  Date  Value Ref Range Status  01/12/2021 1.2 0.6 - 1.2 mmol/L Final     No results found for: PHENYTOIN, PHENOBARB, VALPROATE, CBMZ   .res Assessment: Plan:    Pt seen for 30 minutes and time spent discussing potential treatment options to include potential benefits, risks, and side effects of increasing Rexulti to further improve mood and anxiety since she has had a partial response to Rexulti 1 mg daily. Discussed option of increasing Rexulti to 2 mg every other day alternating with 1 mg every other day to minimize risk of akathisia. Pt is amenable to trial of increase in [place].  Continue Klonopin 1 mg po TID for anxiety.  Continue Lithium 600 mg at bedtime for mood s/s.  Continue Trazodone 100 mg 1/2-1 tab po QHS prn insomnia.  Pt to follow-up in 2 months or sooner if clinically indicated.  Patient advised to contact office with any questions, adverse effects, or acute worsening in signs and symptoms. Recommend continuing therapy with Lina Sayre, [facility].    Devota was seen today for other.  Diagnoses and all orders for this visit:  Episodic mood disorder ([place]) -     brexpiprazole (REXULTI) 1 MG TABS tablet; Take 1 tablet every other day and 2 tablets every other day on alternating days  Panic disorder with agoraphobia -     clonazePAM (KLONOPIN) 1 MG tablet; Take 1/2-1 tab po TID prn anxiety  Depression, unspecified depression type -     lithium carbonate 300 MG  capsule; Take 2 capsules (600 mg total) by mouth at bedtime.  Insomnia, unspecified type -     traZODone (DESYREL) 100 MG tablet; Take 1/2-1 tablet po QHS prn insomnia    Please see After Visit Summary for patient specific instructions.  Future Appointments  Date Time Provider [place]  05/26/2021 11:00 AM Lina Sayre, [facility] None  07/21/2021 11:00 AM Thayer Headings, [facility] CP[facility] None    No orders of the defined types were placed in this encounter.   -------------------------------

## 2021-05-26 ENCOUNTER — Ambulatory Visit: Payer: 59 | Admitting: Psychiatry

## 2021-05-26 ENCOUNTER — Other Ambulatory Visit: Payer: Self-pay

## 2021-05-26 DIAGNOSIS — F4001 Agoraphobia with panic disorder: Secondary | ICD-10-CM | POA: Diagnosis not present

## 2021-05-26 NOTE — Progress Notes (Signed)
      [facility] Counselor/Therapist Progress Note  Patient ID: JOSSETTE ZIRBEL, MRN: [id number],    Date: 05/26/2021  Time Spent: 59 minutes start time 11:01 AM end time 12 PM  Treatment Type: Individual Therapy  Reported Symptoms: grief issues, sadness, anxiety, panic, overwhelmed, irritability, sleep issues, flashbacks, rumination  Mental Status Exam:  Appearance:   Well Groomed     Behavior:  Appropriate  Motor:  Normal  Speech/Language:   Normal Rate  Affect:  Appropriate  Mood:  anxious  Thought process:  normal  Thought content:    WNL  Sensory/Perceptual disturbances:    WNL  Orientation:  oriented to person, place, time/date, and situation  Attention:  Good  Concentration:  Good  Memory:  WNL  Fund of knowledge:   Good  Insight:    Good  Judgment:   Good  Impulse Control:  Good   Risk Assessment: Danger to Self:  No Self-injurious Behavior: No Danger to Others: No Duty to Warn:no Physical Aggression / Violence:No  Access to Firearms a concern: No  Gang Involvement:No   Subjective: Patient was present for session.  She shared she had a hard time recently but due to sickness she has had to miss appointments. Her agoraphobia has been triggered and that has been very hard.She shared that she has been able to get through a few times out of the house.  Encouraged her to think through how she was able to get through the situations.  She was able to discuss what helped her.  Patient stated she wanted to work on seeing her dad decline in session, suds level 9, negative cognition "I should have realized it" felt sadness and guilt in her chest.  Patient was able to reduce suds level to 3.  Through the processing she was able to realize that her dad would not want her to hang onto the guilt and sadness and it is okay for her to move forward.  She was also able to remember several different very positive experiences she had with her father and she can focus on those things.   Discussed different ways that she can keep his memory alive for herself and her daughter over the Christmas holiday.  Interventions: Solution-Oriented/Positive Psychology, Insight-Oriented, and BS P  Diagnosis:   ICD-10-CM   1. Panic disorder with agoraphobia  F40.01       Plan: Patient is to use CBT and coping skills to decrease panic.  Patient is to focus on reminding herself of the positive times with her and her dad.  Patient is to follow plans from session to help keep her dad feeling present for herself and her daughter during the holiday season.  Patient is to take medication as directed. Long-term goal: Resolve the core conflict that is the source of anxiety-reduce panic by 50% Short-term goal: Identify the major life complex in the past and present that form the basis for present anxiety  Stevphen Meuse, [facility]

## 2021-05-30 ENCOUNTER — Telehealth: Payer: Self-pay | Admitting: Psychiatry

## 2021-05-30 ENCOUNTER — Encounter: Payer: Self-pay | Admitting: Psychiatry

## 2021-05-30 DIAGNOSIS — F4001 Agoraphobia with panic disorder: Secondary | ICD-10-CM

## 2021-05-30 DIAGNOSIS — F39 Unspecified mood [affective] disorder: Secondary | ICD-10-CM

## 2021-05-30 MED ORDER — BREXPIPRAZOLE 2 MG PO TABS: 2.0000 mg | ORAL_TABLET | Freq: Every day | ORAL | 0 refills | Status: DC

## 2021-05-30 MED ORDER — PROPRANOLOL HCL ER 60 MG PO CP24: 60.0000 mg | ORAL_CAPSULE | Freq: Two times a day (BID) | ORAL | 1 refills | Status: DC

## 2021-05-30 NOTE — Telephone Encounter (Signed)
Amanda Rogers at [facility] called and said they are confused about the script for the Rexulti.  They have a script for 1mg  and 2mg  and then the directions on how it's to be taken needs to be clarified for them.  Next appt. 2/2

## 2021-05-30 NOTE — Telephone Encounter (Signed)
You may have to write 2 separate RX's not sure insurance will cover that way but I will try to send a PA first

## 2021-05-30 NOTE — Addendum Note (Signed)
Addended by: Derenda Mis on: 05/30/2021 02:31 PM   Modules accepted: Orders

## 2021-05-30 NOTE — Telephone Encounter (Addendum)
Called pt to follow-up after her call. She reports that she has not seen any difference with increase in Rexulti so far in terms of benefits or side effects.   She reports that she is having panic attacks in the afternoon, evening, or right before bed. She reports that Klonopin has been helpful in the mornings and controlling panic. Reports feeling more overwhelmed and anxious in the afternoon. She reports that she did take Propranolol IR 40 mg prn on a few occasions with some relief. Discussed that she may be having breakthrough anxiety in the afternoon/evening due to Propranolol ER wearing off after taking it at bedtime the night before. Therefore will switch Propranolol ER from 120 mg at HS to 60 mg po BID.   Increase Rexulti 2 mg po qd to possibly further improve anxiety and depression.   Patient advised to contact office with any questions, adverse effects, or acute worsening in signs and symptoms.

## 2021-05-30 NOTE — Telephone Encounter (Signed)
See message from pharmacy. It took me a couple of times reading Rx before I figured you mean one day patient takes 1 mg tablet and the next day she takes 2 mg tablet, alternating days. If this is correct let me know and I'll clarify with pharmacy.

## 2021-05-30 NOTE — Telephone Encounter (Signed)
Pt called reporting Rx sent in on 12/2 for Rexulti needs PA for the way instructed to take. Insurance will pay for only 1 daily. Also, Pt mentioned she is still having panic attacks every evening. What can she take for that as well. Contact # [phone]

## 2021-05-31 ENCOUNTER — Telehealth: Payer: Self-pay | Admitting: Psychiatry

## 2021-05-31 MED ORDER — BREXPIPRAZOLE 2 MG PO TABS: 2.0000 mg | ORAL_TABLET | Freq: Every day | ORAL | 0 refills | Status: DC

## 2021-05-31 NOTE — Telephone Encounter (Signed)
Prior Authorization submitted yesterday for 1 mg Rexulti has been canceled, No PA was needed for 2 mg Rexulti #90. Rx has already been filled as well.

## 2021-05-31 NOTE — Telephone Encounter (Signed)
On her Rx from yesterday 2 mg Rexulti, it has 2 different instructions just want to verify suppose to be daily?

## 2021-05-31 NOTE — Telephone Encounter (Signed)
Rx was resubmitted  

## 2021-05-31 NOTE — Addendum Note (Signed)
Addended by: Derenda Mis on: 05/31/2021 12:21 PM   Modules accepted: Orders

## 2021-05-31 NOTE — Telephone Encounter (Signed)
Order clarification sent

## 2021-05-31 NOTE — Telephone Encounter (Signed)
[facility] called & needs clarification for Amanda Rogers's Rexulti. They need to know if she takes 1 tablet ever other day or two every other day on alternating days? They got two sets of directions so wanting to clarify. Their number is [phone].

## 2021-06-01 NOTE — Telephone Encounter (Signed)
Noted thank you

## 2021-06-28 ENCOUNTER — Telehealth: Payer: Self-pay | Admitting: Psychiatry

## 2021-06-28 NOTE — Telephone Encounter (Signed)
Called patient. She says she has been anxious all the time since starting Rexulti, but with the increase in dose the anxiety has worsened. I see she has agoraphobia and she is stating she always feels like she is on the verge of a panic attack and doesn't want to go anywhere. She doesn't She does not have SI. I asked if she had noticed any benefit with Rexulti. She said on the lower dose she felt maybe her depression had lessened, but with the increase in dose, which has increased her anxiety, the depression has again increased.

## 2021-06-28 NOTE — Telephone Encounter (Signed)
Did she say which dose was most helpful for her mood? Recommend stopping Rexulti for one week, since it has a long half-life and blood levels will gradually decrease, and then resume low dose that was effective. Please let me know if she needs a script for lower dose. If 1 mg was effective and well tolerated she could take it every other day or a 1/2 tab daily.

## 2021-06-28 NOTE — Telephone Encounter (Signed)
Pt called reporting Rexulti increase has increased anxiety. Should she stop or need to wean off? Contact # [phone].

## 2021-06-29 NOTE — Telephone Encounter (Signed)
Patient said the lower dose was better, but it still did not control her anxiety. She will try the recommendations given.

## 2021-07-21 ENCOUNTER — Ambulatory Visit: Payer: 59 | Admitting: Psychiatry

## 2021-07-21 ENCOUNTER — Encounter: Payer: Self-pay | Admitting: Psychiatry

## 2021-07-21 ENCOUNTER — Other Ambulatory Visit: Payer: Self-pay

## 2021-07-21 DIAGNOSIS — F32A Depression, unspecified: Secondary | ICD-10-CM

## 2021-07-21 DIAGNOSIS — F4001 Agoraphobia with panic disorder: Secondary | ICD-10-CM | POA: Diagnosis not present

## 2021-07-21 MED ORDER — CLONAZEPAM 1 MG PO TABS: ORAL_TABLET | ORAL | 2 refills | Status: DC

## 2021-07-21 MED ORDER — ESCITALOPRAM OXALATE 10 MG PO TABS: 10.0000 mg | ORAL_TABLET | Freq: Every day | ORAL | 1 refills | Status: DC

## 2021-07-21 MED ORDER — PROPRANOLOL HCL ER 60 MG PO CP24: 60.0000 mg | ORAL_CAPSULE | Freq: Two times a day (BID) | ORAL | 0 refills | Status: DC

## 2021-07-21 MED ORDER — LITHIUM CARBONATE 300 MG PO CAPS: 600.0000 mg | ORAL_CAPSULE | Freq: Every day | ORAL | 0 refills | Status: DC
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
## 2021-08-17 ENCOUNTER — Other Ambulatory Visit: Payer: Self-pay

## 2021-08-17 ENCOUNTER — Ambulatory Visit (INDEPENDENT_AMBULATORY_CARE_PROVIDER_SITE_OTHER): Payer: 59 | Admitting: Psychiatry

## 2021-08-17 ENCOUNTER — Encounter: Payer: Self-pay | Admitting: Psychiatry

## 2021-08-17 DIAGNOSIS — F4001 Agoraphobia with panic disorder: Secondary | ICD-10-CM | POA: Diagnosis not present

## 2021-08-17 DIAGNOSIS — F32A Depression, unspecified: Secondary | ICD-10-CM

## 2021-08-17 DIAGNOSIS — F4010 Social phobia, unspecified: Secondary | ICD-10-CM

## 2021-08-17 MED ORDER — FLUOXETINE HCL 10 MG PO CAPS: ORAL_CAPSULE | ORAL | 1 refills | Status: DC

## 2021-08-17 MED ORDER — ESCITALOPRAM OXALATE 5 MG PO TABS: ORAL_TABLET | ORAL | 0 refills | Status: DC

## 2021-08-17 NOTE — Progress Notes (Signed)
Amanda Rogers [id number] 12-01-1980 41 y.o.  Subjective:   Patient ID:  Amanda Rogers is a 41 y.o. (DOB 22-Sep-1980) female.  Chief Complaint:  Chief Complaint  Patient presents with   Depression   Anxiety    HPI Amanda Rogers presents to the office today for follow-up of anxiety, mood disturbance, and insomnia. She reports that she has not seen a change in mood or anxiety with Lexapro.   She reports persistent depressed mood. She reports depression has been increased the last 2 months. Energy and motivation have been low. She reports that she has been putting off showering and falling behind on chores. She reports that she went about 2 weeks without laundry. Diminished enjoyment in things. Enjoying reading and able to concentrate on this. Sleep has been slightly interrupted with new puppy. Has not been napping as much and is trying to resist this. She reports napping a couple of hours daily until the last few days. Appetite has been good. Denies SI.   She reports that her anxiety has been "a little bit better." Has had anxiety about family gathering. Occasionally getting out. Had anxiety when mother spontaneously decided to visit and felt on the verge of an anxiety attacks before mother arrived. She reports that she feels compelled to clean thoroughly before people to visit. She reports having 2-3 panic attacks weekly, mainly in the morning. She is unsure what triggers panic attacks. She reports that she feels anxious in the morning and feels overwhelmed by what she needs to accomplish. She reports anticipatory anxiety about upcoming appointments or events.   Denies any recent impulsivity.   No longer experiencing restlessness.  They got a new puppy on Valentine's day that is part [place].   Past Psychiatric Medication Trials: Lexapro- Initially seemed to be helpful and took prior to pregnancy with daughter and re-started it when daughter was about 68 yo and took until August  2020. Zoloft- Not effective. May have felt "out of it." Prozac- Helped some with mood and possibly Wellbutrin- Not effective Lithium Latuda- akathisia Rexulti Buspar- Felt warm and somewhat nauseated Hydroxyzine- Caused drowsiness and did not help with anxiety Trazodone Nortriptyline- Has been helpful for her mood Gabapentin- Felt disconnected with taking 300 mg BID and 600 mg QHS. Has been helpful for anxiety. Klonopin- Was taking 0.25 mg prn and needing to take more since move to [place] Klonopin ODT Xanax XR  [place] Office Visit from 07/21/2021 in [place] Visit from 05/20/2021 in [place] Total Score 0 0        Review of Systems:  Review of Systems  Genitourinary: Negative.   Musculoskeletal:  Negative for gait problem.  Neurological:  Negative for tremors.  Psychiatric/Behavioral:         Please refer to HPI   Medications: I have reviewed the patient's current medications.  Current Outpatient Medications  Medication Sig Dispense Refill   brexpiprazole (REXULTI) 2 MG TABS tablet Take 1 tablet (2 mg total) by mouth daily. (Patient taking differently: Take 1 mg by mouth every other day.) 90 tablet 0   clonazePAM (KLONOPIN) 1 MG tablet Take 1/2-1 tab po TID prn anxiety 90 tablet 2   FLUoxetine (PROZAC) 10 MG capsule Take 1 capsule daily for one week, then increase to 2 capsules daily 60 capsule 1   lithium carbonate 300 MG capsule Take 2 capsules (600 mg total) by mouth at bedtime. 180 capsule 0  propranolol ER (INDERAL LA) 60 MG 24 hr capsule Take 1 capsule (60 mg total) by mouth 2 (two) times daily. 180 capsule 0   escitalopram (LEXAPRO) 5 MG tablet Take 1.5 tablets daily for 5 days, then 1 tablet daily for 5 days, then 1/2 tablet daily for 5 days, then stop 30 tablet 0   SENNA PO Take by mouth as needed. (Patient not taking: Reported on 07/21/2021)     traZODone (DESYREL) 100 MG tablet Take 1/2-1 tablet po QHS  prn insomnia (Patient not taking: Reported on 07/21/2021) 90 tablet 1   VITAMIN D PO Take 5,000 Int'l Units/day by mouth. (Patient not taking: Reported on 08/17/2021)     No current facility-administered medications for this visit.    Medication Side Effects: None  Allergies:  Allergies  Allergen Reactions   Ultram [Tramadol] Rash    Past Medical History:  Diagnosis Date   Anxiety    Back pain    IBS (irritable bowel syndrome)    Social anxiety disorder     Past Medical History, Surgical history, Social history, and Family history were reviewed and updated as appropriate.   Please see review of systems for further details on the patient's review from today.   Objective:   Physical Exam:  There were no vitals taken for this visit.  Physical Exam Constitutional:      General: She is not in acute distress. Musculoskeletal:        General: No deformity.  Neurological:     Mental Status: She is alert and oriented to person, place, and time.     Coordination: Coordination normal.  Psychiatric:        Attention and Perception: Attention and perception normal. She does not perceive auditory or visual hallucinations.        Mood and Affect: Mood is anxious and depressed. Affect is not labile, blunt, angry or inappropriate.        Speech: Speech normal.        Behavior: Behavior normal.        Thought Content: Thought content normal. Thought content is not paranoid or delusional. Thought content does not include homicidal or suicidal ideation. Thought content does not include homicidal or suicidal plan.        Cognition and Memory: Cognition and memory normal.        Judgment: Judgment normal.     Comments: Insight intact    Lab Review:     Component Value Date/Time   NA 136 01/12/2021 1045   K 4.2 01/12/2021 1045   CL 102 01/12/2021 1045   CO2 27 01/12/2021 1045   GLUCOSE 95 01/12/2021 1045   BUN 13 01/12/2021 1045   CREATININE 1.25 (H) 01/12/2021 1045   CALCIUM 9.2  01/12/2021 1045   PROT 6.8 02/26/2007 1243   ALBUMIN 4.2 02/26/2007 1243   AST 16 02/26/2007 1243   ALT 9 02/26/2007 1243   ALKPHOS 33 (L) 02/26/2007 1243   BILITOT 0.9 02/26/2007 1243   GFRNONAA >60 06/30/2010 1151   GFRAA  06/30/2010 1151    >60        The eGFR has been calculated using the MDRD equation. This calculation has not been validated in all clinical situations. eGFR's persistently <60 mL/min signify possible Chronic Kidney Disease.       Component Value Date/Time   WBC 9.1 04/05/2010 1517   RBC 4.47 04/05/2010 1517   HGB 13.9 06/30/2010 1151   HCT 39.2 06/30/2010 1151   PLT  203 04/05/2010 1517   MCV 91.8 04/05/2010 1517   MCH 31.6 04/05/2010 1517   MCHC 34.5 04/05/2010 1517   RDW 13.4 04/05/2010 1517   LYMPHSABS 2.3 04/05/2010 1517   MONOABS 0.6 04/05/2010 1517   EOSABS 0.2 04/05/2010 1517   BASOSABS 0.0 04/05/2010 1517    Lithium Lvl  Date Value Ref Range Status  01/12/2021 1.2 0.6 - 1.2 mmol/L Final     No results found for: PHENYTOIN, PHENOBARB, VALPROATE, CBMZ   .res Assessment: Plan:    Pt seen for 30 minutes and time spent discussing response to Lexapro and possible alternatives to Lexapro since she reports that Lexapro does not seem to be effective. Discussed potential benefits, risks, and side effects of Prozac which she reports that she tolerated in the past without difficulty. Pt agrees to trial of Prozac. Will start Prozac 10 mg po qd for one week, then increase to 20 mg po qd for anxiety and depression. Will decrease Lexapro to 7.5 mg po qd for 5 days, then 5 mg po qd for 5 days, then 2.5 mg for 5 days, then stop. Discussed slower titration due to h/o severe discontinuation s/s in the past.  Continue Klonopin 1 mg 1/2-1 tab po TID prn anxiety.  Continue Lithium 600 mg po QHS for mood s/s.  Continue Rexulti 1 mg po q other day for mood s/s. Continue Propranolol ER 60 mg po BID for anxiety.  Pt to follow-up in 6 weeks or sooner if clinically  indicated.  Patient advised to contact office with any questions, adverse effects, or acute worsening in signs and symptoms.   Maneh was seen today for depression and anxiety.  Diagnoses and all orders for this visit:  Social anxiety disorder -     FLUoxetine (PROZAC) 10 MG capsule; Take 1 capsule daily for one week, then increase to 2 capsules daily  Panic disorder with agoraphobia -     escitalopram (LEXAPRO) 5 MG tablet; Take 1.5 tablets daily for 5 days, then 1 tablet daily for 5 days, then 1/2 tablet daily for 5 days, then stop -     FLUoxetine (PROZAC) 10 MG capsule; Take 1 capsule daily for one week, then increase to 2 capsules daily  Depression, unspecified depression type -     escitalopram (LEXAPRO) 5 MG tablet; Take 1.5 tablets daily for 5 days, then 1 tablet daily for 5 days, then 1/2 tablet daily for 5 days, then stop -     FLUoxetine (PROZAC) 10 MG capsule; Take 1 capsule daily for one week, then increase to 2 capsules daily     Please see After Visit Summary for patient specific instructions.  Future Appointments  Date Time Provider [place]  10/03/2021  9:00 AM Thayer Headings, PMHNP [facility] None    No orders of the defined types were placed in this encounter.   -------------------------------

## 2021-09-13 ENCOUNTER — Other Ambulatory Visit: Payer: Self-pay

## 2021-09-13 DIAGNOSIS — F32A Depression, unspecified: Secondary | ICD-10-CM

## 2021-09-13 DIAGNOSIS — F39 Unspecified mood [affective] disorder: Secondary | ICD-10-CM

## 2021-09-13 MED ORDER — LITHIUM CARBONATE 300 MG PO CAPS: 600.0000 mg | ORAL_CAPSULE | Freq: Every day | ORAL | 0 refills | Status: DC

## 2021-09-13 NOTE — Telephone Encounter (Signed)
Clarify dose of Rexulti 1 mg or 2 mg ?

## 2021-09-28 MED ORDER — BREXPIPRAZOLE 2 MG PO TABS: 2.0000 mg | ORAL_TABLET | Freq: Every day | ORAL | 0 refills | Status: DC

## 2021-10-03 ENCOUNTER — Encounter: Payer: Self-pay | Admitting: Psychiatry

## 2021-10-03 ENCOUNTER — Ambulatory Visit (INDEPENDENT_AMBULATORY_CARE_PROVIDER_SITE_OTHER): Payer: 59 | Admitting: Psychiatry

## 2021-10-03 DIAGNOSIS — F4010 Social phobia, unspecified: Secondary | ICD-10-CM

## 2021-10-03 DIAGNOSIS — F32A Depression, unspecified: Secondary | ICD-10-CM

## 2021-10-03 DIAGNOSIS — F4001 Agoraphobia with panic disorder: Secondary | ICD-10-CM

## 2021-10-03 MED ORDER — PROPRANOLOL HCL ER 60 MG PO CP24: 60.0000 mg | ORAL_CAPSULE | Freq: Two times a day (BID) | ORAL | 0 refills | Status: DC

## 2021-10-03 MED ORDER — FLUOXETINE HCL 40 MG PO CAPS: 40.0000 mg | ORAL_CAPSULE | Freq: Every day | ORAL | 0 refills | Status: DC

## 2021-10-03 NOTE — Progress Notes (Signed)
Cash A Olshefski ?[id number] ?01-01-1981 ?41 y.o. ? ?Subjective:  ? ?Patient ID:  Amanda Rogers is a 41 y.o. (DOB [birth year]) female. ? ?Chief Complaint:  ?Chief Complaint  ?Patient presents with  ? Anxiety  ? Depression  ? ? ?Anxiety ? ? ? ?Depression ?       Past medical history includes anxiety.   ?Amanda Rogers presents to the office today for follow-up of anxiety, depression, insomnia.  ?She reports that her depression has "been better... overall I feel a lot better than the last time I was here." She reports that she has not seen a major change in anxiety s/s. She reports that she awakens in the mornings with anxiety. She is no longer waking up at 3 am with panic attacks and is now sleeping through the night. Denies any recent panic attacks. "I start to feel some panic, but not to the point of getting to full blown panic. She reports that she was able to host family gathering and withdrew some and may have dissociated. Recently attended a family holiday gathering at the home where her parents lived previously before selling to another family member. Had anxiety when daughter had friends over and is unsure if this is related to PTSD. She reports that worry has been "ok." Denies any manic s/s or impulsivity. Energy and motivation have been good. She reports that her concentration has been good and she has been able to read again. Appetite has been good. She reports some weight gain. Denies SI.  ? ?They installed a fence themselves and this "was a lot of work... but it felt good." They have also planted flowers. She noticed her mood was a little lower a couple of days after the projects were completed.  ? ?She and her brother are similar in some ways and get along. Husband has a [employer] channel and works on this during the week.  ? ?Past Psychiatric Medication Trials: ?Lexapro- Initially seemed to be helpful and took prior to pregnancy with daughter and re-started it when daughter was about 38 yo and took until  August 2020. ?Zoloft- Not effective. May have felt "out of it." ?Prozac- Helped some with mood and possibly ?Wellbutrin- Not effective ?Lithium ?Latuda- akathisia ?Rexulti- Felt mentally restless on higher doses.  ?Buspar- Felt warm and somewhat nauseated ?Hydroxyzine- Caused drowsiness and did not help with anxiety ?Trazodone ?Nortriptyline- Has been helpful for her mood ?Gabapentin- Felt disconnected with taking 300 mg BID and 600 mg QHS. Has been helpful for anxiety. ?Klonopin- Was taking 0.25 mg prn and needing to take more since move to  ?Klonopin ODT ?Xanax XR ?  ? ?AIMS   ? ?[facility] Office Visit from 10/03/2021 in [place] Visit from 07/21/2021 in [place] Visit from 05/20/2021 in [facility]  ?AIMS Total Score 0 0 0  ? ?  ?  ? ?Review of Systems:  ?Review of Systems  ?Genitourinary: Negative.   ?Musculoskeletal:  Negative for gait problem.  ?Neurological:  Negative for tremors.  ?Psychiatric/Behavioral:  Positive for depression.   ?     Please refer to HPI  ? ?Medications: I have reviewed the patient's current medications. ? ?Current Outpatient Medications  ?Medication Sig Dispense Refill  ? brexpiprazole (REXULTI) 2 MG TABS tablet Take 1 tablet (2 mg total) by mouth daily. (Patient taking differently: Take 1 mg by mouth daily. Every other day) 90 tablet 0  ? clonazePAM (KLONOPIN) 1 MG tablet Take 1/2-1 tab  po TID prn anxiety 90 tablet 2  ? lithium carbonate 300 MG capsule Take 2 capsules (600 mg total) by mouth at bedtime. 180 capsule 0  ? FLUoxetine (PROZAC) 40 MG capsule Take 1 capsule (40 mg total) by mouth daily. 90 capsule 0  ? propranolol ER (INDERAL LA) 60 MG 24 hr capsule Take 1 capsule (60 mg total) by mouth 2 (two) times daily. 180 capsule 0  ? SENNA PO Take by mouth as needed. (Patient not taking: Reported on 07/21/2021)    ? traZODone (DESYREL) 100 MG tablet Take 1/2-1 tablet po QHS prn insomnia (Patient not taking:  Reported on 07/21/2021) 90 tablet 1  ? VITAMIN D PO Take 5,000 Int'l Units/day by mouth. (Patient not taking: Reported on 08/17/2021)    ? ?No current facility-administered medications for this visit.  ? ? ?Medication Side Effects: None ? ?Allergies:  ?Allergies  ?Allergen Reactions  ? Ultram [Tramadol] Rash  ? ? ?Past Medical History:  ?Diagnosis Date  ? Anxiety   ? Back pain   ? IBS (irritable bowel syndrome)   ? Social anxiety disorder   ? ? ?Past Medical History, Surgical history, Social history, and Family history were reviewed and updated as appropriate.  ? ?Please see review of systems for further details on the patient's review from today.  ? ?Objective:  ? ?Physical Exam:  ?BP 104/70   Pulse 62  ? ?Physical Exam ?Constitutional:   ?   General: She is not in acute distress. ?Musculoskeletal:     ?   General: No deformity.  ?Neurological:  ?   Mental Status: She is alert and oriented to person, place, and time.  ?   Coordination: Coordination normal.  ?Psychiatric:     ?   Attention and Perception: Attention and perception normal. She does not perceive auditory or visual hallucinations.     ?   Mood and Affect: Mood is anxious. Affect is not labile, blunt, angry or inappropriate.     ?   Speech: Speech normal.     ?   Behavior: Behavior normal.     ?   Thought Content: Thought content normal. Thought content is not paranoid or delusional. Thought content does not include homicidal or suicidal ideation. Thought content does not include homicidal or suicidal plan.     ?   Cognition and Memory: Cognition and memory normal.     ?   Judgment: Judgment normal.  ?   Comments: Insight intact ?Mood is less depressed  ? ? ?Lab Review:  ?   ?Component Value Date/Time  ? NA 136 01/12/2021 1045  ? K 4.2 01/12/2021 1045  ? CL 102 01/12/2021 1045  ? CO2 27 01/12/2021 1045  ? GLUCOSE 95 01/12/2021 1045  ? BUN 13 01/12/2021 1045  ? CREATININE 1.25 (H) 01/12/2021 1045  ? CALCIUM 9.2 01/12/2021 1045  ? PROT 6.8 02/26/2007 1243  ?  ALBUMIN 4.2 02/26/2007 1243  ? AST 16 02/26/2007 1243  ? ALT 9 02/26/2007 1243  ? ALKPHOS 33 (L) 02/26/2007 1243  ? BILITOT 0.9 02/26/2007 1243  ? GFRNONAA >60 06/30/2010 1151  ? GFRAA  06/30/2010 1151  ?  >60        ?The eGFR has been calculated ?using the MDRD equation. ?This calculation has not been ?validated in all clinical ?situations. ?eGFR's persistently ?<60 mL/min signify ?possible Chronic Kidney Disease.  ? ? ?   ?Component Value Date/Time  ? WBC 9.1 04/05/2010 1517  ? RBC 4.47 04/05/2010  1517  ? HGB 13.9 06/30/2010 1151  ? HCT 39.2 06/30/2010 1151  ? PLT 203 04/05/2010 1517  ? MCV 91.8 04/05/2010 1517  ? MCH 31.6 04/05/2010 1517  ? MCHC 34.5 04/05/2010 1517  ? RDW 13.4 04/05/2010 1517  ? LYMPHSABS 2.3 04/05/2010 1517  ? MONOABS 0.6 04/05/2010 1517  ? EOSABS 0.2 04/05/2010 1517  ? BASOSABS 0.0 04/05/2010 1517  ? ? ?Lithium Lvl  ?Date Value Ref Range Status  ?01/12/2021 1.2 0.6 - 1.2 mmol/L Final  ?  ? ?No results found for: PHENYTOIN, PHENOBARB, VALPROATE, CBMZ  ? ?.res ?Assessment: Plan:   ?Pt seen for 30 minutes and time spent discussing response to Prozac. She asks about increasing Prozac since she has noticed a partial response in depressive s/s at 20 mg dose. Will increase Prozac to 40 mg po qd to further improve mood and possibly anxiety.  ?Continue Rexulti 2 mg 1/2 tablet every other day for mood s/s. Pt reports that she continues to have an adequate supply of 2 mg tabs and prefers to use current supply. Discussed that script could be sent for 0.5 mg tabs daily or 1 mg every other day in the future when she has completed current supply.  ?Continue Klonopin 1 mg 1/2-1 tab po TID prn anxiety.  ?Continue Lithium 600 mg at bedtime for mood stabilization.  ?Continue Propranolol ER 60 mg po BID for anxiety.  ?She reports that she has not needed Trazodone prn recently and would like to have it available if she experiences a recurrence of insomnia.  ?Pt to follow-up in 2 months or sooner if clinically  indicated.  ?Patient advised to contact office with any questions, adverse effects, or acute worsening in signs and symptoms. ? ? ?Jakita was seen today for anxiety and depression. ? ?Diagnoses and all orders fo

## 2021-11-23 ENCOUNTER — Other Ambulatory Visit: Payer: Self-pay

## 2021-11-23 DIAGNOSIS — G4700 Insomnia, unspecified: Secondary | ICD-10-CM

## 2021-11-28 ENCOUNTER — Encounter: Payer: Self-pay | Admitting: Psychiatry

## 2021-11-28 ENCOUNTER — Ambulatory Visit: Payer: 59 | Admitting: Psychiatry

## 2021-11-28 DIAGNOSIS — F32A Depression, unspecified: Secondary | ICD-10-CM

## 2021-11-28 DIAGNOSIS — F4010 Social phobia, unspecified: Secondary | ICD-10-CM | POA: Diagnosis not present

## 2021-11-28 DIAGNOSIS — F4001 Agoraphobia with panic disorder: Secondary | ICD-10-CM

## 2021-11-28 MED ORDER — CLONAZEPAM 1 MG PO TABS: ORAL_TABLET | ORAL | 5 refills | Status: DC

## 2021-11-28 MED ORDER — LITHIUM CARBONATE 300 MG PO CAPS: 600.0000 mg | ORAL_CAPSULE | Freq: Every day | ORAL | 0 refills | Status: DC

## 2021-11-28 MED ORDER — FLUOXETINE HCL 40 MG PO CAPS: 40.0000 mg | ORAL_CAPSULE | Freq: Every day | ORAL | 0 refills | Status: DC

## 2021-11-28 MED ORDER — PROPRANOLOL HCL ER 60 MG PO CP24: 60.0000 mg | ORAL_CAPSULE | Freq: Two times a day (BID) | ORAL | 0 refills | Status: DC

## 2021-11-28 NOTE — Progress Notes (Signed)
[name] [id number] Jun 02, 1981 41 y.o.  Subjective:   Patient ID:  Amanda Rogers is a 41 y.o. (DOB [birth year]) female.  Chief Complaint:  Chief Complaint  Patient presents with   Follow-up    Mood disturbance, anxiety    HPI Amanda Rogers presents to the office today for follow-up of anxiety, depression, and insomnia. She reports, "I've been doing ok." She reports that she does not feel "as anxious I have months prior" since increase in Prozac. Notices less over-thinking. She has had some occasional panic symptoms. She reports that social anxiety has "been ok." She has been able to attend some hockey games and "did not feel as overwhelmed as she did before." She reports that she tends to stay to herself in a social situation.  She reports that she had some depression in May which "is too be expected" since it was anniversary of father's death 2022-11-27) that fell on Mother's day and his birthday ([birth year]). She reports that her depression improved after these anniversaries. Denies irritability. Denies impulsivity or risky behaviors. She reports that her sleep was somewhat erratic for a period of time when she was having flank pain with UTI and kidney stone. Sleep has improved and went to bed at 10 pm last night. Energy and motivation were lower with UTI and energy and motivation have been ok and has been able to get things done. Appetite has been changed with physical issues and now is eating about once a day. Concentration has been good. Denies SI.   Nephew and niece just graduated high school. Daughter is now out of school. Daughter's orchestra performed at [employer] and will be going to nationals next year in [place]. Daughter will be starting 8th grade next year. Daughter is doing well.   Past Psychiatric Medication Trials: Lexapro- Initially seemed to be helpful and took prior to pregnancy with daughter and re-started it when daughter was about 44 yo and took until August 2020. Zoloft-  Not effective. May have felt "out of it." Prozac- Helped some with mood and possibly Wellbutrin- Not effective Lithium Latuda- akathisia Rexulti- Felt mentally restless on higher doses.  Buspar- Felt warm and somewhat nauseated Hydroxyzine- Caused drowsiness and did not help with anxiety Trazodone Nortriptyline- Has been helpful for her mood Gabapentin- Felt disconnected with taking 300 mg BID and 600 mg QHS. Has been helpful for anxiety. Klonopin- Was taking 0.25 mg prn and needing to take more since move to  Klonopin ODT Xanax XR  [facility] Office Visit from 11/28/2021 in [place] [facility] Visit from 10/03/2021 in [facility] Visit from 07/21/2021 in [facility] Visit from 05/20/2021 in [facility] Total Score 0 0 0 0        Review of Systems:  Review of Systems  Gastrointestinal:        Had constipation and was prescribed Miralax. Then had diarrhea afterwards. Has apt with GI.   Genitourinary:        Recent UTI and was told she has another kidney stone. Scheduled for lithotripsy.  Musculoskeletal:  Negative for gait problem.  Psychiatric/Behavioral:         Please refer to HPI    Medications: I have reviewed the patient's current medications.  Current Outpatient Medications  Medication Sig Dispense Refill   brexpiprazole (REXULTI) 2 MG TABS tablet Take 1 tablet (2 mg total) by mouth daily. (Patient taking differently: Take 1 mg by mouth  daily. Every other day) 90 tablet 0   polyethylene glycol (MIRALAX / GLYCOLAX) 17 g packet Take 17 g by mouth daily as needed.     [START ON 12/10/2021] clonazePAM (KLONOPIN) 1 MG tablet Take 1/2-1 tab po TID prn anxiety 90 tablet 5   FLUoxetine (PROZAC) 40 MG capsule Take 1 capsule (40 mg total) by mouth daily. 90 capsule 0   lithium carbonate 300 MG capsule Take 2 capsules (600 mg total) by mouth at bedtime. 180 capsule 0   propranolol ER  (INDERAL LA) 60 MG 24 hr capsule Take 1 capsule (60 mg total) by mouth 2 (two) times daily. 180 capsule 0   traZODone (DESYREL) 100 MG tablet Take 1/2-1 tablet po QHS prn insomnia (Patient not taking: Reported on 07/21/2021) 90 tablet 1   VITAMIN D PO Take 5,000 Int'l Units/day by mouth. (Patient not taking: Reported on 08/17/2021)     No current facility-administered medications for this visit.    Medication Side Effects: None  Allergies:  Allergies  Allergen Reactions   Ultram [Tramadol] Rash    Past Medical History:  Diagnosis Date   Anxiety    Back pain    IBS (irritable bowel syndrome)    Social anxiety disorder     Past Medical History, Surgical history, Social history, and Family history were reviewed and updated as appropriate.   Please see review of systems for further details on the patient's review from today.   Objective:   Physical Exam:  BP 109/73   Pulse 72   Physical Exam Constitutional:      General: She is not in acute distress. Musculoskeletal:        General: No deformity.  Neurological:     Mental Status: She is alert and oriented to person, place, and time.     Coordination: Coordination normal.  Psychiatric:        Attention and Perception: Attention and perception normal. She does not perceive auditory or visual hallucinations.        Mood and Affect: Mood normal. Mood is not anxious or depressed. Affect is not labile, blunt, angry or inappropriate.        Speech: Speech normal.        Behavior: Behavior normal.        Thought Content: Thought content normal. Thought content is not paranoid or delusional. Thought content does not include homicidal or suicidal ideation. Thought content does not include homicidal or suicidal plan.        Cognition and Memory: Cognition and memory normal.        Judgment: Judgment normal.     Comments: Insight intact     Lab Review:     Component Value Date/Time   NA 136 01/12/2021 1045   K 4.2 01/12/2021 1045    CL 102 01/12/2021 1045   CO2 27 01/12/2021 1045   GLUCOSE 95 01/12/2021 1045   BUN 13 01/12/2021 1045   CREATININE 1.25 (H) 01/12/2021 1045   CALCIUM 9.2 01/12/2021 1045   PROT 6.8 02/26/2007 1243   ALBUMIN 4.2 02/26/2007 1243   AST 16 02/26/2007 1243   ALT 9 02/26/2007 1243   ALKPHOS 33 (L) 02/26/2007 1243   BILITOT 0.9 02/26/2007 1243   GFRNONAA >60 06/30/2010 1151   GFRAA  06/30/2010 1151    >60        The eGFR has been calculated using the MDRD equation. This calculation has not been validated in all clinical situations. eGFR's persistently <60 mL/min  signify possible Chronic Kidney Disease.       Component Value Date/Time   WBC 9.1 04/05/2010 1517   RBC 4.47 04/05/2010 1517   HGB 13.9 06/30/2010 1151   HCT 39.2 06/30/2010 1151   PLT 203 04/05/2010 1517   MCV 91.8 04/05/2010 1517   MCH 31.6 04/05/2010 1517   MCHC 34.5 04/05/2010 1517   RDW 13.4 04/05/2010 1517   LYMPHSABS 2.3 04/05/2010 1517   MONOABS 0.6 04/05/2010 1517   EOSABS 0.2 04/05/2010 1517   BASOSABS 0.0 04/05/2010 1517    Lithium Lvl  Date Value Ref Range Status  01/12/2021 1.2 0.6 - 1.2 mmol/L Final     No results found for: "PHENYTOIN", "PHENOBARB", "VALPROATE", "CBMZ"   .res Assessment: Plan:   Pt seen for 30 minutes and time spent reviewing treatment plan. She reports that overall her mood and anxiety symptoms have been better controlled. Will continue current plan of care since target signs and symptoms are well controlled without any tolerability issues. Continue Prozac 40 mg po qd for anxiety and depression.  Continue Rexulti 1 mg every other day for mood symptoms. Pt reports that she does not need a script at this time.  Continue Lithium 600 mg at bedtime for mood stabilization.  Continue Propranolol ER 60 mg twice daily for anxiety.  She reports that she has not needed Trazodone prn recently. Will keep on med list in case she may need it prn in the future.  Pt to follow-up in 3  months or sooner if clinically indicated.  Patient advised to contact office with any questions, adverse effects, or acute worsening in signs and symptoms.   Karrina was seen today for follow-up.  Diagnoses and all orders for this visit:  Panic disorder with agoraphobia -     clonazePAM (KLONOPIN) 1 MG tablet; Take 1/2-1 tab po TID prn anxiety -     FLUoxetine (PROZAC) 40 MG capsule; Take 1 capsule (40 mg total) by mouth daily. -     propranolol ER (INDERAL LA) 60 MG 24 hr capsule; Take 1 capsule (60 mg total) by mouth 2 (two) times daily.  Depression, unspecified depression type -     FLUoxetine (PROZAC) 40 MG capsule; Take 1 capsule (40 mg total) by mouth daily. -     lithium carbonate 300 MG capsule; Take 2 capsules (600 mg total) by mouth at bedtime.  Social anxiety disorder -     FLUoxetine (PROZAC) 40 MG capsule; Take 1 capsule (40 mg total) by mouth daily.     Please see After Visit Summary for patient specific instructions.  Future Appointments  Date Time Provider [place]  02/28/2022 10:00 AM Thayer Headings, PMHNP [facility] None     No orders of the defined types were placed in this encounter.   -------------------------------

## 2021-12-15 MED ORDER — TRAZODONE HCL 100 MG PO TABS: ORAL_TABLET | ORAL | 1 refills | Status: DC

## 2022-02-28 ENCOUNTER — Encounter: Payer: Self-pay | Admitting: Psychiatry

## 2022-02-28 ENCOUNTER — Ambulatory Visit: Payer: 59 | Admitting: Psychiatry

## 2022-02-28 DIAGNOSIS — F39 Unspecified mood [affective] disorder: Secondary | ICD-10-CM | POA: Diagnosis not present

## 2022-02-28 DIAGNOSIS — F4010 Social phobia, unspecified: Secondary | ICD-10-CM

## 2022-02-28 DIAGNOSIS — F4001 Agoraphobia with panic disorder: Secondary | ICD-10-CM | POA: Diagnosis not present

## 2022-02-28 MED ORDER — FLUOXETINE HCL 40 MG PO CAPS: 40.0000 mg | ORAL_CAPSULE | Freq: Every day | ORAL | 0 refills | Status: DC

## 2022-02-28 MED ORDER — BREXPIPRAZOLE 2 MG PO TABS: ORAL_TABLET | ORAL | 0 refills | Status: DC

## 2022-02-28 MED ORDER — LITHIUM CARBONATE 300 MG PO CAPS: 600.0000 mg | ORAL_CAPSULE | Freq: Every day | ORAL | 0 refills | Status: DC

## 2022-02-28 MED ORDER — PROPRANOLOL HCL ER 60 MG PO CP24: 60.0000 mg | ORAL_CAPSULE | Freq: Two times a day (BID) | ORAL | 0 refills | Status: DC
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
## 2022-04-25 ENCOUNTER — Telehealth: Payer: Self-pay

## 2022-04-25 DIAGNOSIS — F4001 Agoraphobia with panic disorder: Secondary | ICD-10-CM

## 2022-04-25 MED ORDER — PROPRANOLOL HCL ER 60 MG PO CP24: 60.0000 mg | ORAL_CAPSULE | Freq: Two times a day (BID) | ORAL | 0 refills | Status: DC

## 2022-05-30 ENCOUNTER — Ambulatory Visit: Payer: 59 | Admitting: Psychiatry

## 2022-05-30 ENCOUNTER — Encounter: Payer: Self-pay | Admitting: Psychiatry

## 2022-05-30 VITALS — BP 105/76 | HR 76 | Wt 218.0 lb

## 2022-05-30 DIAGNOSIS — F39 Unspecified mood [affective] disorder: Secondary | ICD-10-CM

## 2022-05-30 DIAGNOSIS — F4001 Agoraphobia with panic disorder: Secondary | ICD-10-CM

## 2022-05-30 DIAGNOSIS — F4010 Social phobia, unspecified: Secondary | ICD-10-CM

## 2022-05-30 DIAGNOSIS — G4700 Insomnia, unspecified: Secondary | ICD-10-CM | POA: Diagnosis not present

## 2022-05-30 DIAGNOSIS — Z79899 Other long term (current) drug therapy: Secondary | ICD-10-CM

## 2022-05-30 MED ORDER — CLONAZEPAM 1 MG PO TABS: ORAL_TABLET | ORAL | 5 refills | Status: DC

## 2022-05-30 MED ORDER — LITHIUM CARBONATE 300 MG PO CAPS: 600.0000 mg | ORAL_CAPSULE | Freq: Every day | ORAL | 1 refills | Status: DC

## 2022-05-30 MED ORDER — TRAZODONE HCL 100 MG PO TABS: ORAL_TABLET | ORAL | 1 refills | Status: DC

## 2022-05-30 MED ORDER — PROPRANOLOL HCL ER 60 MG PO CP24: 60.0000 mg | ORAL_CAPSULE | Freq: Two times a day (BID) | ORAL | 1 refills | Status: DC

## 2022-05-30 MED ORDER — BREXPIPRAZOLE 2 MG PO TABS: ORAL_TABLET | ORAL | 0 refills | Status: DC

## 2022-05-30 MED ORDER — FLUOXETINE HCL 40 MG PO CAPS: 40.0000 mg | ORAL_CAPSULE | Freq: Every day | ORAL | 0 refills | Status: DC

## 2022-05-30 NOTE — Progress Notes (Unsigned)
JAKE GOODSON [id number] 06/08/1981 41 y.o.  Subjective:   Patient ID:  Amanda Rogers is a 41 y.o. (DOB 24-Jun-1980) female.  Chief Complaint:  Chief Complaint  Patient presents with   Follow-up    Anxiety, depression, and insomnia    HPI Amanda Rogers presents to the office today for follow-up of anxiety, depression, and insomnia.   "Things have been ok, mentally." She had an endoscopy/colonoscopy and then had COVID and strep. She then had severe pneumonia. She reports that her shortness of breath just recently resolved. She reports that coughing and shortness of breath interfered with her sleep. She had vomiting every morning for about a month due to phlegm. She also had severe night sweats and fatigue. She has also had recurrent UTI's.   She reports that she had "kind of a rough time around Thanksgiving" in response to grief and processing loss of her father. She reports occ sad mood has been manageable- "I don't stay down." Denies any impulsivity, risky behavior, or irritability. Anxiety "has been ok" except around Thanksgiving when she was around family and felt like she was on the verge of panic. Appetite has been good. Concentration has been ok. Denies SI.   Her mother has moved again. Mother's place is smaller and she has been asked to host family Christmas gathering.   Klonopin last filled 05/07/22.   Past Psychiatric Medication Trials: Lexapro- Initially seemed to be helpful and took prior to pregnancy with daughter and re-started it when daughter was about 71 yo and took until August 2020. Zoloft- Not effective. May have felt "out of it." Prozac- Helped some with mood and possibly Wellbutrin- Not effective Lithium Latuda- akathisia Rexulti- Felt mentally restless on higher doses.  Buspar- Felt warm and somewhat nauseated Hydroxyzine- Caused drowsiness and did not help with anxiety Trazodone Nortriptyline- Has been helpful for her mood Gabapentin- Felt disconnected  with taking 300 mg BID and 600 mg QHS. Has been helpful for anxiety. Klonopin- Was taking 0.25 mg prn and needing to take more since move to [place] Klonopin ODT Xanax XR  [place] Office Visit from 05/30/2022 in [place] Visit from 02/28/2022 in [place] Visit from 11/28/2021 in [facility] Visit from 10/03/2021 in [facility] Visit from 07/21/2021 in [place] Total Score 0 0 0 0 0        Review of Systems:  Review of Systems  Gastrointestinal:        She reports hiatal hernia resolved.  Genitourinary:        Had some recent UTI s/s.   Musculoskeletal:  Negative for gait problem.  Neurological:  Negative for tremors.  Psychiatric/Behavioral:         Please refer to HPI    Medications: I have reviewed the patient's current medications.  Current Outpatient Medications  Medication Sig Dispense Refill   levothyroxine (SYNTHROID) 50 MCG tablet Take 50 mcg by mouth daily before breakfast.     metFORMIN (GLUCOPHAGE-XR) 500 MG 24 hr tablet Take 500 mg by mouth daily with breakfast.     brexpiprazole (REXULTI) 2 MG TABS tablet Take 1 tablet every 1-2 days 90 tablet 0   [START ON 06/04/2022] clonazePAM (KLONOPIN) 1 MG tablet Take 1/2-1 tab po TID prn anxiety 90 tablet 5   FLUoxetine (PROZAC) 40 MG capsule Take 1 capsule (40 mg total) by mouth daily. 90 capsule 0   LINZESS 145  MCG CAPS capsule Take 145 mcg by mouth daily.     lithium carbonate 300 MG capsule Take 2 capsules (600 mg total) by mouth at bedtime. 180 capsule 1   polyethylene glycol (MIRALAX / GLYCOLAX) 17 g packet Take 17 g by mouth daily as needed.     propranolol ER (INDERAL LA) 60 MG 24 hr capsule Take 1 capsule (60 mg total) by mouth 2 (two) times daily. 180 capsule 1   traZODone (DESYREL) 100 MG tablet Take 1/2-1 tablet po QHS prn insomnia 90 tablet 1   VITAMIN D PO Take 5,000 Int'l  Units/day by mouth. (Patient not taking: Reported on 08/17/2021)     No current facility-administered medications for this visit.    Medication Side Effects: None  Allergies:  Allergies  Allergen Reactions   Ultram [Tramadol] Rash    Past Medical History:  Diagnosis Date   Anxiety    Back pain    IBS (irritable bowel syndrome)    Social anxiety disorder     Past Medical History, Surgical history, Social history, and Family history were reviewed and updated as appropriate.   Please see review of systems for further details on the patient's review from today.   Objective:   Physical Exam:  BP 105/76   Pulse 76   Wt 218 lb (98.9 kg)   BMI 36.28 kg/m   Physical Exam Constitutional:      General: She is not in acute distress. Musculoskeletal:        General: No deformity.  Neurological:     Mental Status: She is alert and oriented to person, place, and time.     Coordination: Coordination normal.  Psychiatric:        Attention and Perception: Attention and perception normal. She does not perceive auditory or visual hallucinations.        Mood and Affect: Mood normal. Mood is not anxious or depressed. Affect is not labile, blunt, angry or inappropriate.        Speech: Speech normal.        Behavior: Behavior normal.        Thought Content: Thought content normal. Thought content is not paranoid or delusional. Thought content does not include homicidal or suicidal ideation. Thought content does not include homicidal or suicidal plan.        Cognition and Memory: Cognition and memory normal.        Judgment: Judgment normal.     Comments: Insight intact     Lab Review:     Component Value Date/Time   NA 136 01/12/2021 1045   K 4.2 01/12/2021 1045   CL 102 01/12/2021 1045   CO2 27 01/12/2021 1045   GLUCOSE 95 01/12/2021 1045   BUN 13 01/12/2021 1045   CREATININE 1.25 (H) 01/12/2021 1045   CALCIUM 9.2 01/12/2021 1045   PROT 6.8 02/26/2007 1243   ALBUMIN 4.2  02/26/2007 1243   AST 16 02/26/2007 1243   ALT 9 02/26/2007 1243   ALKPHOS 33 (L) 02/26/2007 1243   BILITOT 0.9 02/26/2007 1243   GFRNONAA >60 06/30/2010 1151   GFRAA  06/30/2010 1151    >60        The eGFR has been calculated using the MDRD equation. This calculation has not been validated in all clinical situations. eGFR's persistently <60 mL/min signify possible Chronic Kidney Disease.       Component Value Date/Time   WBC 9.1 04/05/2010 1517   RBC 4.47 04/05/2010 1517   HGB  13.9 06/30/2010 1151   HCT 39.2 06/30/2010 1151   PLT 203 04/05/2010 1517   MCV 91.8 04/05/2010 1517   MCH 31.6 04/05/2010 1517   MCHC 34.5 04/05/2010 1517   RDW 13.4 04/05/2010 1517   LYMPHSABS 2.3 04/05/2010 1517   MONOABS 0.6 04/05/2010 1517   EOSABS 0.2 04/05/2010 1517   BASOSABS 0.0 04/05/2010 1517    Lithium Lvl  Date Value Ref Range Status  01/12/2021 1.2 0.6 - 1.2 mmol/L Final     No results found for: "PHENYTOIN", "PHENOBARB", "VALPROATE", "CBMZ"   .res Assessment: Plan:    Will continue current plan of care since target signs and symptoms are well controlled without any tolerability issues. Will order a lithium level and BMP to evaluate for possible adverse effects and to determine blood level.  Will continue Rexulti 2 mg every 1 to 2 days for mood signs and symptoms. Will continue Klonopin 1 mg 3 times daily for anxiety. Continue Prozac 40 mg daily for anxiety and depression. Continue lithium 600 mg at bedtime for mood stabilization. Continue propranolol ER 60 mg twice daily for anxiety. Continue trazodone as needed for insomnia. Patient advised to contact office with any questions, adverse effects, or acute worsening in signs and symptoms. Pt to follow-up in 3 months or sooner if clinically indicated.  Riven was seen today for follow-up.  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 -     lithium carbonate 300 MG capsule; Take 2 capsules (600 mg total) by mouth at bedtime.  Insomnia, unspecified type -     traZODone (DESYREL) 100 MG tablet; Take 1/2-1 tablet po QHS prn insomnia  Panic disorder with agoraphobia -     propranolol ER (INDERAL LA) 60 MG 24 hr capsule; Take 1 capsule (60 mg total) by mouth 2 (two) times daily. -     clonazePAM (KLONOPIN) 1 MG tablet; Take 1/2-1 tab po TID prn anxiety -     FLUoxetine (PROZAC) 40 MG capsule; Take 1 capsule (40 mg total) by mouth daily.  Social anxiety disorder -     FLUoxetine (PROZAC) 40 MG capsule; Take 1 capsule (40 mg total) by mouth daily.  High risk medication use -     Lithium level -     Basic metabolic panel     Please see After Visit Summary for patient specific instructions.  Future Appointments  Date Time Provider [place]  08/29/2022 10:00 AM Thayer Headings, PMHNP CP-CP None    Orders Placed This Encounter  Procedures   Lithium level   Basic metabolic panel    -------------------------------

## 2022-08-29 ENCOUNTER — Ambulatory Visit: Payer: 59 | Admitting: Psychiatry

## 2022-09-03 ENCOUNTER — Telehealth: Payer: Self-pay

## 2022-09-04 NOTE — Telephone Encounter (Signed)
Lvm for patient to call back and schedule °

## 2022-10-03 ENCOUNTER — Telehealth: Payer: Self-pay | Admitting: Psychiatry

## 2022-10-03 NOTE — Telephone Encounter (Signed)
[facility] [place] has in stock. Insurance requires WG, but they don't have in stock. Using GoodRx #90 tablets is about $21.00. Patient said it was ok [facility] to [facility].

## 2022-10-03 NOTE — Telephone Encounter (Signed)
Amanda Rogers called at 4:40 to report that she can find the clonazepam.  Pharmacist told her to switch to something else that it could be at least a month before it would be available.  Please call to discuss options.

## 2022-10-04 ENCOUNTER — Other Ambulatory Visit: Payer: Self-pay

## 2022-10-04 DIAGNOSIS — F4001 Agoraphobia with panic disorder: Secondary | ICD-10-CM

## 2022-10-04 MED ORDER — CLONAZEPAM 1 MG PO TABS: ORAL_TABLET | ORAL | 0 refills | Status: DC

## 2022-10-04 NOTE — Telephone Encounter (Signed)
Pended.

## 2022-10-06 ENCOUNTER — Ambulatory Visit: Payer: 59 | Admitting: Psychiatry

## 2022-10-09 ENCOUNTER — Ambulatory Visit: Payer: 59 | Admitting: Psychiatry

## 2022-10-09 ENCOUNTER — Encounter: Payer: Self-pay | Admitting: Psychiatry

## 2022-10-09 VITALS — BP 121/84 | HR 75 | Wt 240.0 lb

## 2022-10-09 DIAGNOSIS — F39 Unspecified mood [affective] disorder: Secondary | ICD-10-CM

## 2022-10-09 DIAGNOSIS — F4010 Social phobia, unspecified: Secondary | ICD-10-CM | POA: Diagnosis not present

## 2022-10-09 DIAGNOSIS — F4001 Agoraphobia with panic disorder: Secondary | ICD-10-CM

## 2022-10-09 DIAGNOSIS — Z79899 Other long term (current) drug therapy: Secondary | ICD-10-CM

## 2022-10-09 MED ORDER — CLONAZEPAM 1 MG PO TABS: ORAL_TABLET | ORAL | 2 refills | Status: DC

## 2022-10-09 MED ORDER — BREXPIPRAZOLE 2 MG PO TABS: ORAL_TABLET | ORAL | 0 refills | Status: DC

## 2022-10-09 MED ORDER — FLUOXETINE HCL 40 MG PO CAPS: 40.0000 mg | ORAL_CAPSULE | Freq: Every day | ORAL | 0 refills | Status: DC

## 2022-10-09 MED ORDER — AUVELITY 45-105 MG PO TBCR: EXTENDED_RELEASE_TABLET | ORAL | 0 refills | Status: DC

## 2022-10-09 NOTE — Progress Notes (Signed)
Amanda Rogers [id number] [birth year] 42 y.o.  Subjective:   Patient ID:  Amanda Rogers is a 42 y.o. (DOB 27-Aug-1980) female.  Chief Complaint: No chief complaint on file.   HPI [name] presents to the office today for follow-up of ***  She reports, "my depression started getting worse" about 1.5 months ago.   Concentration and focus have been low. She reports that her energy and motivation have been very low. She reports that hygiene has been difficult and has to push herself to shower twice a week. She reports that many days she will drop her daughter off at school and then comes home and lies down. She reports that she has noticed some increase in anxiety and starting to want her family to accompany her to places again. Some anxiety with leaving home. Denies panic attacks. Sleeping excessively and taking naps. Diminished interest in hobbies. Denies any recent impulsivity. She reports some irritability and feeling like she is on the verge of tears. Denies SI.   She reports weight gain. Denies change in eating habits. Denies binge eating.   Her craft room was moved upstairs and it took a few months to get her things put away and organized- "It was just overwhelming."  Next month is 2 year anniversary since father's passing.   Past Psychiatric Medication Trials: Lexapro- Initially seemed to be helpful and took prior to pregnancy with daughter and re-started it when daughter was about 21 yo and took until August 2020. Zoloft- Not effective. May have felt "out of it." Prozac- Helped some with mood and possibly anxiety Wellbutrin- Not effective Lithium Latuda- akathisia Rexulti- Felt mentally restless on higher doses.  Buspar- Felt warm and somewhat nauseated Hydroxyzine- Caused drowsiness and did not help with anxiety Trazodone Nortriptyline- Has been helpful for her mood Gabapentin- Felt disconnected with taking 300 mg BID and 600 mg QHS. Has been helpful for  anxiety. Klonopin- Was taking 0.25 mg prn and needing to take more since move to [place] Klonopin ODT Xanax XR    AIMS    Flowsheet Row Office Visit from 10/09/2022 in [facility] Office Visit from 05/30/2022 in [facility] Office Visit from 02/28/2022 in [facility] Office Visit from 11/28/2021 in [facility] Office Visit from 10/03/2021 in [facility]  AIMS Total Score 0 0 0 0 0        Review of Systems:  Review of Systems  Constitutional:  Positive for fatigue and unexpected weight change.  Musculoskeletal:  Negative for gait problem.  Neurological:  Negative for tremors.  Psychiatric/Behavioral:         Please refer to HPI    Medications: {medication reviewed/display:3041432}  Current Outpatient Medications  Medication Sig Dispense Refill   brexpiprazole (REXULTI) 2 MG TABS tablet Take 1 tablet every 1-2 days (Patient taking differently: Take 1 mg by mouth every other day. Take 1 tablet every 1-2 days) 90 tablet 0   clonazePAM (KLONOPIN) 1 MG tablet Take 1/2-1 tab po TID prn anxiety 90 tablet 0   levothyroxine (SYNTHROID) 50 MCG tablet Take 50 mcg by mouth daily before breakfast.     LINZESS 145 MCG CAPS capsule Take 145 mcg by mouth daily as needed.     metFORMIN (GLUCOPHAGE-XR) 500 MG 24 hr tablet Take 500 mg by mouth daily with breakfast.     traZODone (DESYREL) 100 MG tablet Take 1/2-1 tablet po QHS prn insomnia  90 tablet 1   FLUoxetine (PROZAC) 40 MG capsule Take 1 capsule (40 mg total) by mouth daily. 90 capsule 0   lithium carbonate 300 MG capsule Take 2 capsules (600 mg total) by mouth at bedtime. 180 capsule 1   polyethylene glycol (MIRALAX / GLYCOLAX) 17 g packet Take 17 g by mouth daily as needed. (Patient not taking: Reported on 10/09/2022)     propranolol ER (INDERAL LA) 60 MG 24 hr capsule Take 1 capsule (60 mg total) by mouth 2  (two) times daily. 180 capsule 1   VITAMIN D PO Take 5,000 Int'l Units/day by mouth. (Patient not taking: Reported on 08/17/2021)     No current facility-administered medications for this visit.    Medication Side Effects: {Medication Side Effects (Optional):21014029}  Allergies:  Allergies  Allergen Reactions   Ultram [Tramadol] Rash    Past Medical History:  Diagnosis Date   Anxiety    Back pain    IBS (irritable bowel syndrome)    Social anxiety disorder     Past Medical History, Surgical history, Social history, and Family history were reviewed and updated as appropriate.   Please see review of systems for further details on the patient's review from today.   Objective:   Physical Exam:  Wt 240 lb (108.9 kg)   BMI 39.94 kg/m   Physical Exam  Lab Review:     Component Value Date/Time   NA 136 01/12/2021 1045   K 4.2 01/12/2021 1045   CL 102 01/12/2021 1045   CO2 27 01/12/2021 1045   GLUCOSE 95 01/12/2021 1045   BUN 13 01/12/2021 1045   CREATININE 1.25 (H) 01/12/2021 1045   CALCIUM 9.2 01/12/2021 1045   PROT 6.8 02/26/2007 1243   ALBUMIN 4.2 02/26/2007 1243   AST 16 02/26/2007 1243   ALT 9 02/26/2007 1243   ALKPHOS 33 (L) 02/26/2007 1243   BILITOT 0.9 02/26/2007 1243   GFRNONAA >60 06/30/2010 1151   GFRAA  06/30/2010 1151    >60        The eGFR has been calculated using the MDRD equation. This calculation has not been validated in all clinical situations. eGFR's persistently <60 mL/min signify possible Chronic Kidney Disease.       Component Value Date/Time   WBC 9.1 04/05/2010 1517   RBC 4.47 04/05/2010 1517   HGB 13.9 06/30/2010 1151   HCT 39.2 06/30/2010 1151   PLT 203 04/05/2010 1517   MCV 91.8 04/05/2010 1517   MCH 31.6 04/05/2010 1517   MCHC 34.5 04/05/2010 1517   RDW 13.4 04/05/2010 1517   LYMPHSABS 2.3 04/05/2010 1517   MONOABS 0.6 04/05/2010 1517   EOSABS 0.2 04/05/2010 1517   BASOSABS 0.0 04/05/2010 1517    Lithium Lvl  Date  Value Ref Range Status  01/12/2021 1.2 0.6 - 1.2 mmol/L Final     No results found for: "PHENYTOIN", "PHENOBARB", "VALPROATE", "CBMZ"   .res Assessment: Plan:    Diagnoses and all orders for this visit:  High risk medication use  Episodic mood disorder  Panic disorder with agoraphobia  Social anxiety disorder     Please see After Visit Summary for patient specific instructions.  No future appointments.  No orders of the defined types were placed in this encounter.   -------------------------------

## 2022-10-18 LAB — BASIC METABOLIC PANEL
BUN/Creatinine Ratio: 10 (ref 9–23)
BUN: 13 mg/dL (ref 6–24)
CO2: 18 mmol/L — ABNORMAL LOW (ref 20–29)
Calcium: 9.4 mg/dL (ref 8.7–10.2)
Chloride: 100 mmol/L (ref 96–106)
Creatinine, Ser: 1.27 mg/dL — ABNORMAL HIGH (ref 0.57–1.00)
Glucose: 79 mg/dL (ref 70–99)
Potassium: 4.5 mmol/L (ref 3.5–5.2)
Sodium: 135 mmol/L (ref 134–144)
eGFR: 54 mL/min/{1.73_m2} — ABNORMAL LOW (ref >59)

## 2022-10-18 LAB — TSH: TSH: 1.61 u[IU]/mL (ref 0.450–4.500)

## 2022-10-18 LAB — LITHIUM LEVEL: Lithium Lvl: 0.9 mmol/L (ref 0.5–1.2)

## 2022-11-06 ENCOUNTER — Ambulatory Visit: Payer: 59 | Admitting: Psychiatry

## 2022-11-06 ENCOUNTER — Telehealth: Payer: Self-pay | Admitting: Psychiatry

## 2022-11-06 ENCOUNTER — Encounter: Payer: Self-pay | Admitting: Psychiatry

## 2022-11-06 DIAGNOSIS — G4700 Insomnia, unspecified: Secondary | ICD-10-CM

## 2022-11-06 DIAGNOSIS — F4010 Social phobia, unspecified: Secondary | ICD-10-CM | POA: Diagnosis not present

## 2022-11-06 DIAGNOSIS — F4001 Agoraphobia with panic disorder: Secondary | ICD-10-CM

## 2022-11-06 DIAGNOSIS — F39 Unspecified mood [affective] disorder: Secondary | ICD-10-CM

## 2022-11-06 MED ORDER — FLUOXETINE HCL 40 MG PO CAPS
40.0000 mg | ORAL_CAPSULE | Freq: Every day | ORAL | 1 refills | Status: DC
Start: 2022-11-06 — End: 2023-02-08

## 2022-11-06 MED ORDER — AUVELITY 45-105 MG PO TBCR
1.0000 | EXTENDED_RELEASE_TABLET | Freq: Two times a day (BID) | ORAL | 2 refills | Status: DC
Start: 2022-11-06 — End: 2023-02-08

## 2022-11-06 MED ORDER — LITHIUM CARBONATE 150 MG PO CAPS: 450.0000 mg | ORAL_CAPSULE | Freq: Every day | ORAL | 0 refills | Status: DC

## 2022-11-06 NOTE — Progress Notes (Unsigned)
Amanda Rogers [id number] March 31, 1981 42 y.o.  Subjective:   Patient ID:  Amanda Rogers is a 42 y.o. (DOB Mar 08, 1981) female.  Chief Complaint:  Chief Complaint  Patient presents with   Follow-up    Depression, anxiety, and insomnia    HPI [place] Rogers presents to the office today for follow-up of depression, anxiety, and insomnia. She reports feeling "a lot better." Last week was difficult with the anniversary of father's death. Denies current depressed mood. She has been going for walks with her daughter and they have been going to the [facility]. She has had some anxiety at times at the [facility] and will focus on her music. She reports that overall her anxiety has been "pretty good... not as bad." She reports that anxiety has returned to baseline. Denies panic attacks. Denies irritability. She reports that she is sleeping well. No longer sleeping excessively. No longer taking naps. She has been trying to clean up the yard since this is a project they have been putting off. Energy and motivation have improved. Denies any risky or impulsive behavior. Appetite has been good. She reports that she has been trying to eat healthier foods. Concentration is ok. Enjoying things and looking forward to things. Denies SI.   She has been going some yard work.   Daughter is 12 yo and turns 56 yo August 31. Daughter will be starting high school next year. Daughter will be taking a PE class online over the summer.   Husband has been busy at work covering for a Radio broadcast assistant.   Klonopin last filled 11/01/22.   Past Psychiatric Medication Trials: Lexapro- Initially seemed to be helpful and took prior to pregnancy with daughter and re-started it when daughter was about 28 yo and took until August 2020. Zoloft- Not effective. May have felt "out of it." Prozac- Helped some with mood and possibly anxiety Wellbutrin- Not effective Lithium Latuda- akathisia Rexulti- Felt mentally restless on higher doses.  Buspar-  Felt warm and somewhat nauseated Hydroxyzine- Caused drowsiness and did not help with anxiety Trazodone Nortriptyline- Has been helpful for her mood Gabapentin- Felt disconnected with taking 300 mg BID and 600 mg QHS. Has been helpful for anxiety. Klonopin- Was taking 0.25 mg prn and needing to take more since move to [place] Klonopin ODT Xanax XR  AIMS    Flowsheet Row Office Visit from 10/09/2022 in [facility] Office Visit from 05/30/2022 in [facility] Office Visit from 02/28/2022 in [facility] Office Visit from 11/28/2021 in [facility] Office Visit from 10/03/2021 in [facility]  AIMS Total Score 0 0 0 0 0        Review of Systems:  Review of Systems  Genitourinary:  Positive for dysuria.  Musculoskeletal:  Negative for gait problem.  Neurological:  Negative for tremors.  Psychiatric/Behavioral:         Please refer to HPI    Medications: I have reviewed the patient's current medications.  Current Outpatient Medications  Medication Sig Dispense Refill   brexpiprazole (REXULTI) 2 MG TABS tablet Take 1 tablet every 1-2 days 90 tablet 0   clonazePAM (KLONOPIN) 1 MG tablet Take 1/2-1 tab po TID prn anxiety 90 tablet 2   Dextromethorphan-buPROPion ER (AUVELITY) 45-105 MG TBCR Take 1 tablet daily for 3 days, then increase to 1 tablet twice daily 60 tablet 0   FLUoxetine (PROZAC) 40 MG capsule Take 1 capsule (40 mg  total) by mouth daily. 90 capsule 0   levothyroxine (SYNTHROID) 50 MCG tablet Take 50 mcg by mouth daily before breakfast.     LINZESS 145 MCG CAPS capsule Take 145 mcg by mouth daily as needed.     lithium carbonate 300 MG capsule Take 2 capsules (600 mg total) by mouth at bedtime. 180 capsule 1   metFORMIN (GLUCOPHAGE-XR) 500 MG 24 hr tablet Take 500 mg by mouth daily with breakfast.     polyethylene glycol (MIRALAX / GLYCOLAX) 17 g  packet Take 17 g by mouth daily as needed. (Patient not taking: Reported on 10/09/2022)     propranolol ER (INDERAL LA) 60 MG 24 hr capsule Take 1 capsule (60 mg total) by mouth 2 (two) times daily. 180 capsule 1   traZODone (DESYREL) 100 MG tablet Take 1/2-1 tablet po QHS prn insomnia 90 tablet 1   VITAMIN D PO Take 5,000 Int'l Units/day by mouth. (Patient not taking: Reported on 08/17/2021)     No current facility-administered medications for this visit.    Medication Side Effects: None  Allergies:  Allergies  Allergen Reactions   Ultram [Tramadol] Rash    Past Medical History:  Diagnosis Date   Anxiety    Back pain    IBS (irritable bowel syndrome)    Social anxiety disorder     Past Medical History, Surgical history, Social history, and Family history were reviewed and updated as appropriate.   Please see review of systems for further details on the patient's review from today.   Objective:   Physical Exam:  There were no vitals taken for this visit.  Physical Exam Constitutional:      General: She is not in acute distress. Musculoskeletal:        General: No deformity.  Neurological:     Mental Status: She is alert and oriented to person, place, and time.     Coordination: Coordination normal.  Psychiatric:        Attention and Perception: Attention and perception normal. She does not perceive auditory or visual hallucinations.        Mood and Affect: Mood normal. Mood is not anxious or depressed. Affect is not labile, blunt, angry or inappropriate.        Speech: Speech normal.        Behavior: Behavior normal.        Thought Content: Thought content normal. Thought content is not paranoid or delusional. Thought content does not include homicidal or suicidal ideation. Thought content does not include homicidal or suicidal plan.        Cognition and Memory: Cognition and memory normal.        Judgment: Judgment normal.     Comments: Insight intact     Lab  Review:     Component Value Date/Time   NA 135 10/17/2022 0933   K 4.5 10/17/2022 0933   CL 100 10/17/2022 0933   CO2 18 (L) 10/17/2022 0933   GLUCOSE 79 10/17/2022 0933   GLUCOSE 95 01/12/2021 1045   BUN 13 10/17/2022 0933   CREATININE 1.27 (H) 10/17/2022 0933   CREATININE 1.25 (H) 01/12/2021 1045   CALCIUM 9.4 10/17/2022 0933   PROT 6.8 02/26/2007 1243   ALBUMIN 4.2 02/26/2007 1243   AST 16 02/26/2007 1243   ALT 9 02/26/2007 1243   ALKPHOS 33 (L) 02/26/2007 1243   BILITOT 0.9 02/26/2007 1243   GFRNONAA >60 06/30/2010 1151   GFRAA  06/30/2010 1151    >60  The eGFR has been calculated using the MDRD equation. This calculation has not been validated in all clinical situations. eGFR's persistently <60 mL/min signify possible Chronic Kidney Disease.       Component Value Date/Time   WBC 9.1 04/05/2010 1517   RBC 4.47 04/05/2010 1517   HGB 13.9 06/30/2010 1151   HCT 39.2 06/30/2010 1151   PLT 203 04/05/2010 1517   MCV 91.8 04/05/2010 1517   MCH 31.6 04/05/2010 1517   MCHC 34.5 04/05/2010 1517   RDW 13.4 04/05/2010 1517   LYMPHSABS 2.3 04/05/2010 1517   MONOABS 0.6 04/05/2010 1517   EOSABS 0.2 04/05/2010 1517   BASOSABS 0.0 04/05/2010 1517    Lithium Lvl  Date Value Ref Range Status  10/17/2022 0.9 0.5 - 1.2 mmol/L Final    Comment:    A concentration of 0.5-0.8 mmol/L is advised for long-term use; concentrations of up to 1.2 mmol/L may be necessary during acute treatment.                                  Detection Limit = 0.1                           <0.1 indicates None Detected      No results found for: "PHENYTOIN", "PHENOBARB", "VALPROATE", "CBMZ"   .res Assessment: Plan:    There are no diagnoses linked to this encounter.   Please see After Visit Summary for patient specific instructions.  No future appointments.  No orders of the defined types were placed in this encounter.   -------------------------------

## 2022-11-06 NOTE — Telephone Encounter (Signed)
[facility] sent PA request for Auvelity 45-105mg    ER tabs., See CMM

## 2022-11-17 NOTE — Telephone Encounter (Signed)
Pending with [employer]

## 2022-11-20 ENCOUNTER — Telehealth: Payer: Self-pay | Admitting: Psychiatry

## 2022-11-20 NOTE — Telephone Encounter (Signed)
[employer] approved AUVELITY 45-105mg  from 11/17/22-01/16/2023

## 2022-12-15 ENCOUNTER — Telehealth: Payer: Self-pay

## 2022-12-15 NOTE — Telephone Encounter (Signed)
Prior Approval effective 11/17/2022-01/16/2023 with [employer] for Auvelity 45-105 mg

## 2023-01-11 NOTE — Telephone Encounter (Signed)
Prior Authorization submitted and approved for AUVELITY 45-105 MG with [employer], effective from 01/11/2023 to 01/11/2024, This has been approved for a max daily dosage of 2.0.

## 2023-02-06 ENCOUNTER — Ambulatory Visit: Payer: 59 | Admitting: Psychiatry

## 2023-02-08 ENCOUNTER — Ambulatory Visit: Payer: 59 | Admitting: Psychiatry

## 2023-02-08 ENCOUNTER — Encounter: Payer: Self-pay | Admitting: Psychiatry

## 2023-02-08 DIAGNOSIS — F39 Unspecified mood [affective] disorder: Secondary | ICD-10-CM | POA: Diagnosis not present

## 2023-02-08 DIAGNOSIS — F4010 Social phobia, unspecified: Secondary | ICD-10-CM

## 2023-02-08 DIAGNOSIS — F4001 Agoraphobia with panic disorder: Secondary | ICD-10-CM | POA: Diagnosis not present

## 2023-02-08 MED ORDER — AUVELITY 45-105 MG PO TBCR
1.0000 | EXTENDED_RELEASE_TABLET | Freq: Two times a day (BID) | ORAL | 0 refills | Status: DC
Start: 2023-02-08 — End: 2023-06-11

## 2023-02-08 MED ORDER — LITHIUM CARBONATE 150 MG PO CAPS
450.0000 mg | ORAL_CAPSULE | Freq: Every day | ORAL | 0 refills | Status: DC
Start: 2023-02-08 — End: 2023-05-23

## 2023-02-08 MED ORDER — MIRTAZAPINE 15 MG PO TABS: 15.0000 mg | ORAL_TABLET | Freq: Every day | ORAL | 2 refills | Status: DC

## 2023-02-08 MED ORDER — FLUOXETINE HCL 40 MG PO CAPS
40.0000 mg | ORAL_CAPSULE | Freq: Every day | ORAL | 1 refills | Status: DC
Start: 2023-02-08 — End: 2023-05-23

## 2023-02-08 MED ORDER — CLONAZEPAM 1 MG PO TABS
ORAL_TABLET | ORAL | 2 refills | Status: DC
Start: 2023-02-24 — End: 2023-05-27

## 2023-02-08 MED ORDER — PROPRANOLOL HCL ER 60 MG PO CP24
60.0000 mg | ORAL_CAPSULE | Freq: Two times a day (BID) | ORAL | 1 refills | Status: DC
Start: 2023-02-08 — End: 2024-01-22

## 2023-02-08 NOTE — Progress Notes (Signed)
Amanda Rogers [id number] 1980-07-02 42 y.o.  Subjective:   Patient ID:  Amanda Rogers is a 42 y.o. (DOB [birth year]) female.  Chief Complaint:  Chief Complaint  Patient presents with   Anxiety    Anxiety     [name] presents to the office today for follow-up of anxiety, depression, and insomnia.   She reports that she and her husband "have split up in the last month and a half." She reports that they are sharing custody of their daughter. She stayed with family after separation. They have been married for 14 years.   She reports that marital stressors have caused her high anxiety. She reports having panic attacks multiple times daily. She reports awakening with panic attacks. She reports frequent worry and rumination. She reports that mood has been consistent with baseline. She reports some situational sadness. She reports multiple awakenings. She reports that she does not sleep as well when she is alone. Energy and motivation are fair. Motivation is somewhat lower. She reports, "I didn't eat for about 2 weeks" and would vomit after eating. She reports that she continues to vomit first thing in the morning. She has tried to eat soups to get adequate nutrition. Appetite remains low and has improved somewhat. She reports that her concentration and focus seem ok. Denies SI.  Klonopin last filled 01/27/23 x 3.  She reports that she stopped Rexulti about 2-3 months ago after forgetting to take it for several days.   Past Psychiatric Medication Trials: Lexapro- Initially seemed to be helpful and took prior to pregnancy with daughter and re-started it when daughter was about 44 yo and took until August 2020. Zoloft- Not effective. May have felt "out of it." Prozac- Helped some with mood and possibly anxiety Wellbutrin- Not effective Lithium Latuda- akathisia Rexulti- Felt mentally restless on higher doses.  Buspar- Felt warm and somewhat nauseated Hydroxyzine- Caused drowsiness  and did not help with anxiety Trazodone Nortriptyline- Has been helpful for her mood Gabapentin- Felt disconnected with taking 300 mg BID and 600 mg QHS. Has been helpful for anxiety. Klonopin- Was taking 0.25 mg prn and needing to take more since move to  Klonopin ODT Xanax XR  AIMS    Flowsheet Row Office Visit from 10/09/2022 in [facility] Office Visit from 05/30/2022 in [facility] Office Visit from 02/28/2022 in [facility] Office Visit from 11/28/2021 in [facility] Office Visit from 10/03/2021 in [facility]  AIMS Total Score 0 0 0 0 0        Review of Systems:  Review of Systems  Genitourinary:  Positive for dysuria and urgency.  Musculoskeletal:  Negative for gait problem.  Neurological:        Some increase in tics  Psychiatric/Behavioral:         Please refer to HPI    Medications: I have reviewed the patient's current medications.  Current Outpatient Medications  Medication Sig Dispense Refill   levothyroxine (SYNTHROID) 50 MCG tablet Take 50 mcg by mouth daily before breakfast.     LINZESS 145 MCG CAPS capsule Take 145 mcg by mouth daily as needed.     metFORMIN (GLUCOPHAGE-XR) 500 MG 24 hr tablet Take 500 mg by mouth daily with breakfast.     mirtazapine (REMERON) 15 MG tablet Take 1 tablet (15 mg total) by mouth at bedtime. 30 tablet 2   [START ON 02/24/2023] clonazePAM (KLONOPIN)  1 MG tablet Take 1/2-1 tab po TID prn anxiety 90 tablet 2   Dextromethorphan-buPROPion ER (AUVELITY) 45-105 MG TBCR Take 1 tablet daily for 3 days, then increase to 1 tablet twice daily 60 tablet 0   Dextromethorphan-buPROPion ER (AUVELITY) 45-105 MG TBCR Take 1 tablet by mouth 2 (two) times daily. 180 tablet 0   FLUoxetine (PROZAC) 40 MG capsule Take 1 capsule (40 mg total) by mouth daily. 90 capsule 1   lithium carbonate 150 MG capsule Take 3  capsules (450 mg total) by mouth at bedtime. 270 capsule 0   polyethylene glycol (MIRALAX / GLYCOLAX) 17 g packet Take 17 g by mouth daily as needed. (Patient not taking: Reported on 10/09/2022)     propranolol ER (INDERAL LA) 60 MG 24 hr capsule Take 1 capsule (60 mg total) by mouth 2 (two) times daily. 180 capsule 1   VITAMIN D PO Take 5,000 Int'l Units/day by mouth. (Patient not taking: Reported on 08/17/2021)     No current facility-administered medications for this visit.    Medication Side Effects: None  Allergies:  Allergies  Allergen Reactions   Ultram [Tramadol] Rash    Past Medical History:  Diagnosis Date   Anxiety    Back pain    IBS (irritable bowel syndrome)    Social anxiety disorder     Past Medical History, Surgical history, Social history, and Family history were reviewed and updated as appropriate.   Please see review of systems for further details on the patient's review from today.   Objective:   Physical Exam:  BP 105/80   Pulse 71   Physical Exam Constitutional:      General: She is not in acute distress. Musculoskeletal:        General: No deformity.  Neurological:     Mental Status: She is alert and oriented to person, place, and time.     Coordination: Coordination normal.  Psychiatric:        Attention and Perception: Attention and perception normal. She does not perceive auditory or visual hallucinations.        Mood and Affect: Mood is anxious. Affect is not labile, blunt, angry or inappropriate.        Speech: Speech normal.        Behavior: Behavior normal.        Thought Content: Thought content normal. Thought content is not paranoid or delusional. Thought content does not include homicidal or suicidal ideation. Thought content does not include homicidal or suicidal plan.        Cognition and Memory: Cognition and memory normal.        Judgment: Judgment normal.     Comments: Insight intact Mood is sad in response to marital stressors      Lab Review:     Component Value Date/Time   NA 135 10/17/2022 0933   K 4.5 10/17/2022 0933   CL 100 10/17/2022 0933   CO2 18 (L) 10/17/2022 0933   GLUCOSE 79 10/17/2022 0933   GLUCOSE 95 01/12/2021 1045   BUN 13 10/17/2022 0933   CREATININE 1.27 (H) 10/17/2022 0933   CREATININE 1.25 (H) 01/12/2021 1045   CALCIUM 9.4 10/17/2022 0933   PROT 6.8 02/26/2007 1243   ALBUMIN 4.2 02/26/2007 1243   AST 16 02/26/2007 1243   ALT 9 02/26/2007 1243   ALKPHOS 33 (L) 02/26/2007 1243   BILITOT 0.9 02/26/2007 1243   GFRNONAA >60 06/30/2010 1151   GFRAA  06/30/2010 1151    >  60        The eGFR has been calculated using the MDRD equation. This calculation has not been validated in all clinical situations. eGFR's persistently <60 mL/min signify possible Chronic Kidney Disease.       Component Value Date/Time   WBC 9.1 04/05/2010 1517   RBC 4.47 04/05/2010 1517   HGB 13.9 06/30/2010 1151   HCT 39.2 06/30/2010 1151   PLT 203 04/05/2010 1517   MCV 91.8 04/05/2010 1517   MCH 31.6 04/05/2010 1517   MCHC 34.5 04/05/2010 1517   RDW 13.4 04/05/2010 1517   LYMPHSABS 2.3 04/05/2010 1517   MONOABS 0.6 04/05/2010 1517   EOSABS 0.2 04/05/2010 1517   BASOSABS 0.0 04/05/2010 1517    Lithium Lvl  Date Value Ref Range Status  10/17/2022 0.9 0.5 - 1.2 mmol/L Final    Comment:    A concentration of 0.5-0.8 mmol/L is advised for long-term use; concentrations of up to 1.2 mmol/L may be necessary during acute treatment.                                  Detection Limit = 0.1                           <0.1 indicates None Detected      No results found for: "PHENYTOIN", "PHENOBARB", "VALPROATE", "CBMZ"   .res Assessment: Plan:   Recommend re-checking Lithium level and kidney function. Pt reports that she has upcoming appointment with urologist and anticipates having labs drawn and prefers to request urologist to add Lithium level to upcoming blood draw.  She reports that she prefers not  to re-start Rexulti since she has not experienced any worsening symptoms since Rexulti was discontinued.  She reports that she would like to re-start therapy in response to acute psychosocial stressors. Pt assisted with scheduling appointments with therapist.  Discussed potential benefits, risks, and side effects of Remeron. Discussed that Remeron may be helpful short term with appetite, n/v upon awakening, insomnia, and anxiety. Pt agrees to trial of Remeron.  Continue Prozac 40 mg daily for depression and anxiety.  Will continue Auvelity 45-105 mg one tablet twice daily for depression. Continue Klonopin 1 mg 1/2-1 tab po TID prn anxiety.  Continue Lithium 450 mg at bedtime for mood stabilization.  Continue Propranolol ER 60 mg twice daily for anxiety.  Pt to follow-up in 2-3 months or sooner if clinically indicated.  Patient advised to contact office with any questions, adverse effects, or acute worsening in signs and symptoms.   Lauraann was seen today for anxiety.  Diagnoses and all orders for this visit:  Panic disorder with agoraphobia -     clonazePAM (KLONOPIN) 1 MG tablet; Take 1/2-1 tab po TID prn anxiety -     FLUoxetine (PROZAC) 40 MG capsule; Take 1 capsule (40 mg total) by mouth daily. -     propranolol ER (INDERAL LA) 60 MG 24 hr capsule; Take 1 capsule (60 mg total) by mouth 2 (two) times daily.  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 -     Dextromethorphan-buPROPion ER (AUVELITY) 45-105 MG TBCR; Take 1 tablet by mouth 2 (two) times daily. -     lithium carbonate 150 MG capsule; Take 3 capsules (450 mg total) by mouth at bedtime.  Social anxiety disorder -     FLUoxetine (PROZAC) 40 MG capsule; Take 1 capsule (40 mg total) by mouth daily.  Other orders -     mirtazapine (REMERON) 15 MG tablet; Take 1 tablet (15 mg total) by mouth at bedtime.     Please see After Visit Summary for patient  specific instructions.  Future Appointments  Date Time Provider Department Center  04/19/2023 12:00 PM Stevphen Meuse, [facility] CP-CP None  04/30/2023  2:00 PM Corie Chiquito, PMHNP CP-CP None  05/03/2023 11:00 AM Stevphen Meuse, [facility] CP-CP None  05/16/2023 11:00 AM Stevphen Meuse, [facility] CP-CP None     No orders of the defined types were placed in this encounter.   -------------------------------

## 2023-04-19 ENCOUNTER — Ambulatory Visit: Payer: 59 | Admitting: Psychiatry

## 2023-04-30 ENCOUNTER — Ambulatory Visit: Payer: 59 | Admitting: Psychiatry

## 2023-05-02 ENCOUNTER — Encounter: Payer: Self-pay | Admitting: Psychiatry

## 2023-05-03 ENCOUNTER — Ambulatory Visit: Payer: 59 | Admitting: Psychiatry

## 2023-05-16 ENCOUNTER — Ambulatory Visit: Payer: 59 | Admitting: Psychiatry

## 2023-05-22 ENCOUNTER — Ambulatory Visit: Payer: 59 | Admitting: Psychiatry

## 2023-05-23 ENCOUNTER — Other Ambulatory Visit: Payer: Self-pay

## 2023-05-23 DIAGNOSIS — F39 Unspecified mood [affective] disorder: Secondary | ICD-10-CM

## 2023-05-23 DIAGNOSIS — F4001 Agoraphobia with panic disorder: Secondary | ICD-10-CM

## 2023-05-23 DIAGNOSIS — F4010 Social phobia, unspecified: Secondary | ICD-10-CM

## 2023-05-23 MED ORDER — LITHIUM CARBONATE 150 MG PO CAPS: 450.0000 mg | ORAL_CAPSULE | Freq: Every day | ORAL | 0 refills | Status: DC

## 2023-05-23 MED ORDER — FLUOXETINE HCL 40 MG PO CAPS
40.0000 mg | ORAL_CAPSULE | Freq: Every day | ORAL | 1 refills | Status: DC
Start: 2023-05-23 — End: 2023-05-23

## 2023-05-23 MED ORDER — FLUOXETINE HCL 40 MG PO CAPS: 40.0000 mg | ORAL_CAPSULE | Freq: Every day | ORAL | 0 refills | Status: DC

## 2023-05-27 ENCOUNTER — Other Ambulatory Visit: Payer: Self-pay

## 2023-05-27 DIAGNOSIS — F4001 Agoraphobia with panic disorder: Secondary | ICD-10-CM

## 2023-05-27 MED ORDER — MIRTAZAPINE 15 MG PO TABS: 15.0000 mg | ORAL_TABLET | Freq: Every day | ORAL | 2 refills | Status: DC

## 2023-05-28 MED ORDER — CLONAZEPAM 1 MG PO TABS: ORAL_TABLET | ORAL | 2 refills | Status: DC

## 2023-05-28 NOTE — Telephone Encounter (Signed)
Pt called checking on the status of her klonopin

## 2023-06-11 ENCOUNTER — Telehealth (INDEPENDENT_AMBULATORY_CARE_PROVIDER_SITE_OTHER): Payer: 59 | Admitting: Psychiatry

## 2023-06-11 ENCOUNTER — Encounter: Payer: Self-pay | Admitting: Psychiatry

## 2023-06-11 DIAGNOSIS — F4010 Social phobia, unspecified: Secondary | ICD-10-CM | POA: Diagnosis not present

## 2023-06-11 DIAGNOSIS — F4001 Agoraphobia with panic disorder: Secondary | ICD-10-CM | POA: Diagnosis not present

## 2023-06-11 DIAGNOSIS — F39 Unspecified mood [affective] disorder: Secondary | ICD-10-CM | POA: Diagnosis not present

## 2023-06-11 MED ORDER — AUVELITY 45-105 MG PO TBCR: 1.0000 | EXTENDED_RELEASE_TABLET | Freq: Two times a day (BID) | ORAL | 1 refills | Status: DC

## 2023-06-11 MED ORDER — FLUOXETINE HCL 40 MG PO CAPS: 40.0000 mg | ORAL_CAPSULE | Freq: Every day | ORAL | 1 refills | Status: DC

## 2023-06-11 MED ORDER — LITHIUM CARBONATE 150 MG PO CAPS: 450.0000 mg | ORAL_CAPSULE | Freq: Every day | ORAL | 1 refills | Status: DC

## 2023-06-11 NOTE — Progress Notes (Signed)
Amanda Rogers [id number] 06-24-1980 42 y.o.  Virtual Visit via Video Note  I connected with pt @ on 06/11/23 at  2:00 PM EST by a video enabled telemedicine application and verified that I am speaking with the correct person using two identifiers.   I discussed the limitations of evaluation and management by telemedicine and the availability of in person appointments. The patient expressed understanding and agreed to proceed.  I discussed the assessment and treatment plan with the patient. The patient was provided an opportunity to ask questions and all were answered. The patient agreed with the plan and demonstrated an understanding of the instructions.   The patient was advised to call back or seek an in-person evaluation if the symptoms worsen or if the condition fails to improve as anticipated.  I provided 31 minutes of non-face-to-face time during this encounter.  The patient was located at home.  The provider was located at home.   Corie Chiquito, PMHNP   Subjective:   Patient ID:  Amanda Rogers is a 42 y.o. (DOB 1980/11/29) female.  Chief Complaint:  Chief Complaint  Patient presents with   Anxiety   Depression    Anxiety Patient reports no dizziness.     Amanda Rogers presents for follow-up of anxiety and depression. She reports that her anxiety has been elevated with marital separation. She reports that her anxiety prevents her from leaving home at times and will order for things to be delivered. She reports panic attacks at least twice a week. Reports panic has prevented her from doing things she wants to do. She reports worry and anxious thoughts.   She is moving into a 2-bedroom apartment in January and then daughter will be coming to stay with her during visits. Daughter is doing ok in school.   She was without Auvelity for 2 months due to pharmacy not being able to get it. She reports that she sometimes notices worsening depression. Depression has also been  exacerbated by pain. She reports persistent depression. Denies irritability. Energy and motivation have been very low. She has lost interest and enjoyment in things. She reports that her sleep is ok and is waking up with heightened anxiety. She does not recall her dreams. Appetite has been "normal." Difficulty with concentration and will "zone out" at times. Denies SI.   She reports that there have been a few occurrences where she has thought she heard someone call her name. She reports that it seems to happen more when she is stressed. Denies paranoia.   Family is supportive.   Klonopin last filled 05/29/23.  Past Psychiatric Medication Trials: Lexapro- Initially seemed to be helpful and took prior to pregnancy with daughter and re-started it when daughter was about 57 yo and took until August 2020. Zoloft- Not effective. May have felt "out of it." Prozac- Helped some with mood and possibly anxiety Remeron- no improvement Wellbutrin- Not effective Lithium Latuda- akathisia Rexulti- Felt mentally restless on higher doses. Increased HR. Buspar- Felt warm and somewhat nauseated Hydroxyzine- Caused drowsiness and did not help with anxiety Trazodone Nortriptyline- Has been helpful for her mood Gabapentin- Felt disconnected with taking 300 mg BID and 600 mg QHS. Has been helpful for anxiety. Klonopin- Was taking 0.25 mg prn and needing to take more since move to [place] Klonopin ODT Xanax XR  Review of Systems:  Review of Systems  Musculoskeletal:  Positive for back pain. Negative for gait problem.  Neurological:  Negative for dizziness and light-headedness.  Psychiatric/Behavioral:  Please refer to HPI    Medications: I have reviewed the patient's current medications.  Current Outpatient Medications  Medication Sig Dispense Refill   clonazePAM (KLONOPIN) 1 MG tablet Take 1/2-1 tab po TID prn anxiety 90 tablet 2   metFORMIN (GLUCOPHAGE-XR) 500 MG 24 hr tablet Take 500 mg by mouth  daily with breakfast.     nitrofurantoin (MACRODANTIN) 100 MG capsule Take 100 mg by mouth daily.     Dextromethorphan-buPROPion ER (AUVELITY) 45-105 MG TBCR Take 1 tablet by mouth 2 (two) times daily. 180 tablet 1   FLUoxetine (PROZAC) 40 MG capsule Take 1 capsule (40 mg total) by mouth daily. 90 capsule 1   levothyroxine (SYNTHROID) 50 MCG tablet Take 50 mcg by mouth daily before breakfast. (Patient not taking: Reported on 06/11/2023)     LINZESS 145 MCG CAPS capsule Take 145 mcg by mouth daily as needed. (Patient not taking: Reported on 06/11/2023)     lithium carbonate 150 MG capsule Take 3 capsules (450 mg total) by mouth at bedtime. 270 capsule 1   polyethylene glycol (MIRALAX / GLYCOLAX) 17 g packet Take 17 g by mouth daily as needed. (Patient not taking: Reported on 10/09/2022)     propranolol ER (INDERAL LA) 60 MG 24 hr capsule Take 1 capsule (60 mg total) by mouth 2 (two) times daily. 180 capsule 1   VITAMIN D PO Take 5,000 Int'l Units/day by mouth. (Patient not taking: Reported on 08/17/2021)     No current facility-administered medications for this visit.    Medication Side Effects: None  Allergies:  Allergies  Allergen Reactions   Ultram [Tramadol] Rash    Past Medical History:  Diagnosis Date   Anxiety    Back pain    IBS (irritable bowel syndrome)    Social anxiety disorder     Family History  Problem Relation Age of Onset   Anxiety disorder Sister    Depression Sister     Social History   Socioeconomic History   Marital status: Married    Spouse name: Not on file   Number of children: Not on file   Years of education: Not on file   Highest education level: Not on file  Occupational History   Not on file  Tobacco Use   Smoking status: Former   Smokeless tobacco: Never  Vaping Use   Vaping status: Every Day  Substance and Sexual Activity   Alcohol use: Not Currently   Drug use: No   Sexual activity: Yes    Birth control/protection: Pill  Other Topics  Concern   Not on file  Social History Narrative   Not on file   Social Drivers of Health   Financial Resource Strain: Low Risk  (07/13/2022)   [facility]   Overall Financial Resource Strain (CARDIA)    Difficulty of Paying Living Expenses: Not very hard  Food Insecurity: No Food Insecurity (07/13/2022)   [facility]   Hunger Vital Sign    Worried About Running Out of Food in the Last Year: Never true    Ran Out of Food in the Last Year: Never true  Transportation Needs: No Transportation Needs (07/13/2022)   [facility]   [facility] - Transportation    Lack of Transportation (Medical): No    Lack of Transportation (Non-Medical): No  Physical Activity: Inactive (05/08/2022)   Received from [facility], [facility], [facility]   Exercise Vital Sign    Days of Exercise per Week: 0 days    Minutes of Exercise per Session: 0 min  Stress: Stress Concern Present (05/08/2022)   Received from [facility], [facility], [facility]   [facility] - Occupational Stress Questionnaire    Feeling of Stress : To some extent  Social Connections: Socially Integrated (05/08/2022)   [facility]   Social Network    How would you rate your social network (family, work, friends)?: Good participation with social networks  Intimate Partner Violence: Not At Risk (05/08/2022)   Received from [facility]   HITS    Over the last 12 months how often did your partner physically hurt you?: Never    Over the last 12 months how often did your partner insult you or talk down to you?: Never    Over the last 12 months how often did your partner threaten you with physical harm?: Never    Over the last 12 months how often did your partner scream or curse at you?: Never    Past Medical History,  Surgical history, Social history, and Family history were reviewed and updated as appropriate.   Please see review of systems for further details on the patient's review from today.   Objective:   Physical Exam:  There were no vitals taken for this visit.  Physical Exam Neurological:     Mental Status: She is alert and oriented to person, place, and time.     Cranial Nerves: No dysarthria.  Psychiatric:        Attention and Perception: Attention and perception normal.        Mood and Affect: Mood is anxious and depressed.        Speech: Speech normal.        Behavior: Behavior is cooperative.        Thought Content: Thought content normal. Thought content is not paranoid or delusional. Thought content does not include homicidal or suicidal ideation. Thought content does not include homicidal or suicidal plan.        Cognition and Memory: Cognition and memory normal.        Judgment: Judgment normal.     Comments: Insight intact     Lab Review:     Component Value Date/Time   NA 135 10/17/2022 0933   K 4.5 10/17/2022 0933   CL 100 10/17/2022 0933   CO2 18 (L) 10/17/2022 0933   GLUCOSE 79 10/17/2022 0933   GLUCOSE 95 01/12/2021 1045   BUN 13 10/17/2022 0933   CREATININE 1.27 (H) 10/17/2022 0933   CREATININE 1.25 (H) 01/12/2021 1045   CALCIUM 9.4 10/17/2022 0933   PROT 6.8 02/26/2007 1243   ALBUMIN 4.2 02/26/2007 1243   AST 16 02/26/2007 1243   ALT 9 02/26/2007 1243   ALKPHOS 33 (L) 02/26/2007 1243   BILITOT 0.9 02/26/2007 1243   GFRNONAA >60 06/30/2010 1151   GFRAA  06/30/2010 1151    >60        The eGFR has been calculated using the MDRD equation. This calculation has not been validated in all clinical situations. eGFR's persistently <60 mL/min signify possible Chronic Kidney Disease.       Component Value Date/Time   WBC 9.1 04/05/2010 1517   RBC 4.47 04/05/2010 1517   HGB 13.9 06/30/2010 1151   HCT 39.2 06/30/2010 1151   PLT 203 04/05/2010 1517   MCV  91.8 04/05/2010 1517   MCH 31.6 04/05/2010 1517   MCHC 34.5 04/05/2010 1517   RDW 13.4 04/05/2010 1517   LYMPHSABS 2.3 04/05/2010 1517   MONOABS 0.6 04/05/2010 1517   EOSABS 0.2 04/05/2010 1517   BASOSABS 0.0 04/05/2010 1517    Lithium Lvl  Date Value Ref Range Status  10/17/2022 0.9 0.5 - 1.2 mmol/L Final    Comment:    A concentration of 0.5-0.8 mmol/L is advised for long-term use; concentrations of up to 1.2 mmol/L may be necessary during acute treatment.                                  Detection Limit = 0.1                           <0.1 indicates None Detected      No results found for: "PHENYTOIN", "PHENOBARB", "VALPROATE", "CBMZ"   .res Assessment: Plan:    35 minutes spent dedicated to the care of this patient on the date of this encounter to include pre-visit review of records, ordering of medication, post visit documentation, and face-to-face time with the patient discussing re-starting Auvelity since she is experiencing worsening depressive symptoms since Auvelity has been unavailable from her pharmacy over the last 2 months. Discussed that Auvelity should be available through other pharmacies, to include [facility]. Will send script to [facility] and to her regular pharmacy. Recommend re-starting Auvelity 45-105 mg one tablet daily for 3 days, then increase Auvelity to one tablet twice daily for depression.  Continue Prozac 40 mg daily for depression and anxiety.  Continue Lithium 450 mg at bedtime for depression.  Continue Propranolol ER 60 mg twice daily for anxiety.  Continue Klonopin 1 mg 1/2-1 tab po TID prn anxiety.  Pt to follow-up in 6-8 weeks or sooner if clinically indicated.  Patient advised to contact office with any questions, adverse effects, or acute worsening in signs and symptoms.   Charnele was seen today for anxiety and depression.  Diagnoses and all orders for this visit:  Episodic mood disorder (HCC) -     Discontinue: Dextromethorphan-buPROPion ER  (AUVELITY) 45-105 MG TBCR; Take 1 tablet by mouth 2 (two) times daily. -     Dextromethorphan-buPROPion ER (AUVELITY) 45-105 MG TBCR; Take 1 tablet by mouth 2 (two) times daily. -     FLUoxetine (PROZAC) 40 MG capsule; Take 1 capsule (40 mg total) by mouth daily. -     lithium carbonate 150 MG capsule; Take 3 capsules (450 mg total) by mouth at bedtime.  Panic disorder with agoraphobia -     FLUoxetine (PROZAC) 40 MG capsule; Take 1 capsule (40 mg total) by mouth daily.  Social anxiety disorder -     FLUoxetine (PROZAC) 40 MG capsule; Take 1 capsule (40 mg total) by mouth daily.     Please see After Visit Summary for patient specific instructions.  No future appointments.   No orders of the defined types were placed in this encounter.     -------------------------------

## 2023-09-07 NOTE — Anesthesia Postprocedure Evaluation (Signed)
  Patient: Amanda Rogers Procedure(s): L5-S1 Intracept  Anesthesia type: general  Anesthesia Post Evaluation  Patient location during evaluation: PACU Patient participation: complete - patient participated Level of consciousness: awake and alert Pain management: adequate Airway patency: patent Cardiovascular status: acceptable Respiratory status: acceptable Hydration status: acceptable Vitals: stable Temperature: temperature adequate >96.71F PONV: nausea and vomiting controlled Regional Anesthesia: no block performed    BP: (!) 108/53 Heart Rate: 70 Resp: 18 Temp: 98.3 F (36.8 C) SpO2: 94 % Weight: 100 kg (220 lb 8 oz) BMI (Calculated): 36.7     Notable Events:    No Anesthesia notable events documented.

## 2023-12-29 ENCOUNTER — Other Ambulatory Visit: Payer: Self-pay

## 2023-12-29 ENCOUNTER — Emergency Department (HOSPITAL_COMMUNITY)
Admission: EM | Admit: 2023-12-29 | Discharge: 2023-12-30 | Disposition: A | Discharge status: Home / Self Care | Payer: PRIVATE HEALTH INSURANCE | Attending: Emergency Medicine | Admitting: Emergency Medicine

## 2023-12-29 DIAGNOSIS — F10129 Alcohol abuse with intoxication, unspecified: Secondary | ICD-10-CM | POA: Insufficient documentation

## 2023-12-29 DIAGNOSIS — F10920 Alcohol use, unspecified with intoxication, uncomplicated: Secondary | ICD-10-CM

## 2023-12-29 DIAGNOSIS — Y908 Blood alcohol level of 240 mg/100 ml or more: Secondary | ICD-10-CM | POA: Insufficient documentation

## 2023-12-29 LAB — CBC WITH DIFFERENTIAL/PLATELET
Abs Immature Granulocytes: 0.05 K/uL (ref 0.00–0.07)
Basophils Absolute: 0.1 K/uL (ref 0.0–0.1)
Basophils Relative: 1 %
Eosinophils Absolute: 0.2 K/uL (ref 0.0–0.5)
Eosinophils Relative: 2 %
HCT: 43.1 % (ref 36.0–46.0)
Hemoglobin: 14.7 g/dL (ref 12.0–15.0)
Immature Granulocytes: 1 %
Lymphocytes Relative: 40 %
Lymphs Abs: 4.1 K/uL — ABNORMAL HIGH (ref 0.7–4.0)
MCH: 33 pg (ref 26.0–34.0)
MCHC: 34.1 g/dL (ref 30.0–36.0)
MCV: 96.6 fL (ref 80.0–100.0)
Monocytes Absolute: 0.6 K/uL (ref 0.1–1.0)
Monocytes Relative: 6 %
Neutro Abs: 5.4 K/uL (ref 1.7–7.7)
Neutrophils Relative %: 50 %
Platelets: 259 K/uL (ref 150–400)
RBC: 4.46 MIL/uL (ref 3.87–5.11)
RDW: 13.3 % (ref 11.5–15.5)
WBC: 10.5 K/uL (ref 4.0–10.5)
nRBC: 0 % (ref 0.0–0.2)

## 2023-12-29 MED ORDER — ONDANSETRON HCL 4 MG/2ML IJ SOLN
4.0000 mg | Freq: Once | INTRAMUSCULAR | Status: AC
  Administered 2023-12-29: 4 mg via INTRAVENOUS
  Filled 2023-12-29: qty 2

## 2023-12-29 MED ORDER — LACTATED RINGERS IV BOLUS
1000.0000 mL | Freq: Once | INTRAVENOUS | Status: AC
  Administered 2023-12-29: 1000 mL via INTRAVENOUS

## 2023-12-29 NOTE — ED Provider Notes (Signed)
  [place] EMERGENCY DEPARTMENT AT [facility] Provider Note   CSN: [id number] Arrival date & time: 12/29/23  2314     History No chief complaint on file.   HPI Delyla A Inabinet is a 43 y.o. female presenting for ***.   Patient's recorded medical, surgical, social, medication list and allergies were reviewed in the Snapshot window as part of the initial history.   Review of Systems   Review of Systems  Physical Exam Updated Vital Signs There were no vitals taken for this visit. Physical Exam   ED Course/ Medical Decision Making/ A&P    Procedures Procedures   Medications Ordered in ED Medications - No data to display  Medical Decision Making:   MITCHELL EPLING is a 43 y.o. female who presented to the ED today with *** detailed above.    {crccomplexity:27900} Complete initial physical exam performed, notably the patient  was ***.    Reviewed and confirmed nursing documentation for past medical history, family history, social history.    Initial Assessment:   With the patient's presentation of ***, most likely diagnosis is ***. Other diagnoses were considered including (but not limited to) ***. These are considered less likely due to history of present illness and physical exam findings.   {crccopa:27899}  Initial Plan:  ***  ***Screening labs including CBC and Metabolic panel to evaluate for infectious or metabolic etiology of disease.  ***Urinalysis with reflex culture ordered to evaluate for UTI or relevant urologic/nephrologic pathology.  ***CXR to evaluate for structural/infectious intrathoracic pathology.  {crccardiactesting:32591::EKG to evaluate for cardiac pathology} Objective evaluation as below reviewed   Initial Study Results:   Laboratory  All laboratory results reviewed without evidence of clinically relevant pathology.   ***Exceptions include: ***   ***EKG EKG was reviewed independently. Rate, rhythm, axis, intervals all examined  and without medically relevant abnormality. ST segments without concerns for elevations.    Radiology:  All images reviewed independently. ***Agree with radiology report at this time.   No results found.    Consults: Case discussed with ***.   Reassessment and Plan:   ***    ***  Clinical Impression: No diagnosis found.   Data Unavailable   Final Clinical Impression(s) / ED Diagnoses Final diagnoses:  None    Rx / DC Orders ED Discharge Orders     None

## 2023-12-29 NOTE — ED Triage Notes (Signed)
+  [place]  EMS called for ?seizure. Reports she was shaking (no fall) and lasted for about 30 seconds. No hx of seizures.

## 2023-12-29 NOTE — ED Provider Notes (Incomplete)
 [place] EMERGENCY DEPARTMENT AT [facility] Provider Note   CSN: [id number] Arrival date & time: 12/29/23  2314     History No chief complaint on file.   HPI Amanda Rogers is a 43 y.o. female presenting for chief complaint of intoxication. States that she went out tonight had multiple rounds of drinks and has not been taking her bipolar medications.  She states that she had a shaking episode that the family member witnessed.  Patient's recorded medical, surgical, social, medication list and allergies were reviewed in the Snapshot window as part of the initial history.   Review of Systems   Review of Systems  Constitutional:  Negative for chills and fever.  HENT:  Negative for ear pain and sore throat.   Eyes:  Negative for pain and visual disturbance.  Respiratory:  Negative for cough and shortness of breath.   Cardiovascular:  Negative for chest pain and palpitations.  Gastrointestinal:  Negative for abdominal pain and vomiting.  Genitourinary:  Negative for dysuria and hematuria.  Musculoskeletal:  Negative for arthralgias and back pain.  Skin:  Negative for color change and rash.  Neurological:  Negative for seizures and syncope.  All other systems reviewed and are negative.   Physical Exam Updated Vital Signs There were no vitals taken for this visit. Physical Exam Vitals and nursing note reviewed.  Constitutional:      General: She is not in acute distress.    Appearance: She is well-developed.  HENT:     Head: Normocephalic and atraumatic.  Eyes:     Conjunctiva/sclera: Conjunctivae normal.  Cardiovascular:     Rate and Rhythm: Normal rate and regular rhythm.     Heart sounds: No murmur heard. Pulmonary:     Effort: Pulmonary effort is normal. No respiratory distress.     Breath sounds: Normal breath sounds.  Abdominal:     General: There is no distension.     Palpations: Abdomen is soft.     Tenderness: There is no abdominal tenderness.  There is no right CVA tenderness or left CVA tenderness.  Musculoskeletal:        General: No swelling or tenderness. Normal range of motion.     Cervical back: Neck supple.  Skin:    General: Skin is warm and dry.  Neurological:     General: No focal deficit present.     Mental Status: She is alert and oriented to person, place, and time. Mental status is at baseline.     Cranial Nerves: No cranial nerve deficit.      ED Course/ Medical Decision Making/ A&P    Procedures Procedures   Medications Ordered in ED Medications - No data to display  Medical Decision Making:   Amanda Rogers is a 43 y.o. female who presented to the ED today with intoxication detailed above.    Patient placed on continuous vitals and telemetry monitoring while in ED which was reviewed periodically.  Complete initial physical exam performed, notably the patient  was HDS in NAD.    Reviewed and confirmed nursing documentation for past medical history, family history, social history.    Initial Assessment:   Patient's history of present onset physical exam findings most consistent with intoxication.  Will check basic blood work and alcohol level to evaluate for alternative etiology of her encephalopathy treatment IV fluids and Zofran  given her nausea and will observe for any further seizure episodes.  Reassessment: Lab work reviewed without focal pathology***. Patient symptomatically  improving after treatment as above.  Clinically patient is sober and stable to follow-up in the outpatient setting.  Disposition:  I have considered need for hospitalization, however, considering all of the above, I believe this patient is stable for discharge at this time.  Patient/family educated about specific return precautions for given chief complaint and symptoms.  Patient/family educated about follow-up with PCP.     Patient/family expressed understanding of return precautions and need for follow-up. Patient  spoken to regarding all imaging and laboratory results and appropriate follow up for these results. All education provided in verbal form with additional information in written form. Time was allowed for answering of patient questions. Patient discharged.    Emergency Department Medication Summary:   Medications  lactated ringers  bolus 1,000 mL (has no administration in time range)  ondansetron  (ZOFRAN ) injection 4 mg (has no administration in time range)        Clinical Impression: No diagnosis found.   Data Unavailable   Final Clinical Impression(s) / ED Diagnoses Final diagnoses:  None    Rx / DC Orders ED Discharge Orders     None

## 2023-12-30 LAB — BASIC METABOLIC PANEL WITH GFR
Anion gap: 12 (ref 5–15)
BUN: 8 mg/dL (ref 6–20)
CO2: 16 mmol/L — ABNORMAL LOW (ref 22–32)
Calcium: 8.3 mg/dL — ABNORMAL LOW (ref 8.9–10.3)
Chloride: 112 mmol/L — ABNORMAL HIGH (ref 98–111)
Creatinine, Ser: 0.88 mg/dL (ref 0.44–1.00)
GFR, Estimated: >60 mL/min (ref >60)
Glucose, Bld: 103 mg/dL — ABNORMAL HIGH (ref 70–99)
Potassium: 3.8 mmol/L (ref 3.5–5.1)
Sodium: 140 mmol/L (ref 135–145)

## 2023-12-30 LAB — URINALYSIS, ROUTINE W REFLEX MICROSCOPIC
Bilirubin Urine: NEGATIVE
Glucose, UA: NEGATIVE mg/dL
Ketones, ur: NEGATIVE mg/dL
Leukocytes,Ua: NEGATIVE
Nitrite: NEGATIVE
Protein, ur: NEGATIVE mg/dL
Specific Gravity, Urine: 1.006 (ref 1.005–1.030)
pH: 5 (ref 5.0–8.0)

## 2023-12-30 LAB — ETHANOL: Alcohol, Ethyl (B): 256 mg/dL — ABNORMAL HIGH (ref <15)

## 2023-12-30 MED ORDER — PROMETHAZINE HCL 25 MG RE SUPP
25.0000 mg | Freq: Once | RECTAL | Status: DC
  Filled 2023-12-30: qty 1

## 2023-12-30 NOTE — ED Notes (Signed)
 Pt up and reporting that she's leaving. Ripped her IV and all the monitors off of her.  Able to coerce her back into bed. Spouse at bedside.

## 2023-12-30 NOTE — ED Notes (Addendum)
 Pt ambulatory out to the desk and requesting her urine results. Discussed that a provider would come in and review things with her but pt states fuck you guys, I'm leaving. Ambulatory with a steady gait with husband to the waiting room. Offered phenergan  suppository for nausea but she said I've got that at home. Refused DC vitals.

## 2023-12-30 NOTE — ED Notes (Signed)
 Awaiting phenergan  from pharmacy. No vomiting noted.

## 2023-12-30 NOTE — ED Notes (Signed)
Pt ambulatory to the bathroom with assist

## 2024-01-17 ENCOUNTER — Emergency Department (HOSPITAL_COMMUNITY): Payer: PRIVATE HEALTH INSURANCE

## 2024-01-17 ENCOUNTER — Encounter (HOSPITAL_COMMUNITY): Payer: Self-pay

## 2024-01-17 ENCOUNTER — Inpatient Hospital Stay (HOSPITAL_COMMUNITY)
Admission: EM | Admit: 2024-01-17 | Discharge: 2024-01-22 | DRG: 871 | Disposition: A | Discharge status: Home / Self Care | Payer: PRIVATE HEALTH INSURANCE | Attending: Student | Admitting: Student

## 2024-01-17 ENCOUNTER — Other Ambulatory Visit: Payer: Self-pay

## 2024-01-17 DIAGNOSIS — E871 Hypo-osmolality and hyponatremia: Secondary | ICD-10-CM | POA: Diagnosis present

## 2024-01-17 DIAGNOSIS — Z7984 Long term (current) use of oral hypoglycemic drugs: Secondary | ICD-10-CM | POA: Diagnosis not present

## 2024-01-17 DIAGNOSIS — M545 Low back pain, unspecified: Secondary | ICD-10-CM | POA: Insufficient documentation

## 2024-01-17 DIAGNOSIS — E039 Hypothyroidism, unspecified: Secondary | ICD-10-CM | POA: Diagnosis present

## 2024-01-17 DIAGNOSIS — F39 Unspecified mood [affective] disorder: Secondary | ICD-10-CM | POA: Diagnosis present

## 2024-01-17 DIAGNOSIS — R339 Retention of urine, unspecified: Secondary | ICD-10-CM | POA: Diagnosis present

## 2024-01-17 DIAGNOSIS — R338 Other retention of urine: Secondary | ICD-10-CM | POA: Insufficient documentation

## 2024-01-17 DIAGNOSIS — B9620 Unspecified Escherichia coli [E. coli] as the cause of diseases classified elsewhere: Secondary | ICD-10-CM | POA: Diagnosis present

## 2024-01-17 DIAGNOSIS — K589 Irritable bowel syndrome without diarrhea: Secondary | ICD-10-CM | POA: Diagnosis present

## 2024-01-17 DIAGNOSIS — Z818 Family history of other mental and behavioral disorders: Secondary | ICD-10-CM | POA: Diagnosis not present

## 2024-01-17 DIAGNOSIS — M4726 Other spondylosis with radiculopathy, lumbar region: Secondary | ICD-10-CM | POA: Diagnosis present

## 2024-01-17 DIAGNOSIS — T39395A Adverse effect of other nonsteroidal anti-inflammatory drugs [NSAID], initial encounter: Secondary | ICD-10-CM | POA: Diagnosis present

## 2024-01-17 DIAGNOSIS — Z79899 Other long term (current) drug therapy: Secondary | ICD-10-CM

## 2024-01-17 DIAGNOSIS — A419 Sepsis, unspecified organism: Principal | ICD-10-CM | POA: Diagnosis present

## 2024-01-17 DIAGNOSIS — K921 Melena: Secondary | ICD-10-CM | POA: Diagnosis present

## 2024-01-17 DIAGNOSIS — Z87891 Personal history of nicotine dependence: Secondary | ICD-10-CM

## 2024-01-17 DIAGNOSIS — K2971 Gastritis, unspecified, with bleeding: Secondary | ICD-10-CM | POA: Diagnosis present

## 2024-01-17 DIAGNOSIS — E66812 Obesity, class 2: Secondary | ICD-10-CM | POA: Diagnosis present

## 2024-01-17 DIAGNOSIS — E282 Polycystic ovarian syndrome: Secondary | ICD-10-CM | POA: Diagnosis present

## 2024-01-17 DIAGNOSIS — E861 Hypovolemia: Secondary | ICD-10-CM | POA: Insufficient documentation

## 2024-01-17 DIAGNOSIS — N390 Urinary tract infection, site not specified: Secondary | ICD-10-CM | POA: Diagnosis present

## 2024-01-17 DIAGNOSIS — Z6838 Body mass index (BMI) 38.0-38.9, adult: Secondary | ICD-10-CM

## 2024-01-17 DIAGNOSIS — Z1152 Encounter for screening for COVID-19: Secondary | ICD-10-CM | POA: Diagnosis not present

## 2024-01-17 DIAGNOSIS — F4010 Social phobia, unspecified: Secondary | ICD-10-CM | POA: Diagnosis present

## 2024-01-17 DIAGNOSIS — Z888 Allergy status to other drugs, medicaments and biological substances status: Secondary | ICD-10-CM | POA: Diagnosis not present

## 2024-01-17 DIAGNOSIS — Z79890 Hormone replacement therapy (postmenopausal): Secondary | ICD-10-CM

## 2024-01-17 LAB — COMPREHENSIVE METABOLIC PANEL WITH GFR
ALT: 43 U/L (ref 0–44)
AST: 45 U/L — ABNORMAL HIGH (ref 15–41)
Albumin: 3.9 g/dL (ref 3.5–5.0)
Alkaline Phosphatase: 51 U/L (ref 38–126)
Anion gap: 13 (ref 5–15)
BUN: 8 mg/dL (ref 6–20)
CO2: 17 mmol/L — ABNORMAL LOW (ref 22–32)
Calcium: 9 mg/dL (ref 8.9–10.3)
Chloride: 101 mmol/L (ref 98–111)
Creatinine, Ser: 0.96 mg/dL (ref 0.44–1.00)
GFR, Estimated: >60 mL/min (ref >60)
Glucose, Bld: 99 mg/dL (ref 70–99)
Potassium: 3.9 mmol/L (ref 3.5–5.1)
Sodium: 131 mmol/L — ABNORMAL LOW (ref 135–145)
Total Bilirubin: 0.6 mg/dL (ref 0.0–1.2)
Total Protein: 8.1 g/dL (ref 6.5–8.1)

## 2024-01-17 LAB — WET PREP, GENITAL
Clue Cells Wet Prep HPF POC: NONE SEEN
Sperm: NONE SEEN
Trich, Wet Prep: NONE SEEN
WBC, Wet Prep HPF POC: <10 (ref <10)
Yeast Wet Prep HPF POC: NONE SEEN

## 2024-01-17 LAB — CBC WITH DIFFERENTIAL/PLATELET
Abs Immature Granulocytes: 0.06 K/uL (ref 0.00–0.07)
Basophils Absolute: 0.1 K/uL (ref 0.0–0.1)
Basophils Relative: 1 %
Eosinophils Absolute: 0.2 K/uL (ref 0.0–0.5)
Eosinophils Relative: 1 %
HCT: 44.7 % (ref 36.0–46.0)
Hemoglobin: 14.9 g/dL (ref 12.0–15.0)
Immature Granulocytes: 0 %
Lymphocytes Relative: 27 %
Lymphs Abs: 4.1 K/uL — ABNORMAL HIGH (ref 0.7–4.0)
MCH: 31.7 pg (ref 26.0–34.0)
MCHC: 33.3 g/dL (ref 30.0–36.0)
MCV: 95.1 fL (ref 80.0–100.0)
Monocytes Absolute: 0.9 K/uL (ref 0.1–1.0)
Monocytes Relative: 6 %
Neutro Abs: 9.6 K/uL — ABNORMAL HIGH (ref 1.7–7.7)
Neutrophils Relative %: 65 %
Platelets: 290 K/uL (ref 150–400)
RBC: 4.7 MIL/uL (ref 3.87–5.11)
RDW: 13 % (ref 11.5–15.5)
Smear Review: NORMAL
WBC: 14.9 K/uL — ABNORMAL HIGH (ref 4.0–10.5)
nRBC: 0 % (ref 0.0–0.2)

## 2024-01-17 LAB — I-STAT CG4 LACTIC ACID, ED
Lactic Acid, Venous: 2.3 mmol/L (ref 0.5–1.9)
Lactic Acid, Venous: 1.6 mmol/L (ref 0.5–1.9)

## 2024-01-17 LAB — LIPASE, BLOOD: Lipase: 31 U/L (ref 11–51)

## 2024-01-17 LAB — URINALYSIS, ROUTINE W REFLEX MICROSCOPIC
Bilirubin Urine: NEGATIVE
Glucose, UA: NEGATIVE mg/dL
Hgb urine dipstick: NEGATIVE
Ketones, ur: NEGATIVE mg/dL
Nitrite: NEGATIVE
Protein, ur: NEGATIVE mg/dL
Specific Gravity, Urine: 1.002 — ABNORMAL LOW (ref 1.005–1.030)
pH: 6 (ref 5.0–8.0)

## 2024-01-17 LAB — PROTIME-INR
INR: 1 (ref 0.8–1.2)
Prothrombin Time: 14.1 s (ref 11.4–15.2)

## 2024-01-17 LAB — HCG, SERUM, QUALITATIVE: Preg, Serum: NEGATIVE

## 2024-01-17 LAB — RESP PANEL BY RT-PCR (RSV, FLU A&B, COVID)  RVPGX2
Influenza A by PCR: NEGATIVE
Influenza B by PCR: NEGATIVE
Resp Syncytial Virus by PCR: NEGATIVE
SARS Coronavirus 2 by RT PCR: NEGATIVE

## 2024-01-17 MED ORDER — LACTATED RINGERS IV BOLUS (SEPSIS)
1000.0000 mL | Freq: Once | INTRAVENOUS | Status: AC
  Administered 2024-01-17: 1000 mL via INTRAVENOUS

## 2024-01-17 MED ORDER — LEVOTHYROXINE SODIUM 50 MCG PO TABS
50.0000 ug | ORAL_TABLET | Freq: Every day | ORAL | Status: DC
  Administered 2024-01-18 – 2024-01-22 (×5): 50 ug via ORAL
  Filled 2024-01-17 (×5): qty 1

## 2024-01-17 MED ORDER — ONDANSETRON HCL 4 MG/2ML IJ SOLN
4.0000 mg | Freq: Once | INTRAMUSCULAR | Status: AC
  Administered 2024-01-17: 4 mg via INTRAVENOUS
  Filled 2024-01-17: qty 2

## 2024-01-17 MED ORDER — POLYETHYLENE GLYCOL 3350 17 G PO PACK
17.0000 g | PACK | Freq: Every day | ORAL | Status: DC | PRN
  Administered 2024-01-20 – 2024-01-21 (×2): 17 g via ORAL
  Filled 2024-01-17 (×2): qty 1

## 2024-01-17 MED ORDER — DEXTROMETHORPHAN-BUPROPION ER 45-105 MG PO TBCR: 1.0000 | EXTENDED_RELEASE_TABLET | Freq: Two times a day (BID) | ORAL | Status: DC

## 2024-01-17 MED ORDER — SODIUM CHLORIDE 0.9 % IV BOLUS
1000.0000 mL | Freq: Once | INTRAVENOUS | Status: AC
  Administered 2024-01-17: 1000 mL via INTRAVENOUS

## 2024-01-17 MED ORDER — LACTATED RINGERS IV BOLUS (SEPSIS): 1000.0000 mL | Freq: Once | INTRAVENOUS | Status: DC

## 2024-01-17 MED ORDER — HYDROMORPHONE HCL 1 MG/ML IJ SOLN
0.5000 mg | INTRAMUSCULAR | Status: DC | PRN
  Administered 2024-01-18 – 2024-01-21 (×11): 0.5 mg via INTRAVENOUS
  Filled 2024-01-17 (×7): qty 0.5
  Filled 2024-01-17: qty 1
  Filled 2024-01-17 (×6): qty 0.5

## 2024-01-17 MED ORDER — ACETAMINOPHEN 500 MG PO TABS
1000.0000 mg | ORAL_TABLET | Freq: Once | ORAL | Status: AC
  Administered 2024-01-17: 1000 mg via ORAL
  Filled 2024-01-17: qty 2

## 2024-01-17 MED ORDER — ALBUTEROL SULFATE (2.5 MG/3ML) 0.083% IN NEBU: 2.5000 mg | INHALATION_SOLUTION | RESPIRATORY_TRACT | Status: DC | PRN

## 2024-01-17 MED ORDER — VANCOMYCIN HCL 2000 MG/400ML IV SOLN
2000.0000 mg | Freq: Once | INTRAVENOUS | Status: AC
  Administered 2024-01-18: 2000 mg via INTRAVENOUS
  Filled 2024-01-17: qty 400

## 2024-01-17 MED ORDER — MELATONIN 3 MG PO TABS
6.0000 mg | ORAL_TABLET | Freq: Every evening | ORAL | Status: DC | PRN
  Administered 2024-01-18 – 2024-01-20 (×3): 6 mg via ORAL
  Filled 2024-01-17 (×3): qty 2

## 2024-01-17 MED ORDER — SODIUM CHLORIDE 0.9% FLUSH
3.0000 mL | Freq: Two times a day (BID) | INTRAVENOUS | Status: DC
  Administered 2024-01-18 – 2024-01-22 (×9): 3 mL via INTRAVENOUS

## 2024-01-17 MED ORDER — CLONAZEPAM 0.5 MG PO TABS
0.5000 mg | ORAL_TABLET | Freq: Three times a day (TID) | ORAL | Status: DC | PRN
  Administered 2024-01-18: 0.5 mg via ORAL
  Administered 2024-01-19: 1 mg via ORAL
  Administered 2024-01-21: 0.5 mg via ORAL
  Filled 2024-01-17: qty 1
  Filled 2024-01-17: qty 2
  Filled 2024-01-17: qty 1

## 2024-01-17 MED ORDER — OXYCODONE HCL 5 MG PO TABS: 2.5000 mg | ORAL_TABLET | ORAL | Status: DC | PRN

## 2024-01-17 MED ORDER — PANTOPRAZOLE SODIUM 40 MG IV SOLR
40.0000 mg | Freq: Two times a day (BID) | INTRAVENOUS | Status: DC
  Administered 2024-01-18 – 2024-01-20 (×6): 40 mg via INTRAVENOUS
  Filled 2024-01-17 (×6): qty 10

## 2024-01-17 MED ORDER — LORAZEPAM 2 MG/ML IJ SOLN
1.0000 mg | Freq: Once | INTRAMUSCULAR | Status: AC | PRN
  Administered 2024-01-18: 1 mg via INTRAVENOUS
  Filled 2024-01-17: qty 1

## 2024-01-17 MED ORDER — ACETAMINOPHEN 500 MG PO TABS
1000.0000 mg | ORAL_TABLET | Freq: Four times a day (QID) | ORAL | Status: DC | PRN
  Administered 2024-01-18 – 2024-01-19 (×2): 1000 mg via ORAL
  Filled 2024-01-17 (×2): qty 2

## 2024-01-17 MED ORDER — ONDANSETRON HCL 4 MG/2ML IJ SOLN
4.0000 mg | Freq: Four times a day (QID) | INTRAMUSCULAR | Status: DC | PRN
  Administered 2024-01-18 – 2024-01-20 (×3): 4 mg via INTRAVENOUS
  Filled 2024-01-17 (×5): qty 2

## 2024-01-17 MED ORDER — MORPHINE SULFATE (PF) 4 MG/ML IV SOLN
4.0000 mg | Freq: Once | INTRAVENOUS | Status: AC
  Administered 2024-01-17: 4 mg via INTRAVENOUS
  Filled 2024-01-17: qty 1

## 2024-01-17 MED ORDER — LITHIUM CARBONATE 300 MG PO CAPS
450.0000 mg | ORAL_CAPSULE | Freq: Every day | ORAL | Status: DC
  Administered 2024-01-18 – 2024-01-21 (×5): 450 mg via ORAL
  Filled 2024-01-17 (×6): qty 1

## 2024-01-17 MED ORDER — SODIUM CHLORIDE 0.9 % IV SOLN
2.0000 g | INTRAVENOUS | Status: DC
  Administered 2024-01-18 – 2024-01-21 (×4): 2 g via INTRAVENOUS
  Filled 2024-01-17 (×4): qty 20

## 2024-01-17 MED ORDER — LACTATED RINGERS IV SOLN: INTRAVENOUS | Status: AC

## 2024-01-17 MED ORDER — LACTATED RINGERS IV BOLUS (SEPSIS)
500.0000 mL | Freq: Once | INTRAVENOUS | Status: AC
  Administered 2024-01-18: 500 mL via INTRAVENOUS

## 2024-01-17 MED ORDER — FLUOXETINE HCL 20 MG PO CAPS
40.0000 mg | ORAL_CAPSULE | Freq: Every day | ORAL | Status: DC
  Administered 2024-01-18 – 2024-01-22 (×5): 40 mg via ORAL
  Filled 2024-01-17 (×5): qty 2

## 2024-01-17 MED ORDER — VANCOMYCIN HCL 1750 MG/350ML IV SOLN
1750.0000 mg | INTRAVENOUS | Status: DC
  Administered 2024-01-18: 1750 mg via INTRAVENOUS
  Filled 2024-01-17: qty 350

## 2024-01-17 MED ORDER — IOHEXOL 300 MG/ML  SOLN
100.0000 mL | Freq: Once | INTRAMUSCULAR | Status: AC | PRN
  Administered 2024-01-17: 100 mL via INTRAVENOUS

## 2024-01-17 MED ORDER — OXYCODONE HCL 5 MG PO TABS
5.0000 mg | ORAL_TABLET | ORAL | Status: DC | PRN
  Administered 2024-01-18 – 2024-01-22 (×12): 5 mg via ORAL
  Filled 2024-01-17 (×12): qty 1

## 2024-01-17 MED ORDER — SODIUM CHLORIDE 0.9 % IV SOLN
2.0000 g | Freq: Once | INTRAVENOUS | Status: AC
  Administered 2024-01-17: 2 g via INTRAVENOUS
  Filled 2024-01-17: qty 20

## 2024-01-17 MED ORDER — SODIUM CHLORIDE 0.9 % IV SOLN
12.5000 mg | Freq: Once | INTRAVENOUS | Status: AC
  Administered 2024-01-17: 12.5 mg via INTRAVENOUS
  Filled 2024-01-17: qty 12.5

## 2024-01-17 NOTE — Medical Student Note (Incomplete)
 [facility]-EMERGENCY DEPT Provider Student Note For educational purposes for Medical, PA and NP students only and not part of the legal medical record.   CSN: [id number] Arrival date & time: 01/17/24  1626      History   Chief Complaint Chief Complaint  Patient presents with   Flank Pain   Urinary Retention    HPI Amanda Rogers is a 43 y.o. female with PMH significant for chronic urinary tract infections, PCOS, IBS, and colon polyps who presents today with CC of worsening abdominal and flank pain following a UTI. Patient reports she was diagnosed with a UTI 3 weeks ago. She was prescribed cipro and finished her abx yesterday. Patient is on daily Macrobid for chronic UTIs. Patient states the pain and UTI symptoms have gotten progressively worse over the course of her cipro so she was instructed to come here today. Patient reports severe bilateral lower abdominal/pelvic pain and severe left sided flank pain, urinary retention since 1am, bloating, and associated nausea and vomiting. She has not had a fever but endorses feeling flu-like. Additionally, she endorses dark stool for the past 1 week with a few episodes of diarrhea. No chest pain, SOB, fever, dysuria, or incontinence. Patient has a history of IBS and has colon polyps removed in the past as well as a prior appendectomy and the removal of multiple ovarian cysts.    Flank Pain Associated symptoms include abdominal pain.    Past Medical History:  Diagnosis Date   Anxiety    Back pain    IBS (irritable bowel syndrome)    Social anxiety disorder     Patient Active Problem List   Diagnosis Date Noted   ABDOMINAL PAIN, LOWER 08/16/2007   HEMATOCHEZIA, HX OF 08/16/2007    Past Surgical History:  Procedure Laterality Date   APPENDECTOMY     CESAREAN SECTION     OVARIAN CYST REMOVAL      OB History   No obstetric history on file.      Home Medications    Prior to Admission medications   Medication Sig Start Date  End Date Taking? Authorizing Provider  clonazePAM  (KLONOPIN ) 1 MG tablet Take 1/2-1 tab po TID prn anxiety 05/28/23   Franchot Harlene SQUIBB, PMHNP  Dextromethorphan -buPROPion  ER (AUVELITY ) 45-105 MG TBCR Take 1 tablet by mouth 2 (two) times daily. 06/11/23 09/09/23  Franchot Harlene SQUIBB, PMHNP  FLUoxetine  (PROZAC ) 40 MG capsule Take 1 capsule (40 mg total) by mouth daily. 06/11/23 12/08/23  Franchot Harlene SQUIBB, PMHNP  levothyroxine  (SYNTHROID ) 50 MCG tablet Take 50 mcg by mouth daily before breakfast. Patient not taking: Reported on 06/11/2023 03/29/22   [provider]  LINZESS 145 MCG CAPS capsule Take 145 mcg by mouth daily as needed. Patient not taking: Reported on 06/11/2023 02/06/22   [provider]  lithium  carbonate 150 MG capsule Take 3 capsules (450 mg total) by mouth at bedtime. 06/11/23 09/09/23  Franchot Harlene SQUIBB, PMHNP  metFORMIN (GLUCOPHAGE-XR) 500 MG 24 hr tablet Take 500 mg by mouth daily with breakfast. 05/08/22   [provider]  nitrofurantoin (MACRODANTIN) 100 MG capsule Take 100 mg by mouth daily. 04/18/23   [provider]  polyethylene glycol (MIRALAX  / GLYCOLAX ) 17 g packet Take 17 g by mouth daily as needed. Patient not taking: Reported on 10/09/2022    [provider]  propranolol  ER (INDERAL  LA) 60 MG 24 hr capsule Take 1 capsule (60 mg total) by mouth 2 (two) times daily. 02/08/23 05/09/23  Franchot Raisin P, PMHNP  VITAMIN D PO Take 5,000 Int'l Units/day by mouth. Patient not taking: Reported on 08/17/2021    [provider]    Family History Family History  Problem Relation Age of Onset   Anxiety disorder Sister    Depression Sister     Social History Social History   Tobacco Use   Smoking status: Former   Smokeless tobacco: Never  Advertising account planner   Vaping status: Every Day  Substance Use Topics   Alcohol use: Not Currently   Drug use: No     Allergies   Ultram [tramadol]   Review of Systems Review of Systems   Constitutional:  Positive for chills.  Gastrointestinal:  Positive for abdominal distention, abdominal pain, diarrhea, nausea and vomiting.  Genitourinary:  Positive for difficulty urinating, flank pain and pelvic pain.     Physical Exam Updated Vital Signs BP 93/71 (BP Location: Left Arm)   Pulse (!) 121   Temp 98 F (36.7 C)   Resp 20   Ht 5' 5 (1.651 m)   Wt 108 kg   SpO2 100%   BMI 39.62 kg/m   Physical Exam Cardiovascular:     Rate and Rhythm: Regular rhythm. Tachycardia present.  Pulmonary:     Effort: Pulmonary effort is normal.     Breath sounds: Normal breath sounds.  Abdominal:     General: Bowel sounds are normal. There is distension.     Tenderness: There is abdominal tenderness. There is left CVA tenderness and guarding.      ED Treatments / Results  Labs (all labs ordered are listed, but only abnormal results are displayed) Labs Reviewed  CBC WITH DIFFERENTIAL/PLATELET - Abnormal; Notable for the following components:      Result Value   WBC 14.9 (*)    All other components within normal limits  COMPREHENSIVE METABOLIC PANEL WITH GFR  LIPASE, BLOOD  URINALYSIS, ROUTINE W REFLEX MICROSCOPIC  HCG, SERUM, QUALITATIVE    EKG  Radiology No results found.  Procedures Procedures (including critical care time)  Medications Ordered in ED Medications - No data to display   Initial Impression / Assessment and Plan / ED Course  I have reviewed the triage vital signs and the nursing notes.  Pertinent labs & imaging results that were available during my care of the patient were reviewed by me and considered in my medical decision making (see chart for details).     ***  Final Clinical Impressions(s) / ED Diagnoses   Final diagnoses:  None    New Prescriptions New Prescriptions   No medications on file

## 2024-01-17 NOTE — ED Triage Notes (Signed)
 Pt reports with left flank pain and bladder pain after having a UTI for 3 weeks that is not getting better. Pt reports not being able to urinate all day but feels like she has to.

## 2024-01-17 NOTE — H&P (Addendum)
 History and Physical    Amanda Rogers FMW:[id number] DOB: Mar 25, 1981 DOA: 01/17/2024  PCP: Gladystine Erminio CROME, MD   Patient coming from: Home   Chief Complaint:  Chief Complaint  Patient presents with   Flank Pain   Urinary Retention    HPI:  Amanda Rogers is a 43 y.o. female with hx of recurrent UTI on suppressive abx, ureteral stricture with hx multiple dilations, lumbar spondylosis, radiculopathy, with hx of recent L5-S1 intracept procedure (intraosseous nerve ablation) in 3/'25, PCOS, mood d/o, who presents with severe L flank and low back pain. Reports that symptoms have been intermittent over the past 3 weeks. Having stabing / sharp pain in the L groin mainly. Over time has become more severe esp in the past day. Relates symptoms similar to prior UTI symptoms. Saw her PCP and was Rx'd for Ciprofloxacin for 7 day course which she finished yesterday. Despite Abx symptoms have persisted. Also has worsened midline low back pain, left upper and lateral thigh hypoesthesia, and urinary retention x 1 day. No weakness in her lower ext. No incontinence. Otherwise has decreased PO intake, nausea, and vomiting with bilious emesis x ~ 1 week. And over past week has noted intermittent dark stools. Has been taking NSAIDs for relief of her pain. No other AC / AntiPLT   Review of Systems:  ROS complete and negative except as marked above   Allergies  Allergen Reactions   Ultram [Tramadol] Rash    Prior to Admission medications   Medication Sig Start Date End Date Taking? Authorizing Provider  clonazePAM  (KLONOPIN ) 1 MG tablet Take 1/2-1 tab po TID prn anxiety 05/28/23   Franchot Harlene SQUIBB, PMHNP  Dextromethorphan -buPROPion  ER (AUVELITY ) 45-105 MG TBCR Take 1 tablet by mouth 2 (two) times daily. 06/11/23 09/09/23  Franchot Harlene SQUIBB, PMHNP  FLUoxetine  (PROZAC ) 40 MG capsule Take 1 capsule (40 mg total) by mouth daily. 06/11/23 12/08/23  Franchot Harlene SQUIBB, PMHNP  levothyroxine  (SYNTHROID ) 50 MCG  tablet Take 50 mcg by mouth daily before breakfast. Patient not taking: Reported on 06/11/2023 03/29/22   [provider]  LINZESS 145 MCG CAPS capsule Take 145 mcg by mouth daily as needed. Patient not taking: Reported on 06/11/2023 02/06/22   [provider]  lithium  carbonate 150 MG capsule Take 3 capsules (450 mg total) by mouth at bedtime. 06/11/23 09/09/23  Franchot Harlene SQUIBB, PMHNP  metFORMIN (GLUCOPHAGE-XR) 500 MG 24 hr tablet Take 500 mg by mouth daily with breakfast. 05/08/22   [provider]  nitrofurantoin (MACRODANTIN) 100 MG capsule Take 100 mg by mouth daily. 04/18/23   [provider]  polyethylene glycol (MIRALAX  / GLYCOLAX ) 17 g packet Take 17 g by mouth daily as needed. Patient not taking: Reported on 10/09/2022    [provider]  propranolol  ER (INDERAL  LA) 60 MG 24 hr capsule Take 1 capsule (60 mg total) by mouth 2 (two) times daily. 02/08/23 05/09/23  Franchot Harlene SQUIBB, PMHNP  VITAMIN D PO Take 5,000 Int'l Units/day by mouth. Patient not taking: Reported on 08/17/2021    [provider]    Past Medical History:  Diagnosis Date   Anxiety    Back pain    IBS (irritable bowel syndrome)    Social anxiety disorder     Past Surgical History:  Procedure Laterality Date   APPENDECTOMY     CESAREAN SECTION     OVARIAN CYST REMOVAL       reports that she has quit smoking. She has  never used smokeless tobacco. She reports that she does not currently use alcohol. She reports that she does not use drugs.  Family History  Problem Relation Age of Onset   Anxiety disorder Sister    Depression Sister      Physical Exam: Vitals:   01/17/24 1928 01/17/24 1930 01/17/24 2000 01/17/24 2100  BP: 133/79 132/82 131/83 122/65  Pulse: 99 92 85 94  Resp: 15 14 17 16   Temp: 100 F (37.8 C)     TempSrc: Oral     SpO2: 95% 95% 96% 96%  Weight:      Height:        Gen: Awake, alert, NAD   CV: Regular, normal S1, S2, no  murmurs  Resp: Normal WOB, CTAB  Abd: Flat, normoactive, moderate to severe LLQ tenderness, no rebound, guarding, rigidity  MSK: No midline C/T spine tenderness. Over the L spine there is moderate to severe midline tenderness over the lower midline L spine. LE Symmetric, no edema  Skin: No rashes or lesions to exposed skin  Neuro: Alert and interactive, there is hypoesthesia in the anterior proximal lateral Left thigh ~ L3-4. Otherwise sensation intact in lower ext. Motor is 5/5 and symmetric throught LE. Normal tone. No clonus at ankles.  Psych: euthymic, appropriate    Data review:   Labs reviewed, notable for:   Lactate 2.3 -> 1.6  Na 131  Bicarb 17, AG 13 AST 45  WBC 14  Hb 14  Hcg neg.  Vaginal wet prep neg   Micro:  Results for orders placed or performed during the hospital encounter of 01/17/24  Wet prep, genital     Status: None   Collection Time: 01/17/24  7:24 PM   Specimen: PATH Cytology Cervicovaginal Ancillary Only  Result Value Ref Range Status   Yeast Wet Prep HPF POC NONE SEEN NONE SEEN Final   Trich, Wet Prep NONE SEEN NONE SEEN Final   Clue Cells Wet Prep HPF POC NONE SEEN NONE SEEN Final   WBC, Wet Prep HPF POC <10 <10 Final   Sperm NONE SEEN  Final    Comment: Performed at [facility], [address] [place], [place] [address]  Resp panel by RT-PCR (RSV, Flu A&B, Covid) Anterior Nasal Swab     Status: None   Collection Time: 01/17/24  7:35 PM   Specimen: Anterior Nasal Swab  Result Value Ref Range Status   SARS Coronavirus 2 by RT PCR NEGATIVE NEGATIVE Final    Comment: (NOTE) SARS-CoV-2 target nucleic acids are NOT DETECTED.  The SARS-CoV-2 RNA is generally detectable in upper respiratory specimens during the acute phase of infection. The lowest concentration of SARS-CoV-2 viral copies this assay can detect is 138 copies/mL. A negative result does not preclude SARS-Cov-2 infection and should not be used as the sole basis for  treatment or other patient management decisions. A negative result may occur with  improper specimen collection/handling, submission of specimen other than nasopharyngeal swab, presence of viral mutation(s) within the areas targeted by this assay, and inadequate number of viral copies(<138 copies/mL). A negative result must be combined with clinical observations, patient history, and epidemiological information. The expected result is Negative.  Fact Sheet for Patients:  BloggerCourse.com  Fact Sheet for Healthcare Providers:  SeriousBroker.it  This test is no t yet approved or cleared by the [place]  FDA and  has been authorized for detection and/or diagnosis of SARS-CoV-2 by FDA under an Emergency Use Authorization (EUA). This  EUA will remain  in effect (meaning this test can be used) for the duration of the COVID-19 declaration under Section 564(b)(1) of the Act, 21 U.S.C.section 360bbb-3(b)(1), unless the authorization is terminated  or revoked sooner.       Influenza A by PCR NEGATIVE NEGATIVE Final   Influenza B by PCR NEGATIVE NEGATIVE Final    Comment: (NOTE) The Xpert Xpress SARS-CoV-2/FLU/RSV plus assay is intended as an aid in the diagnosis of influenza from Nasopharyngeal swab specimens and should not be used as a sole basis for treatment. Nasal washings and aspirates are unacceptable for Xpert Xpress SARS-CoV-2/FLU/RSV testing.  Fact Sheet for Patients: BloggerCourse.com  Fact Sheet for Healthcare Providers: SeriousBroker.it  This test is not yet approved or cleared by the [place]  FDA and has been authorized for detection and/or diagnosis of SARS-CoV-2 by FDA under an Emergency Use Authorization (EUA). This EUA will remain in effect (meaning this test can be used) for the duration of the COVID-19 declaration under Section 564(b)(1) of the Act, 21  U.S.C. section 360bbb-3(b)(1), unless the authorization is terminated or revoked.     Resp Syncytial Virus by PCR NEGATIVE NEGATIVE Final    Comment: (NOTE) Fact Sheet for Patients: BloggerCourse.com  Fact Sheet for Healthcare Providers: SeriousBroker.it  This test is not yet approved or cleared by the [place]  FDA and has been authorized for detection and/or diagnosis of SARS-CoV-2 by FDA under an Emergency Use Authorization (EUA). This EUA will remain in effect (meaning this test can be used) for the duration of the COVID-19 declaration under Section 564(b)(1) of the Act, 21 U.S.C. section 360bbb-3(b)(1), unless the authorization is terminated or revoked.  Performed at [facility], [address], [place], [place] [address]     Imaging reviewed:  CT ABDOMEN PELVIS W CONTRAST Result Date: 01/17/2024 CLINICAL DATA:  Left flank pain and history of UTI, evaluate for possible pyelonephritis EXAM: CT ABDOMEN AND PELVIS WITH CONTRAST TECHNIQUE: Multidetector CT imaging of the abdomen and pelvis was performed using the standard protocol following bolus administration of intravenous contrast. RADIATION DOSE REDUCTION: This exam was performed according to the departmental dose-optimization program which includes automated exposure control, adjustment of the mA and/or kV according to patient size and/or use of iterative reconstruction technique. CONTRAST:  OMNIPAQUE  IOHEXOL  300 MG/ML  SOLN COMPARISON:  01/06/2017 FINDINGS: Lower chest: No acute abnormality. Hepatobiliary: Fatty infiltration of the liver is noted. The gallbladder is within normal limits. Pancreas: Unremarkable. No pancreatic ductal dilatation or surrounding inflammatory changes. Spleen: Normal in size without focal abnormality. Adrenals/Urinary Tract: Adrenal glands are within normal limits. Kidneys demonstrate a normal enhancement pattern bilaterally.  Small exophytic cyst is noted posteriorly in the left kidney. No follow-up is recommended. No renal calculi or obstructive changes are noted. Ureters are within normal limits. The bladder is decompressed by Foley catheter. Stomach/Bowel: No obstructive or inflammatory changes of the colon are noted. Mild diverticular changes seen within the sigmoid colon. No diverticulitis is noted. The appendix has been surgically removed. Small bowel and stomach are within normal limits. Vascular/Lymphatic: No significant vascular findings are present. No enlarged abdominal or pelvic lymph nodes. Reproductive: Uterus and bilateral adnexa are unremarkable. Other: No abdominal wall hernia or abnormality. No abdominopelvic ascites. Musculoskeletal: No acute or significant osseous findings. IMPRESSION: Fatty liver. No findings to suggest pyelonephritis. Diverticular change of the colon. Electronically Signed   By: Oneil Devonshire M.D.   On: 01/17/2024 21:45   DG Chest Port 1 View Result Date:  01/17/2024 CLINICAL DATA:  Sepsis EXAM: PORTABLE CHEST 1 VIEW COMPARISON:  Chest radiograph dated 08/30/2005 FINDINGS: Normal lung volumes. No focal consolidations. No pleural effusion or pneumothorax. The heart size and mediastinal contours are within normal limits. No acute osseous abnormality. IMPRESSION: No acute disease. Electronically Signed   By: Limin  Xu M.D.   On: 01/17/2024 19:20    ED Course:  Treated with 3 L IVF, CTX 2 g IV, pain meds, antiemetics     Assessment/Plan:  43 y.o. female with hx recurrent UTI on suppressive abx, ureteral stricture with hx multiple dilations, lumbar spondylosis, radiculopathy, with hx of recent L5-S1 intracept procedure (intraosseous nerve ablation) in 3/'25, PCOS, mood d/o, who presents with severe L flank and low back pain, with recent treatment for UTI outpatient. Concern for sepsis with unclear source v hypovolemia. Also with urinary retention and L thigh hypothesia more acutely; question  possible OM / discitis / epidural abscess as possibility. And also suspect slow UGIB with recent melena, likely related to NSAID which she was taking for symptomatic relief.   Sepsis, unknown source.  Hypovolemia  Lactic acidosis  Recent OP treatment for UTI  See hx above. On presentation BP 90/70s, HR 120s, Temp borderline 37.8C. WBC 14. Lactate 2.3 -> 1.6 with IVF. UA not impressive for UTI, rare bactereia, no pyuria, trace leukocytes. Will send Cx. CT A/P without acute findings. Only other localizing symptoms / signs appears suggestive of likely gastritis, and possible spinal process like OM / Discitis, epidural abscess.  -- Will transfer ED -> ED to [facility] for expedited MRI of L spine with contrast -- Add vancomycin  pharmacy to dose, continue ceftriaxone  2 g IV 24-hour - Follow-up blood cultures, add on urine culture - S/p 3 L IV fluid with normalization of lactate, continue oral hydration - N.p.o. with sips pending MRI result   Suspect slow UGIB  Likely gastritis, NSAID related  Report intermittent melena x 1 week in setting of NSAID use for symptomatic relief. Hb 14 similar to prior.  - Started on pantoprazole  40 mg IV twice daily - Check FOBT - If more significant bleeding / decline in Hb will need GI involvement, not contacted at this point.    Acute urinary retention  - Foley catheter placed in the ED for urinary retention.  Will need voiding trial, may need intermittent catheterization. - Will need urology evaluation for possible urethral dilation anticipate may be done outpatient  Hyponatremia, likely hypovolemic / low solute  -- Trend with IVF   Chronic medical problems:  lumbar spondylosis, radiculopathy, with hx of recent L5-S1 intracept procedure (intraosseous nerve ablation) in 3/'25,: Noted, advised on avoidance of NSAIDs in setting of likely gastritis PCOS: Holding metformin inpatient Hypothyroidism: Continue home levothyroxine , check TSH Mood disorder: Continue home  lithium , clonazepam  prn. Hold propranolol  in setting of low BP   Body mass index is 39.62 kg/m.  Obesity class II, would benefit from weight loss outpatient  DVT prophylaxis:  SCDs Code Status:  Full Code Diet:  Diet Orders (From admission, onward)     Start     Ordered   01/17/24 2236  Diet regular Room service appropriate? Yes; Fluid consistency: Thin  Diet effective now       Question Answer Comment  Room service appropriate? Yes   Fluid consistency: Thin      01/17/24 2236           Family Communication:  None   Consults:  None   Admission status:  Inpatient, Telemetry bed  Severity of Illness: The appropriate patient status for this patient is INPATIENT. Inpatient status is judged to be reasonable and necessary in order to provide the required intensity of service to ensure the patient's safety. The patient's presenting symptoms, physical exam findings, and initial radiographic and laboratory data in the context of their chronic comorbidities is felt to place them at high risk for further clinical deterioration. Furthermore, it is not anticipated that the patient will be medically stable for discharge from the hospital within 2 midnights of admission.   * I certify that at the point of admission it is my clinical judgment that the patient will require inpatient hospital care spanning beyond 2 midnights from the point of admission due to high intensity of service, high risk for further deterioration and high frequency of surveillance required.*   Dorn Dawson, MD [facility]  How to contact the [facility] Attending or Consulting provider 7A - 7P or covering provider during after hours 7P -7A, for this patient.  Check the care team in [facility] and look for a) attending/consulting [facility] provider listed and b) the [facility] team listed Log into [email or web address] and use [facility] universal password to access. If you do not have the password, please contact the hospital  operator. Locate the [facility] provider you are looking for under [facility] and page to a number that you can be directly reached. If you still have difficulty reaching the provider, please page the [facility] (Director on Call) for the Hospitalists listed on amion for assistance.  01/17/2024, 11:01 PM

## 2024-01-17 NOTE — ED Notes (Signed)
 Patient transported to CT

## 2024-01-17 NOTE — ED Provider Notes (Signed)
 [place] EMERGENCY DEPARTMENT AT [facility] Provider Note   CSN: [id number] Arrival date & time: 01/17/24  1626     Patient presents with: Flank Pain and Urinary Retention   Amanda Rogers is a 43 y.o. female.   The history is provided by the patient and medical records. No language interpreter was used.  Flank Pain     Amanda Rogers is a 43 y.o. female with PMH significant for chronic urinary tract infections, PCOS, IBS, and colon polyps who presents today with CC of worsening abdominal and flank pain following a UTI. Patient reports she was diagnosed with a UTI 3 weeks ago. She was prescribed cipro and finished her abx yesterday. Patient is on daily Macrobid for chronic UTIs. Patient states the pain and UTI symptoms have gotten progressively worse over the course of her cipro so she was instructed to come here today. Patient reports severe bilateral lower abdominal/pelvic pain and severe left sided flank pain, urinary retention since 1am, bloating, and associated nausea and vomiting. She has not had a fever but endorses feeling flu-like. Additionally, she endorses dark stool for the past 1 week with a few episodes of diarrhea. Patient has a history of IBS and has colon polyps removed in the past as well as a prior appendectomy and the removal of multiple ovarian cysts.   Prior to Admission medications   Medication Sig Start Date End Date Taking? Authorizing Provider  clonazePAM  (KLONOPIN ) 1 MG tablet Take 1/2-1 tab po TID prn anxiety 05/28/23   Franchot Harlene SQUIBB, PMHNP  Dextromethorphan -buPROPion  ER (AUVELITY ) 45-105 MG TBCR Take 1 tablet by mouth 2 (two) times daily. 06/11/23 09/09/23  Franchot Harlene SQUIBB, PMHNP  FLUoxetine  (PROZAC ) 40 MG capsule Take 1 capsule (40 mg total) by mouth daily. 06/11/23 12/08/23  Franchot Harlene SQUIBB, PMHNP  levothyroxine  (SYNTHROID ) 50 MCG tablet Take 50 mcg by mouth daily before breakfast. Patient not taking: Reported on 06/11/2023 03/29/22    [provider]  LINZESS 145 MCG CAPS capsule Take 145 mcg by mouth daily as needed. Patient not taking: Reported on 06/11/2023 02/06/22   [provider]  lithium  carbonate 150 MG capsule Take 3 capsules (450 mg total) by mouth at bedtime. 06/11/23 09/09/23  Franchot Harlene SQUIBB, PMHNP  metFORMIN (GLUCOPHAGE-XR) 500 MG 24 hr tablet Take 500 mg by mouth daily with breakfast. 05/08/22   [provider]  nitrofurantoin (MACRODANTIN) 100 MG capsule Take 100 mg by mouth daily. 04/18/23   [provider]  polyethylene glycol (MIRALAX  / GLYCOLAX ) 17 g packet Take 17 g by mouth daily as needed. Patient not taking: Reported on 10/09/2022    [provider]  propranolol  ER (INDERAL  LA) 60 MG 24 hr capsule Take 1 capsule (60 mg total) by mouth 2 (two) times daily. 02/08/23 05/09/23  Franchot Harlene SQUIBB, PMHNP  VITAMIN D PO Take 5,000 Int'l Units/day by mouth. Patient not taking: Reported on 08/17/2021    [provider]    Allergies: Ultram [tramadol]    Review of Systems  Genitourinary:  Positive for flank pain.  All other systems reviewed and are negative.   Updated Vital Signs BP 93/71 (BP Location: Left Arm)   Pulse (!) 121   Temp 98 F (36.7 C)   Resp 20   Ht 5' 5 (1.651 m)   Wt 108 kg   SpO2 100%   BMI 39.62 kg/m   Physical Exam Vitals and nursing note reviewed.  Constitutional:  General: She is not in acute distress.    Appearance: She is well-developed. She is obese.     Comments: Patient sitting in bed appears uncomfortable  HENT:     Head: Atraumatic.  Eyes:     Conjunctiva/sclera: Conjunctivae normal.  Cardiovascular:     Rate and Rhythm: Tachycardia present.     Pulses: Normal pulses.     Heart sounds: Normal heart sounds.  Pulmonary:     Effort: Pulmonary effort is normal.  Abdominal:     General: There is distension.     Tenderness: There is abdominal tenderness. There is right CVA tenderness and left CVA  tenderness.  Musculoskeletal:     Cervical back: Neck supple.  Skin:    Findings: No rash.  Neurological:     Mental Status: She is alert.  Psychiatric:        Mood and Affect: Mood normal.     (all labs ordered are listed, but only abnormal results are displayed) Labs Reviewed  COMPREHENSIVE METABOLIC PANEL WITH GFR - Abnormal; Notable for the following components:      Result Value   Sodium 131 (*)    CO2 17 (*)    AST 45 (*)    All other components within normal limits  CBC WITH DIFFERENTIAL/PLATELET - Abnormal; Notable for the following components:   WBC 14.9 (*)    Neutro Abs 9.6 (*)    Lymphs Abs 4.1 (*)    All other components within normal limits  URINALYSIS, ROUTINE W REFLEX MICROSCOPIC - Abnormal; Notable for the following components:   Specific Gravity, Urine 1.002 (*)    Leukocytes,Ua TRACE (*)    Bacteria, UA RARE (*)    All other components within normal limits  I-STAT CG4 LACTIC ACID, ED - Abnormal; Notable for the following components:   Lactic Acid, Venous 2.3 (*)    All other components within normal limits  WET PREP, GENITAL  RESP PANEL BY RT-PCR (RSV, FLU A&B, COVID)  RVPGX2  CULTURE, BLOOD (ROUTINE X 2)  CULTURE, BLOOD (ROUTINE X 2)  LIPASE, BLOOD  HCG, SERUM, QUALITATIVE  PROTIME-INR  RPR  I-STAT CG4 LACTIC ACID, ED  I-STAT CG4 LACTIC ACID, ED  GC/CHLAMYDIA PROBE AMP ([employer]) NOT AT [facility]    EKG: None  Radiology: CT ABDOMEN PELVIS W CONTRAST Result Date: 01/17/2024 CLINICAL DATA:  Left flank pain and history of UTI, evaluate for possible pyelonephritis EXAM: CT ABDOMEN AND PELVIS WITH CONTRAST TECHNIQUE: Multidetector CT imaging of the abdomen and pelvis was performed using the standard protocol following bolus administration of intravenous contrast. RADIATION DOSE REDUCTION: This exam was performed according to the departmental dose-optimization program which includes automated exposure control, adjustment of the mA and/or kV according to  patient size and/or use of iterative reconstruction technique. CONTRAST:  OMNIPAQUE  IOHEXOL  300 MG/ML  SOLN COMPARISON:  01/06/2017 FINDINGS: Lower chest: No acute abnormality. Hepatobiliary: Fatty infiltration of the liver is noted. The gallbladder is within normal limits. Pancreas: Unremarkable. No pancreatic ductal dilatation or surrounding inflammatory changes. Spleen: Normal in size without focal abnormality. Adrenals/Urinary Tract: Adrenal glands are within normal limits. Kidneys demonstrate a normal enhancement pattern bilaterally. Small exophytic cyst is noted posteriorly in the left kidney. No follow-up is recommended. No renal calculi or obstructive changes are noted. Ureters are within normal limits. The bladder is decompressed by Foley catheter. Stomach/Bowel: No obstructive or inflammatory changes of the colon are noted. Mild diverticular changes seen within the sigmoid colon. No diverticulitis is noted.  The appendix has been surgically removed. Small bowel and stomach are within normal limits. Vascular/Lymphatic: No significant vascular findings are present. No enlarged abdominal or pelvic lymph nodes. Reproductive: Uterus and bilateral adnexa are unremarkable. Other: No abdominal wall hernia or abnormality. No abdominopelvic ascites. Musculoskeletal: No acute or significant osseous findings. IMPRESSION: Fatty liver. No findings to suggest pyelonephritis. Diverticular change of the colon. Electronically Signed   By: Oneil Devonshire M.D.   On: 01/17/2024 21:45   DG Chest Port 1 View Result Date: 01/17/2024 CLINICAL DATA:  Sepsis EXAM: PORTABLE CHEST 1 VIEW COMPARISON:  Chest radiograph dated 08/30/2005 FINDINGS: Normal lung volumes. No focal consolidations. No pleural effusion or pneumothorax. The heart size and mediastinal contours are within normal limits. No acute osseous abnormality. IMPRESSION: No acute disease. Electronically Signed   By: Limin  Xu M.D.   On: 01/17/2024 19:20     .Pelvic  exam  Date/Time: 01/17/2024 7:12 PM  Performed by: Nivia Colon, PA-C Authorized by: Nivia Colon, PA-C  Comments: Chaperone present during exam.  No inguinal lymphadenopathy or inguinal hernia noted.  Normal external genitalia.  No significant discomfort with speculum insertion.  Closed cervical os free of lesion or rash.  No vaginal tear no blood in the vaginal vault.  On bimanual exam mild left adnexal tenderness without cervical motion tenderness.   .Critical Care  Performed by: Nivia Colon, PA-C Authorized by: Nivia Colon, PA-C   Critical care provider statement:    Critical care time (minutes):  30   Critical care was time spent personally by me on the following activities:  Development of treatment plan with patient or surrogate, discussions with consultants, evaluation of patient's response to treatment, examination of patient, ordering and review of laboratory studies, ordering and review of radiographic studies, ordering and performing treatments and interventions, pulse oximetry, re-evaluation of patient's condition and review of old charts    Medications Ordered in the ED  lactated ringers  infusion (has no administration in time range)  lactated ringers  bolus 1,000 mL (1,000 mLs Intravenous New Bag/Given 01/17/24 2101)    And  lactated ringers  bolus 1,000 mL (1,000 mLs Intravenous New Bag/Given 01/17/24 2101)    And  lactated ringers  bolus 500 mL (has no administration in time range)  promethazine  (PHENERGAN ) 12.5 mg in sodium chloride  0.9 % 50 mL IVPB (has no administration in time range)  morphine  (PF) 4 MG/ML injection 4 mg (has no administration in time range)  morphine  (PF) 4 MG/ML injection 4 mg (4 mg Intravenous Given 01/17/24 1844)  ondansetron  (ZOFRAN ) injection 4 mg (4 mg Intravenous Given 01/17/24 1844)  sodium chloride  0.9 % bolus 1,000 mL (0 mLs Intravenous Stopped 01/17/24 2015)  cefTRIAXone  (ROCEPHIN ) 2 g in sodium chloride  0.9 % 100 mL IVPB (0 g Intravenous Stopped  01/17/24 2015)  acetaminophen  (TYLENOL ) tablet 1,000 mg (1,000 mg Oral Given 01/17/24 2103)  iohexol  (OMNIPAQUE ) 300 MG/ML solution 100 mL (100 mLs Intravenous Contrast Given 01/17/24 2121)  ondansetron  (ZOFRAN ) injection 4 mg (4 mg Intravenous Given 01/17/24 2102)                                    Medical Decision Making Amount and/or Complexity of Data Reviewed Labs: ordered. Radiology: ordered.  Risk OTC drugs. Prescription drug management.   BP 93/71 (BP Location: Left Arm)   Pulse (!) 121   Temp 98 F (36.7 C)   Resp 20   Ht 5'  5 (1.651 m)   Wt 108 kg   SpO2 100%   BMI 39.62 kg/m   5:53 PM Amanda Rogers is a 43 y.o. female with PMH significant for chronic urinary tract infections, PCOS, IBS, and colon polyps who presents today with CC of worsening abdominal and flank pain following a UTI. Patient reports she was diagnosed with a UTI 3 weeks ago. She was prescribed cipro and finished her abx yesterday. Patient is on daily Macrobid for chronic UTIs. Patient states the pain and UTI symptoms have gotten progressively worse over the course of her cipro so she was instructed to come here today. Patient reports severe bilateral lower abdominal/pelvic pain and severe left sided flank pain, urinary retention since 1am, bloating, and associated nausea and vomiting. She has not had a fever but endorses feeling flu-like. Additionally, she endorses dark stool for the past 1 week with a few episodes of diarrhea. Patient has a history of IBS and has colon polyps removed in the past as well as a prior appendectomy and the removal of multiple ovarian cysts.   On exam patient is sitting in bed appears uncomfortable.  She is not tachycardic, her abdomen is distended with significant tenderness to the suprapubic region and some bilateral CVA tenderness on percussion.  Her lungs are clear.  Vital sign notable for for soft blood pressure of 93/71, her heart rate is elevated at 121.  She is  afebrile no hypoxia.  Bladder scan performed showing retained urine greater than 700cc, will insert Foley catheter.  -Labs ordered, independently viewed and interpreted by me.  Labs remarkable for elevated white count of 14.9, elevated lactic acid of 2.33 coupled with his high blood pressure and tachycardia patient meets sepsis criteria.  Code sepsis initiated, will provide fluid resuscitation at 30 mL/kg and will give Rocephin  antibiotic. -The patient was maintained on a cardiac monitor.  I personally viewed and interpreted the cardiac monitored which showed an underlying rhythm of: sinus tachycardia -Imaging independently viewed and interpreted by me and I agree with radiologist's interpretation.  Result remarkable for CT of abd/pelvis without acute changes. CXR unremarkable -This patient presents to the ED for concern of abd pain, this involves an extensive number of treatment options, and is a complaint that carries with it a high risk of complications and morbidity.  The differential diagnosis includes sepsis, UTI, pyelonephritis, appendicitis, colitis, diverticulitis, kidney stone, pancreatitis -Co morbidities that complicate the patient evaluation includes chronic UTI, PCOS, IBS -Treatment includes rocephin , IVF, sepsis protocol, zofran , phenergan , morphine  -Reevaluation of the patient after these medicines showed that the patient improved -PCP office notes or outside notes reviewed -Discussion with specialist [facility] Dr. Keturah who agrees to admit pt -Escalation to admission/observation considered: patient is agreeable with admission.      Final diagnoses:  Sepsis, due to unspecified organism, unspecified whether acute organ dysfunction present [facility])  Urinary retention    ED Discharge Orders     None          Nivia Colon, PA-C 01/17/24 2210    Albertina Dixon, MD 01/18/24 [phone]

## 2024-01-17 NOTE — Progress Notes (Signed)
 Pharmacy Antibiotic Note  Amanda Rogers is a 43 y.o. female admitted on 01/17/2024 with sepsis.  Recently treated for UTI.  Presents with c/o abdominal and flank pain.  Patient received Ceftriaxone  x 1 dose in ED.  Pharmacy has been consulted for Vancomycin  dosing.  Plan: Vancomycin  2g IV x 1 followed by Vancomycin  1750 mg IV Q 24 hrs. Goal AUC 400-550. Expected AUC: 527.7  SCr used: 0.96 Ceftriaxone  per MD Follow renal function F/u culture results & sensitivities  Height: 5' 5 (165.1 cm) Weight: 108 kg (238 lb 1.6 oz) IBW/kg (Calculated) : 57  Temp (24hrs), Avg:99 F (37.2 C), Min:98 F (36.7 C), Max:100 F (37.8 C)  Recent Labs  Lab 01/17/24 1639 01/17/24 1849 01/17/24 2104  WBC 14.9*  --   --   CREATININE 0.96  --   --   LATICACIDVEN  --  2.3* 1.6    Estimated Creatinine Clearance: 93.3 mL/min (by C-G formula based on SCr of 0.96 mg/dL).    Allergies  Allergen Reactions   Ultram [Tramadol] Rash    Antimicrobials this admission: 7/31 Ceftriaxone  >>   7/31 Vancomycin  >>    Dose adjustments this admission:    Microbiology results: 7/31 BCx:   7/31 UCx:       Thank you for allowing pharmacy to be a part of this patient's care.  Kemp Arvin Fletcher, PharmD 01/17/2024 10:54 PM

## 2024-01-17 NOTE — Sepsis Progress Note (Signed)
 Elink monitoring for the code sepsis protocol.

## 2024-01-18 ENCOUNTER — Inpatient Hospital Stay (HOSPITAL_COMMUNITY): Payer: PRIVATE HEALTH INSURANCE

## 2024-01-18 DIAGNOSIS — A419 Sepsis, unspecified organism: Secondary | ICD-10-CM | POA: Diagnosis not present

## 2024-01-18 LAB — BASIC METABOLIC PANEL WITH GFR
Anion gap: 11 (ref 5–15)
BUN: 7 mg/dL (ref 6–20)
CO2: 18 mmol/L — ABNORMAL LOW (ref 22–32)
Calcium: 7.7 mg/dL — ABNORMAL LOW (ref 8.9–10.3)
Chloride: 107 mmol/L (ref 98–111)
Creatinine, Ser: 0.91 mg/dL (ref 0.44–1.00)
GFR, Estimated: >60 mL/min (ref >60)
Glucose, Bld: 93 mg/dL (ref 70–99)
Potassium: 4.1 mmol/L (ref 3.5–5.1)
Sodium: 136 mmol/L (ref 135–145)

## 2024-01-18 LAB — TSH: TSH: 10.285 u[IU]/mL — ABNORMAL HIGH (ref 0.350–4.500)

## 2024-01-18 LAB — GC/CHLAMYDIA PROBE AMP (~~LOC~~) NOT AT ARMC
Chlamydia: NEGATIVE
Comment: NEGATIVE
Comment: NORMAL
Neisseria Gonorrhea: NEGATIVE

## 2024-01-18 LAB — PHOSPHORUS: Phosphorus: 4 mg/dL (ref 2.5–4.6)

## 2024-01-18 LAB — HIV ANTIBODY (ROUTINE TESTING W REFLEX): HIV Screen 4th Generation wRfx: NONREACTIVE

## 2024-01-18 LAB — CBC
HCT: 38 % (ref 36.0–46.0)
Hemoglobin: 12.7 g/dL (ref 12.0–15.0)
MCH: 32.4 pg (ref 26.0–34.0)
MCHC: 33.4 g/dL (ref 30.0–36.0)
MCV: 96.9 fL (ref 80.0–100.0)
Platelets: 210 K/uL (ref 150–400)
RBC: 3.92 MIL/uL (ref 3.87–5.11)
RDW: 13.2 % (ref 11.5–15.5)
WBC: 10.1 K/uL (ref 4.0–10.5)
nRBC: 0 % (ref 0.0–0.2)

## 2024-01-18 LAB — C-REACTIVE PROTEIN: CRP: 4.6 mg/dL — ABNORMAL HIGH (ref <1.0)

## 2024-01-18 LAB — MAGNESIUM: Magnesium: 1.8 mg/dL (ref 1.7–2.4)

## 2024-01-18 LAB — RPR: RPR Ser Ql: NONREACTIVE

## 2024-01-18 LAB — SEDIMENTATION RATE: Sed Rate: 9 mm/h (ref 0–22)

## 2024-01-18 MED ORDER — PROCHLORPERAZINE EDISYLATE 10 MG/2ML IJ SOLN
10.0000 mg | Freq: Four times a day (QID) | INTRAMUSCULAR | Status: AC | PRN
  Administered 2024-01-18 (×2): 10 mg via INTRAVENOUS
  Filled 2024-01-18 (×2): qty 2

## 2024-01-18 MED ORDER — CHLORHEXIDINE GLUCONATE CLOTH 2 % EX PADS
6.0000 | MEDICATED_PAD | Freq: Every day | CUTANEOUS | Status: DC
  Administered 2024-01-18 – 2024-01-22 (×5): 6 via TOPICAL

## 2024-01-18 MED ORDER — PROCHLORPERAZINE EDISYLATE 10 MG/2ML IJ SOLN
10.0000 mg | Freq: Four times a day (QID) | INTRAMUSCULAR | Status: AC | PRN
  Administered 2024-01-19: 10 mg via INTRAVENOUS
  Filled 2024-01-18: qty 2

## 2024-01-18 MED ORDER — GADOBUTROL 1 MMOL/ML IV SOLN
10.0000 mL | Freq: Once | INTRAVENOUS | Status: AC | PRN
  Administered 2024-01-18: 10 mL via INTRAVENOUS

## 2024-01-18 MED ORDER — SODIUM CHLORIDE 0.9 % IV SOLN: INTRAVENOUS | Status: AC

## 2024-01-18 NOTE — Plan of Care (Signed)
   Problem: Education: Goal: Knowledge of General Education information will improve Description Including pain rating scale, medication(s)/side effects and non-pharmacologic comfort measures Outcome: Progressing

## 2024-01-18 NOTE — Progress Notes (Signed)
 PROGRESS NOTE    Amanda Rogers  FMW:[id number] DOB: 1980/06/24 DOA: 01/17/2024 PCP: Gladystine Erminio CROME, MD   Brief Narrative:  This 43 yrs old female with PMH significant for recurrent UTI on suppressive abx, ureteral stricture with hx multiple dilations, lumbar spondylosis, radiculopathy, with hx of recent L5-S1 intracept procedure (intraosseous nerve ablation) in 3/25, PCOS, mood d/o, who presents with severe L flank and lower back pain. She reports her symptoms have been intermittent over the past 3 weeks. She describes  stabing / sharp pain in the L groin mainly. Over time has become more severe esp in the past day. She relates symptoms similar to prior UTI symptoms. Saw her PCP and was Rx'd for Ciprofloxacin for 7 day course which she finished yesterday. Despite Abx symptoms have persisted. Also has worsened midline low back pain, left upper and lateral thigh hypoesthesia, and urinary retention x 1 day. No weakness in her lower extremities. No incontinence. Otherwise has decreased PO intake, nausea, and vomiting with bilious emesis x ~ 1 week. And over past week has noted intermittent dark stools. Has been taking NSAIDs for relief of her pain. No other AC / AntiPLT.  Patient was admitted for further evaluation.  Assessment & Plan:   Principal Problem:   Sepsis (HCC) Active Problems:   Hypovolemia   Midline low back pain   Acute urinary retention   Sepsis with unknown source: Lactic acidosis  Partially treated UTI: She presented with hypotension, tachycardia, lactic acidosis, leukocytosis. She has recently completed course of ciprofloxacin for UTI. UA not impressive for UTI, rare bactereia, no pyuria, trace leukocytes. Follow up cultures.   CT A/P without acute findings.  Lactic acid improved with IV hydration.  Blood pressure improved.   Could be spinal process like OM / Discitis, epidural abscess.  Obtain MRI of L-spine without/ with contrast. Continue ceftriaxone  and  vancomycin . Follow-up blood cultures, add on urine culture S/p 3 L IV fluid with normalization of lactate, continue oral hydration Continue to monitor.   Suspect slow UGIB:  Likely gastritis, NSAID related  She reports intermittent melena x 1 week in setting of NSAID use for symptomatic relief.   Hb remains stable above 14. Continue pantoprazole  40 mg IV twice daily. Check FOBT. If more significant bleeding / decline in Hb,  will need GI involvement.   Acute urinary retention : Foley catheter placed in the ED for urinary retention.   Will need voiding trial, may need intermittent catheterization. Will need urology evaluation for possible urethral dilation, anticipate may be done outpatient   Hyponatremia, likely hypovolemic / low solute. Continue to monitor with IV hydration.   lumbar spondylosis, radiculopathy: hx of recent L5-S1 intracept procedure (intraosseous nerve ablation) in 3/'25,:  Noted, advised on avoidance of NSAIDs in setting of likely gastritis.  PCOS:  Hold metformin.  Hypothyroidism:  Continue levothyroxine .  Mood disorder:  Continue home lithium , clonazepam  prn.  Hold propranolol  in setting of low BP    DVT prophylaxis: SCDs Code Status:Full code Family Communication: No family at bed side Disposition Plan:    Status is: Inpatient Remains inpatient appropriate because: Severity of illness.   Consultants:  None  Procedures: MRI L spine  Antimicrobials:  Anti-infectives (From admission, onward)    Start     Dose/Rate Route Frequency Ordered Stop   01/19/24 0000  vancomycin  (VANCOREADY) IVPB 1750 mg/350 mL        1,750 mg 175 mL/hr over 120 Minutes Intravenous Every 24 hours 01/17/24 2258  01/18/24 1900  cefTRIAXone  (ROCEPHIN ) 2 g in sodium chloride  0.9 % 100 mL IVPB        2 g 200 mL/hr over 30 Minutes Intravenous Every 24 hours 01/17/24 2233     01/17/24 2300  vancomycin  (VANCOREADY) IVPB 2000 mg/400 mL        2,000 mg 200 mL/hr over  120 Minutes Intravenous  Once 01/17/24 2251 01/18/24 0337   01/17/24 1900  cefTRIAXone  (ROCEPHIN ) 2 g in sodium chloride  0.9 % 100 mL IVPB        2 g 200 mL/hr over 30 Minutes Intravenous Once 01/17/24 1856 01/17/24 2015      Subjective: Patient was seen and examined at bedside.  Overnight events noted. Patient reports doing much better, She still reporting significant pain in the back.  MRI is pending.  Objective: Vitals:   01/17/24 2358 01/18/24 0143 01/18/24 0624 01/18/24 0735  BP:  121/67 108/61 99/65  Pulse:  86 99 94  Resp:  18 18 16   Temp: 98.4 F (36.9 C) 98.4 F (36.9 C) 98 F (36.7 C) 98.8 F (37.1 C)  TempSrc: Oral Oral Oral Oral  SpO2:  96% 94% 95%  Weight:  104.9 kg    Height:        Intake/Output Summary (Last 24 hours) at 01/18/2024 1123 Last data filed at 01/18/2024 9367 Gross per 24 hour  Intake 1509.95 ml  Output 1850 ml  Net -340.05 ml   Filed Weights   01/17/24 1652 01/18/24 0143  Weight: 108 kg 104.9 kg    Examination:  General exam: Appears calm and comfortable, not in any acute distress. Respiratory system: Clear to auscultation. Respiratory effort normal.  RR 16 Cardiovascular system: S1 & S2 heard, RRR. No JVD, murmurs, rubs, gallops or clicks.  Gastrointestinal system: Abdomen is non distended, soft and non tender. Normal bowel sounds heard. Central nervous system: Alert and oriented x 3.  Hypoesthesia in anterior proximal lateral left thigh. Extremities: No edema, no cyanosis, no clubbing. Skin: No rashes, lesions or ulcers Psychiatry: Judgement and insight appear normal. Mood & affect appropriate.     Data Reviewed: I have personally reviewed following labs and imaging studies  CBC: Recent Labs  Lab 01/17/24 1639 01/18/24 0727  WBC 14.9* 10.1  NEUTROABS 9.6*  --   HGB 14.9 12.7  HCT 44.7 38.0  MCV 95.1 96.9  PLT 290 210   Basic Metabolic Panel: Recent Labs  Lab 01/17/24 1639 01/18/24 0727  NA 131* 136  K 3.9 4.1  CL 101  107  CO2 17* 18*  GLUCOSE 99 93  BUN 8 7  CREATININE 0.96 0.91  CALCIUM 9.0 7.7*  MG  --  1.8  PHOS  --  4.0   GFR: Estimated Creatinine Clearance: 96.9 mL/min (by C-G formula based on SCr of 0.91 mg/dL). Liver Function Tests: Recent Labs  Lab 01/17/24 1639  AST 45*  ALT 43  ALKPHOS 51  BILITOT 0.6  PROT 8.1  ALBUMIN 3.9   Recent Labs  Lab 01/17/24 1639  LIPASE 31   No results for input(s): AMMONIA in the last 168 hours. Coagulation Profile: Recent Labs  Lab 01/17/24 1917  INR 1.0   Cardiac Enzymes: No results for input(s): CKTOTAL, CKMB, CKMBINDEX, TROPONINI in the last 168 hours. BNP (last 3 results) No results for input(s): PROBNP in the last 8760 hours. HbA1C: No results for input(s): HGBA1C in the last 72 hours. CBG: No results for input(s): GLUCAP in the last 168 hours. Lipid  Profile: No results for input(s): CHOL, HDL, LDLCALC, TRIG, CHOLHDL, LDLDIRECT in the last 72 hours. Thyroid Function Tests: Recent Labs    01/18/24 0727  TSH 10.285*   Anemia Panel: No results for input(s): VITAMINB12, FOLATE, FERRITIN, TIBC, IRON, RETICCTPCT in the last 72 hours. Sepsis Labs: Recent Labs  Lab 01/17/24 1849 01/17/24 2104  LATICACIDVEN 2.3* 1.6    Recent Results (from the past 240 hours)  Blood culture (routine x 2)     Status: None (Preliminary result)   Collection Time: 01/17/24  6:40 PM   Specimen: BLOOD LEFT WRIST  Result Value Ref Range Status   Specimen Description   Final    BLOOD LEFT WRIST Performed at [facility], [address] [place], [place] [address]    Special Requests   Final    BOTTLES DRAWN AEROBIC AND ANAEROBIC Blood Culture adequate volume Performed at [facility], [address] [place], [place] [address]    Culture   Final    NO GROWTH < 12 HOURS Performed at [facility], [address] [place], [place] [address]    Report Status PENDING   Incomplete  Wet prep, genital     Status: None   Collection Time: 01/17/24  7:24 PM   Specimen: PATH Cytology Cervicovaginal Ancillary Only  Result Value Ref Range Status   Yeast Wet Prep HPF POC NONE SEEN NONE SEEN Final   Trich, Wet Prep NONE SEEN NONE SEEN Final   Clue Cells Wet Prep HPF POC NONE SEEN NONE SEEN Final   WBC, Wet Prep HPF POC <10 <10 Final   Sperm NONE SEEN  Final    Comment: Performed at [facility], [address] [place], [place] [address]  Resp panel by RT-PCR (RSV, Flu A&B, Covid) Anterior Nasal Swab     Status: None   Collection Time: 01/17/24  7:35 PM   Specimen: Anterior Nasal Swab  Result Value Ref Range Status   SARS Coronavirus 2 by RT PCR NEGATIVE NEGATIVE Final    Comment: (NOTE) SARS-CoV-2 target nucleic acids are NOT DETECTED.  The SARS-CoV-2 RNA is generally detectable in upper respiratory specimens during the acute phase of infection. The lowest concentration of SARS-CoV-2 viral copies this assay can detect is 138 copies/mL. A negative result does not preclude SARS-Cov-2 infection and should not be used as the sole basis for treatment or other patient management decisions. A negative result may occur with  improper specimen collection/handling, submission of specimen other than nasopharyngeal swab, presence of viral mutation(s) within the areas targeted by this assay, and inadequate number of viral copies(<138 copies/mL). A negative result must be combined with clinical observations, patient history, and epidemiological information. The expected result is Negative.  Fact Sheet for Patients:  BloggerCourse.com  Fact Sheet for Healthcare Providers:  SeriousBroker.it  This test is no t yet approved or cleared by the [place]  FDA and  has been authorized for detection and/or diagnosis of SARS-CoV-2 by FDA under an Emergency Use Authorization (EUA). This EUA will remain   in effect (meaning this test can be used) for the duration of the COVID-19 declaration under Section 564(b)(1) of the Act, 21 U.S.C.section 360bbb-3(b)(1), unless the authorization is terminated  or revoked sooner.       Influenza A by PCR NEGATIVE NEGATIVE Final   Influenza B by PCR NEGATIVE NEGATIVE Final    Comment: (NOTE) The Xpert Xpress SARS-CoV-2/FLU/RSV plus assay is intended as an aid in  the diagnosis of influenza from Nasopharyngeal swab specimens and should not be used as a sole basis for treatment. Nasal washings and aspirates are unacceptable for Xpert Xpress SARS-CoV-2/FLU/RSV testing.  Fact Sheet for Patients: BloggerCourse.com  Fact Sheet for Healthcare Providers: SeriousBroker.it  This test is not yet approved or cleared by the [place]  FDA and has been authorized for detection and/or diagnosis of SARS-CoV-2 by FDA under an Emergency Use Authorization (EUA). This EUA will remain in effect (meaning this test can be used) for the duration of the COVID-19 declaration under Section 564(b)(1) of the Act, 21 U.S.C. section 360bbb-3(b)(1), unless the authorization is terminated or revoked.     Resp Syncytial Virus by PCR NEGATIVE NEGATIVE Final    Comment: (NOTE) Fact Sheet for Patients: BloggerCourse.com  Fact Sheet for Healthcare Providers: SeriousBroker.it  This test is not yet approved or cleared by the [place]  FDA and has been authorized for detection and/or diagnosis of SARS-CoV-2 by FDA under an Emergency Use Authorization (EUA). This EUA will remain in effect (meaning this test can be used) for the duration of the COVID-19 declaration under Section 564(b)(1) of the Act, 21 U.S.C. section 360bbb-3(b)(1), unless the authorization is terminated or revoked.  Performed at [facility], [address] [place], [place]  [address]      Radiology Studies: MR Lumbar Spine W Wo Contrast Result Date: 01/18/2024 CLINICAL DATA:  43 year old female with left flank pain, UTI. EXAM: MRI LUMBAR SPINE WITHOUT AND WITH CONTRAST TECHNIQUE: Multiplanar and multiecho pulse sequences of the lumbar spine were obtained without and with intravenous contrast. CONTRAST:  10mL GADAVIST GADOBUTROL 1 MMOL/ML IV SOLN COMPARISON:  CT Abdomen and Pelvis yesterday. Lumbar MRI 08/24/2005. FINDINGS: Segmentation:  Normal on the CT yesterday. Alignment: Similar lumbar lordosis to the 2007 MRI. Mild chronic retrolisthesis of L5 on S1. No significant scoliosis. Vertebrae: Normal background bone marrow signal. Maintained vertebral height and alignment. Small benign L5 and S1 vertebral hemangiomas, correlated on CT axial and sagittal images yesterday (series 6, images 9 and 10). No marrow edema or suspicious osseous lesion identified. Intact visible sacral ala. Conus medullaris and cauda equina: Conus extends to the L1 level. No lower spinal cord or conus signal abnormality. No abnormal intradural enhancement. Normal spinal canal patency. Unremarkable cauda equina nerve roots. Paraspinal and other soft tissues: Negative. No paraspinal inflammation or fluid collection. Disc levels: Normal for age intervertebral disc signal and morphology from T12-L1 through L4-L5. Mild superimposed facet degeneration and hypertrophy at those levels. But no associated stenosis. L5-S1: Moderate to severe disc space loss with vacuum disc by CT yesterday. Circumferential disc osteophyte complex with a broad-based posterior component. Mild to moderate facet hypertrophy. But no spinal or lateral recess stenosis. Moderate left L5 neural foraminal stenosis. Mild to moderate right L5 foraminal stenosis. IMPRESSION: 1. No evidence of lumbar spinal infection, no acute or inflammatory process identified. 2. Advanced chronic disc and endplate degeneration at L5-S1 with moderate L5 nerve level  foraminal stenosis greater on the left. 3. Mild for age lumbar spine degeneration otherwise. No spinal stenosis. Electronically Signed   By: VEAR Hurst M.D.   On: 01/18/2024 04:46   CT ABDOMEN PELVIS W CONTRAST Result Date: 01/17/2024 CLINICAL DATA:  Left flank pain and history of UTI, evaluate for possible pyelonephritis EXAM: CT ABDOMEN AND PELVIS WITH CONTRAST TECHNIQUE: Multidetector CT imaging of the abdomen and pelvis was performed using the standard protocol following bolus administration of intravenous contrast. RADIATION DOSE REDUCTION: This exam was performed  according to the departmental dose-optimization program which includes automated exposure control, adjustment of the mA and/or kV according to patient size and/or use of iterative reconstruction technique. CONTRAST:  OMNIPAQUE  IOHEXOL  300 MG/ML  SOLN COMPARISON:  01/06/2017 FINDINGS: Lower chest: No acute abnormality. Hepatobiliary: Fatty infiltration of the liver is noted. The gallbladder is within normal limits. Pancreas: Unremarkable. No pancreatic ductal dilatation or surrounding inflammatory changes. Spleen: Normal in size without focal abnormality. Adrenals/Urinary Tract: Adrenal glands are within normal limits. Kidneys demonstrate a normal enhancement pattern bilaterally. Small exophytic cyst is noted posteriorly in the left kidney. No follow-up is recommended. No renal calculi or obstructive changes are noted. Ureters are within normal limits. The bladder is decompressed by Foley catheter. Stomach/Bowel: No obstructive or inflammatory changes of the colon are noted. Mild diverticular changes seen within the sigmoid colon. No diverticulitis is noted. The appendix has been surgically removed. Small bowel and stomach are within normal limits. Vascular/Lymphatic: No significant vascular findings are present. No enlarged abdominal or pelvic lymph nodes. Reproductive: Uterus and bilateral adnexa are unremarkable. Other: No abdominal wall  hernia or abnormality. No abdominopelvic ascites. Musculoskeletal: No acute or significant osseous findings. IMPRESSION: Fatty liver. No findings to suggest pyelonephritis. Diverticular change of the colon. Electronically Signed   By: Oneil Devonshire M.D.   On: 01/17/2024 21:45   DG Chest Port 1 View Result Date: 01/17/2024 CLINICAL DATA:  Sepsis EXAM: PORTABLE CHEST 1 VIEW COMPARISON:  Chest radiograph dated 08/30/2005 FINDINGS: Normal lung volumes. No focal consolidations. No pleural effusion or pneumothorax. The heart size and mediastinal contours are within normal limits. No acute osseous abnormality. IMPRESSION: No acute disease. Electronically Signed   By: Limin  Xu M.D.   On: 01/17/2024 19:20   Scheduled Meds:  Dextromethorphan -buPROPion  ER  1 tablet Oral BID   FLUoxetine   40 mg Oral Daily   levothyroxine   50 mcg Oral QAC breakfast   lithium  carbonate  450 mg Oral QHS   pantoprazole  (PROTONIX ) IV  40 mg Intravenous Q12H   sodium chloride  flush  3 mL Intravenous Q12H   Continuous Infusions:  cefTRIAXone  (ROCEPHIN )  IV     lactated ringers  150 mL/hr at 01/18/24 0838   [START ON 01/19/2024] vancomycin        LOS: 1 day    Time spent: 50 mins    Darcel Dawley, MD [facility]   If 7PM-7AM, please contact night-coverage

## 2024-01-18 NOTE — Plan of Care (Signed)
  Problem: Education: Goal: Knowledge of General Education information will improve Description: Including pain rating scale, medication(s)/side effects and non-pharmacologic comfort measures Outcome: Progressing   Problem: Clinical Measurements: Goal: Will remain free from infection Outcome: Progressing   Problem: Nutrition: Goal: Adequate nutrition will be maintained Outcome: Progressing   Problem: Pain Managment: Goal: General experience of comfort will improve and/or be controlled Outcome: Progressing

## 2024-01-18 NOTE — Progress Notes (Signed)
Patient back to the room.

## 2024-01-18 NOTE — Progress Notes (Signed)
 Patient arrived in the unit accompanied by PTAR. No calls for handover received. Information acquired for the patient through Notes and SBAR only.

## 2024-01-18 NOTE — Progress Notes (Signed)
 Patient was sent to MRI via bed accompanied by transport.

## 2024-01-18 NOTE — TOC Initial Note (Signed)
 Transition of Care [facility]) - Initial/Assessment Note    Patient Details  Name: Amanda Rogers MRN: [id number] Date of Birth: 20-Mar-1981  Transition of [facility]) CM/SW Contact:    Jeoffrey LITTIE Moose, LCSW Phone Number: 01/18/2024, 8:55 AM  Clinical Narrative:                 Pt admitted from home due to bladder pain after having a UTI for 3 weeks. No current TOC needs, Please consult as needs arise.         Patient Goals and CMS Choice            Expected Discharge Plan and Services                                              Prior Living Arrangements/Services                       Activities of Daily Living      Permission Sought/Granted                  Emotional Assessment              Admission diagnosis:  Urinary retention [R33.9] Sepsis (HCC) [A41.9] Sepsis, due to unspecified organism, unspecified whether acute organ dysfunction present [facility]) [A41.9] Patient Active Problem List   Diagnosis Date Noted   Sepsis (HCC) 01/17/2024   Hypovolemia 01/17/2024   Midline low back pain 01/17/2024   Acute urinary retention 01/17/2024   ABDOMINAL PAIN, LOWER 08/16/2007   HEMATOCHEZIA, HX OF 08/16/2007   PCP:  Gladystine Erminio LITTIE, MD Pharmacy:   [facility] [phone] - [place], [place] - [address] AT [facility] [address] [place] [address] Phone: [phone] Fax: [phone]  [id number] - [place], [place] - [address] [place] [address] Phone: [phone] Fax: [phone]  [facility] - [place], [place] - [address] [facility] [address] [place] [address] Phone: [phone] Fax: [phone]  [facility] [address] [place] [address] Phone: [phone] Fax: [phone]     Social Drivers of Health (SDOH) Social History: SDOH Screenings   Food Insecurity: No Food  Insecurity (01/09/2024)   Received from [facility]  Housing: Low Risk  (01/09/2024)   Received from [facility]  Transportation Needs: No Transportation Needs (01/09/2024)   Received from [facility]  Utilities: Not At Risk (01/09/2024)   Received from [facility]  Financial Resource Strain: Low Risk  (01/09/2024)   Received from [facility]  Physical Activity: Insufficiently Active (01/09/2024)   Received from [facility]  Social Connections: Socially Integrated (01/09/2024)   Received from [facility]  Stress: No Stress Concern Present (01/09/2024)   Received from [facility]  Tobacco Use: Medium Risk (01/17/2024)   SDOH Interventions:     Readmission Risk Interventions     No data to display

## 2024-01-19 DIAGNOSIS — A419 Sepsis, unspecified organism: Secondary | ICD-10-CM | POA: Diagnosis not present

## 2024-01-19 LAB — URINE CULTURE: Culture: >=100000 — AB

## 2024-01-19 MED ORDER — PROCHLORPERAZINE MALEATE 10 MG PO TABS
10.0000 mg | ORAL_TABLET | Freq: Four times a day (QID) | ORAL | Status: DC | PRN
  Administered 2024-01-19 – 2024-01-20 (×2): 10 mg via ORAL
  Filled 2024-01-19 (×4): qty 1

## 2024-01-19 NOTE — Plan of Care (Signed)
   Problem: Education: Goal: Knowledge of General Education information will improve Description Including pain rating scale, medication(s)/side effects and non-pharmacologic comfort measures Outcome: Progressing

## 2024-01-19 NOTE — Progress Notes (Signed)
 PROGRESS NOTE    Amanda Rogers  FMW:[id number] DOB: [birth year] DOA: 01/17/2024 PCP: Gladystine Erminio CROME, MD   Brief Narrative:  This 43 yrs old female with PMH significant for recurrent UTI on suppressive abx, ureteral stricture with hx multiple dilations, lumbar spondylosis, radiculopathy, with hx of recent L5-S1 intracept procedure (intraosseous nerve ablation) in 3/25, PCOS, mood d/o, who presents with severe L flank and lower back pain. She reports her symptoms have been intermittent over the past 3 weeks. She describes  stabing / sharp pain in the L groin mainly. Over time has become more severe esp in the past day. She relates symptoms similar to prior UTI symptoms. Saw her PCP and was Rx'd for Ciprofloxacin for 7 day course which she finished yesterday. Despite Abx symptoms have persisted. Also has worsened midline low back pain, left upper and lateral thigh hypoesthesia, and urinary retention x 1 day. No weakness in her lower extremities. No incontinence. Otherwise has decreased PO intake, nausea, and vomiting with bilious emesis x ~ 1 week. And over past week has noted intermittent dark stools. Has been taking NSAIDs for relief of her pain. No other AC / AntiPLT.  Patient was admitted for further evaluation.  Assessment & Plan:   Principal Problem:   Sepsis (HCC) Active Problems:   Hypovolemia   Midline low back pain   Acute urinary retention   Sepsis with unknown source: Lactic acidosis : Partially treated UTI: She presented with hypotension, tachycardia, lactic acidosis, leukocytosis. She has recently completed course of ciprofloxacin for UTI. UA not impressive for UTI, rare bactereia, no pyuria, trace leukocytes. Urine culture growing E .coli. CT A/P without acute findings.  Lactic acid improved with IV hydration.  Blood pressure improved.   Could be spinal process like OM / Discitis, epidural abscess.  Continue ceftriaxone  and vancomycin . Blood cultures no growth so far,  urine cultures E. coli MRI L-spine : No evidence of lumbar spine infection, no acute or inflammatory process identified. Continue to monitor. Vancomycin  discontinued.  Continue with ceftriaxone .   Suspect slow UGIB:  Likely gastritis, NSAID related.  She reports intermittent melena x 1 week in setting of NSAID use for symptomatic relief.   Hb remains stable above 14.0 Continue pantoprazole  40 mg IV twice daily. Check FOBT. If more significant bleeding / decline in Hb,  will need GI involvement.   Acute urinary retention : Foley catheter placed in the ED for urinary retention.   Start Voiding trial,  may need intermittent catheterization. Will need urology evaluation for possible urethral dilation, anticipate may be done outpatient.   Hyponatremia, likely hypovolemic / low solute. Continue to monitor with IV hydration.   lumbar spondylosis, radiculopathy: hx of recent L5-S1 intracept procedure (intraosseous nerve ablation) in 3/'25,:  Noted, advised on avoidance of NSAIDs in setting of likely gastritis.  PCOS:  Hold metformin.  Hypothyroidism:  Continue levothyroxine .  Mood disorder:  Continue home lithium , clonazepam  prn.  Hold propranolol  in setting of low BP    DVT prophylaxis: SCDs Code Status:Full code Family Communication: No family at bed side Disposition Plan:    Status is: Inpatient Remains inpatient appropriate because: Severity of illness.   Consultants:  None  Procedures: MRI L spine  Antimicrobials:  Anti-infectives (From admission, onward)    Start     Dose/Rate Route Frequency Ordered Stop   01/19/24 0000  vancomycin  (VANCOREADY) IVPB 1750 mg/350 mL  Status:  Discontinued        1,750 mg 175 mL/hr over 120  Minutes Intravenous Every 24 hours 01/17/24 2258 01/19/24 1115   01/18/24 1900  cefTRIAXone  (ROCEPHIN ) 2 g in sodium chloride  0.9 % 100 mL IVPB        2 g 200 mL/hr over 30 Minutes Intravenous Every 24 hours 01/17/24 2233     01/17/24 2300   vancomycin  (VANCOREADY) IVPB 2000 mg/400 mL        2,000 mg 200 mL/hr over 120 Minutes Intravenous  Once 01/17/24 2251 01/18/24 0337   01/17/24 1900  cefTRIAXone  (ROCEPHIN ) 2 g in sodium chloride  0.9 % 100 mL IVPB        2 g 200 mL/hr over 30 Minutes Intravenous Once 01/17/24 1856 01/17/24 2015      Subjective: Patient was seen and examined at bedside.  Overnight events noted. Patient reports doing much better , still reports having pain in the back. She wants to start voiding trial.  Objective: Vitals:   01/18/24 2032 01/19/24 0546 01/19/24 0635 01/19/24 0747  BP: (!) 106/59 (!) 93/46 108/69 102/65  Pulse: (!) 101 85  79  Resp: 18 18  16   Temp: 98.1 F (36.7 C) 97.6 F (36.4 C)  97.7 F (36.5 C)  TempSrc: Oral Oral  Oral  SpO2: 94% 95%  97%  Weight:      Height:        Intake/Output Summary (Last 24 hours) at 01/19/2024 1323 Last data filed at 01/19/2024 1200 Gross per 24 hour  Intake 907.96 ml  Output 4700 ml  Net -3792.04 ml   Filed Weights   01/17/24 1652 01/18/24 0143  Weight: 108 kg 104.9 kg    Examination:  General exam: Appears calm and comfortable, not in any acute distress. Respiratory system: CTA Bilaterally. Respiratory effort normal.  RR 16 Cardiovascular system: S1 & S2 heard, RRR. No JVD, murmurs, rubs, gallops or clicks.  Gastrointestinal system: Abdomen is non distended, soft and non tender. Normal bowel sounds heard. Central nervous system: Alert and oriented x 3.  Hypoesthesia in anterior proximal lateral left thigh. Extremities: No edema, no cyanosis, no clubbing. Skin: No rashes, lesions or ulcers Psychiatry: Judgement and insight appear normal. Mood & affect appropriate.     Data Reviewed: I have personally reviewed following labs and imaging studies  CBC: Recent Labs  Lab 01/17/24 1639 01/18/24 0727  WBC 14.9* 10.1  NEUTROABS 9.6*  --   HGB 14.9 12.7  HCT 44.7 38.0  MCV 95.1 96.9  PLT 290 210   Basic Metabolic Panel: Recent Labs   Lab 01/17/24 1639 01/18/24 0727  NA 131* 136  K 3.9 4.1  CL 101 107  CO2 17* 18*  GLUCOSE 99 93  BUN 8 7  CREATININE 0.96 0.91  CALCIUM 9.0 7.7*  MG  --  1.8  PHOS  --  4.0   GFR: Estimated Creatinine Clearance: 96.9 mL/min (by C-G formula based on SCr of 0.91 mg/dL). Liver Function Tests: Recent Labs  Lab 01/17/24 1639  AST 45*  ALT 43  ALKPHOS 51  BILITOT 0.6  PROT 8.1  ALBUMIN 3.9   Recent Labs  Lab 01/17/24 1639  LIPASE 31   No results for input(s): AMMONIA in the last 168 hours. Coagulation Profile: Recent Labs  Lab 01/17/24 1917  INR 1.0   Cardiac Enzymes: No results for input(s): CKTOTAL, CKMB, CKMBINDEX, TROPONINI in the last 168 hours. BNP (last 3 results) No results for input(s): PROBNP in the last 8760 hours. HbA1C: No results for input(s): HGBA1C in the last 72 hours. CBG:  No results for input(s): GLUCAP in the last 168 hours. Lipid Profile: No results for input(s): CHOL, HDL, LDLCALC, TRIG, CHOLHDL, LDLDIRECT in the last 72 hours. Thyroid Function Tests: Recent Labs    01/18/24 0727  TSH 10.285*   Anemia Panel: No results for input(s): VITAMINB12, FOLATE, FERRITIN, TIBC, IRON, RETICCTPCT in the last 72 hours. Sepsis Labs: Recent Labs  Lab 01/17/24 1849 01/17/24 2104  LATICACIDVEN 2.3* 1.6    Recent Results (from the past 240 hours)  Urine Culture (for pregnant, neutropenic or urologic patients or patients with an indwelling urinary catheter)     Status: Abnormal   Collection Time: 01/17/24  5:47 PM   Specimen: Urine, Clean Catch  Result Value Ref Range Status   Specimen Description   Final    URINE, CLEAN CATCH Performed at [facility] [address], [place], [place] [address]    Special Requests   Final    NONE Performed at [facility], [address] [place], [place] [address]    Culture >=100,000 COLONIES/mL ESCHERICHIA COLI (A)  Final    Report Status 01/19/2024 FINAL  Final   Organism ID, Bacteria ESCHERICHIA COLI (A)  Final      Susceptibility   Escherichia coli - MIC*    AMPICILLIN 4 SENSITIVE Sensitive     CEFAZOLIN <=4 SENSITIVE Sensitive     CEFEPIME <=0.12 SENSITIVE Sensitive     CEFTRIAXONE  <=0.25 SENSITIVE Sensitive     CIPROFLOXACIN 0.5 INTERMEDIATE Intermediate     GENTAMICIN <=1 SENSITIVE Sensitive     IMIPENEM <=0.25 SENSITIVE Sensitive     NITROFURANTOIN <=16 SENSITIVE Sensitive     TRIMETH/SULFA <=20 SENSITIVE Sensitive     AMPICILLIN/SULBACTAM <=2 SENSITIVE Sensitive     PIP/TAZO <=4 SENSITIVE Sensitive ug/mL    * >=100,000 COLONIES/mL ESCHERICHIA COLI  Blood culture (routine x 2)     Status: None (Preliminary result)   Collection Time: 01/17/24  6:40 PM   Specimen: BLOOD LEFT WRIST  Result Value Ref Range Status   Specimen Description   Final    BLOOD LEFT WRIST Performed at [facility], [address] [place], [place] [address]    Special Requests   Final    BOTTLES DRAWN AEROBIC AND ANAEROBIC Blood Culture adequate volume Performed at [facility], [address] [place], [place] [address]    Culture   Final    NO GROWTH 2 DAYS Performed at [facility], [address], [place], [place] [address]    Report Status PENDING  Incomplete  Wet prep, genital     Status: None   Collection Time: 01/17/24  7:24 PM   Specimen: PATH Cytology Cervicovaginal Ancillary Only  Result Value Ref Range Status   Yeast Wet Prep HPF POC NONE SEEN NONE SEEN Final   Trich, Wet Prep NONE SEEN NONE SEEN Final   Clue Cells Wet Prep HPF POC NONE SEEN NONE SEEN Final   WBC, Wet Prep HPF POC <10 <10 Final   Sperm NONE SEEN  Final    Comment: Performed at [facility], [address] [place], [place] [address]  Resp panel by RT-PCR (RSV, Flu A&B, Covid) Anterior Nasal Swab     Status: None   Collection Time: 01/17/24  7:35 PM   Specimen: Anterior Nasal Swab   Result Value Ref Range Status   SARS Coronavirus 2 by RT PCR NEGATIVE NEGATIVE Final    Comment: (NOTE) SARS-CoV-2 target  nucleic acids are NOT DETECTED.  The SARS-CoV-2 RNA is generally detectable in upper respiratory specimens during the acute phase of infection. The lowest concentration of SARS-CoV-2 viral copies this assay can detect is 138 copies/mL. A negative result does not preclude SARS-Cov-2 infection and should not be used as the sole basis for treatment or other patient management decisions. A negative result may occur with  improper specimen collection/handling, submission of specimen other than nasopharyngeal swab, presence of viral mutation(s) within the areas targeted by this assay, and inadequate number of viral copies(<138 copies/mL). A negative result must be combined with clinical observations, patient history, and epidemiological information. The expected result is Negative.  Fact Sheet for Patients:  BloggerCourse.com  Fact Sheet for Healthcare Providers:  SeriousBroker.it  This test is no t yet approved or cleared by the [place]  FDA and  has been authorized for detection and/or diagnosis of SARS-CoV-2 by FDA under an Emergency Use Authorization (EUA). This EUA will remain  in effect (meaning this test can be used) for the duration of the COVID-19 declaration under Section 564(b)(1) of the Act, 21 U.S.C.section 360bbb-3(b)(1), unless the authorization is terminated  or revoked sooner.       Influenza A by PCR NEGATIVE NEGATIVE Final   Influenza B by PCR NEGATIVE NEGATIVE Final    Comment: (NOTE) The Xpert Xpress SARS-CoV-2/FLU/RSV plus assay is intended as an aid in the diagnosis of influenza from Nasopharyngeal swab specimens and should not be used as a sole basis for treatment. Nasal washings and aspirates are unacceptable for Xpert Xpress SARS-CoV-2/FLU/RSV testing.  Fact Sheet for  Patients: BloggerCourse.com  Fact Sheet for Healthcare Providers: SeriousBroker.it  This test is not yet approved or cleared by the [place]  FDA and has been authorized for detection and/or diagnosis of SARS-CoV-2 by FDA under an Emergency Use Authorization (EUA). This EUA will remain in effect (meaning this test can be used) for the duration of the COVID-19 declaration under Section 564(b)(1) of the Act, 21 U.S.C. section 360bbb-3(b)(1), unless the authorization is terminated or revoked.     Resp Syncytial Virus by PCR NEGATIVE NEGATIVE Final    Comment: (NOTE) Fact Sheet for Patients: BloggerCourse.com  Fact Sheet for Healthcare Providers: SeriousBroker.it  This test is not yet approved or cleared by the [place]  FDA and has been authorized for detection and/or diagnosis of SARS-CoV-2 by FDA under an Emergency Use Authorization (EUA). This EUA will remain in effect (meaning this test can be used) for the duration of the COVID-19 declaration under Section 564(b)(1) of the Act, 21 U.S.C. section 360bbb-3(b)(1), unless the authorization is terminated or revoked.  Performed at [facility], [address] [place], [place] [address]   Blood culture (routine x 2)     Status: None (Preliminary result)   Collection Time: 01/18/24  7:27 AM   Specimen: BLOOD RIGHT HAND  Result Value Ref Range Status   Specimen Description BLOOD RIGHT HAND  Final   Special Requests   Final    BOTTLES DRAWN AEROBIC ONLY Blood Culture results may not be optimal due to an inadequate volume of blood received in culture bottles   Culture   Final    NO GROWTH 1 DAY Performed at [facility], [address], [place], [place] [address]    Report Status PENDING  Incomplete     Radiology Studies: MR Lumbar Spine W Wo Contrast Result Date: 01/18/2024 CLINICAL DATA:   43 year old female with left flank pain, UTI. EXAM:  MRI LUMBAR SPINE WITHOUT AND WITH CONTRAST TECHNIQUE: Multiplanar and multiecho pulse sequences of the lumbar spine were obtained without and with intravenous contrast. CONTRAST:  10mL GADAVIST  GADOBUTROL  1 MMOL/ML IV SOLN COMPARISON:  CT Abdomen and Pelvis yesterday. Lumbar MRI 08/24/2005. FINDINGS: Segmentation:  Normal on the CT yesterday. Alignment: Similar lumbar lordosis to the 2007 MRI. Mild chronic retrolisthesis of L5 on S1. No significant scoliosis. Vertebrae: Normal background bone marrow signal. Maintained vertebral height and alignment. Small benign L5 and S1 vertebral hemangiomas, correlated on CT axial and sagittal images yesterday (series 6, images 9 and 10). No marrow edema or suspicious osseous lesion identified. Intact visible sacral ala. Conus medullaris and cauda equina: Conus extends to the L1 level. No lower spinal cord or conus signal abnormality. No abnormal intradural enhancement. Normal spinal canal patency. Unremarkable cauda equina nerve roots. Paraspinal and other soft tissues: Negative. No paraspinal inflammation or fluid collection. Disc levels: Normal for age intervertebral disc signal and morphology from T12-L1 through L4-L5. Mild superimposed facet degeneration and hypertrophy at those levels. But no associated stenosis. L5-S1: Moderate to severe disc space loss with vacuum disc by CT yesterday. Circumferential disc osteophyte complex with a broad-based posterior component. Mild to moderate facet hypertrophy. But no spinal or lateral recess stenosis. Moderate left L5 neural foraminal stenosis. Mild to moderate right L5 foraminal stenosis. IMPRESSION: 1. No evidence of lumbar spinal infection, no acute or inflammatory process identified. 2. Advanced chronic disc and endplate degeneration at L5-S1 with moderate L5 nerve level foraminal stenosis greater on the left. 3. Mild for age lumbar spine degeneration otherwise. No spinal  stenosis. Electronically Signed   By: VEAR Hurst M.D.   On: 01/18/2024 04:46   CT ABDOMEN PELVIS W CONTRAST Result Date: 01/17/2024 CLINICAL DATA:  Left flank pain and history of UTI, evaluate for possible pyelonephritis EXAM: CT ABDOMEN AND PELVIS WITH CONTRAST TECHNIQUE: Multidetector CT imaging of the abdomen and pelvis was performed using the standard protocol following bolus administration of intravenous contrast. RADIATION DOSE REDUCTION: This exam was performed according to the departmental dose-optimization program which includes automated exposure control, adjustment of the mA and/or kV according to patient size and/or use of iterative reconstruction technique. CONTRAST:  OMNIPAQUE  IOHEXOL  300 MG/ML  SOLN COMPARISON:  01/06/2017 FINDINGS: Lower chest: No acute abnormality. Hepatobiliary: Fatty infiltration of the liver is noted. The gallbladder is within normal limits. Pancreas: Unremarkable. No pancreatic ductal dilatation or surrounding inflammatory changes. Spleen: Normal in size without focal abnormality. Adrenals/Urinary Tract: Adrenal glands are within normal limits. Kidneys demonstrate a normal enhancement pattern bilaterally. Small exophytic cyst is noted posteriorly in the left kidney. No follow-up is recommended. No renal calculi or obstructive changes are noted. Ureters are within normal limits. The bladder is decompressed by Foley catheter. Stomach/Bowel: No obstructive or inflammatory changes of the colon are noted. Mild diverticular changes seen within the sigmoid colon. No diverticulitis is noted. The appendix has been surgically removed. Small bowel and stomach are within normal limits. Vascular/Lymphatic: No significant vascular findings are present. No enlarged abdominal or pelvic lymph nodes. Reproductive: Uterus and bilateral adnexa are unremarkable. Other: No abdominal wall hernia or abnormality. No abdominopelvic ascites. Musculoskeletal: No acute or significant osseous findings.  IMPRESSION: Fatty liver. No findings to suggest pyelonephritis. Diverticular change of the colon. Electronically Signed   By: Oneil Devonshire M.D.   On: 01/17/2024 21:45   DG Chest Port 1 View Result Date: 01/17/2024 CLINICAL DATA:  Sepsis EXAM: PORTABLE CHEST 1 VIEW COMPARISON:  Chest  radiograph dated 08/30/2005 FINDINGS: Normal lung volumes. No focal consolidations. No pleural effusion or pneumothorax. The heart size and mediastinal contours are within normal limits. No acute osseous abnormality. IMPRESSION: No acute disease. Electronically Signed   By: Limin  Xu M.D.   On: 01/17/2024 19:20   Scheduled Meds:  Chlorhexidine  Gluconate Cloth  6 each Topical Daily   Dextromethorphan -buPROPion  ER  1 tablet Oral BID   FLUoxetine   40 mg Oral Daily   levothyroxine   50 mcg Oral QAC breakfast   lithium  carbonate  450 mg Oral QHS   pantoprazole  (PROTONIX ) IV  40 mg Intravenous Q12H   sodium chloride  flush  3 mL Intravenous Q12H   Continuous Infusions:  sodium chloride  40 mL/hr at 01/19/24 0503   cefTRIAXone  (ROCEPHIN )  IV Stopped (01/18/24 1850)     LOS: 2 days    Time spent: 35 mins    Darcel Dawley, MD [facility]   If 7PM-7AM, please contact night-coverage

## 2024-01-19 NOTE — Progress Notes (Signed)
 Foley removed per MD order and patient tried several times to urinate but unable.  Bladder scan done and noted [phone] MD paged regarding and ordered to replace foley, drained 1000 ml of amber with mucous plug noted

## 2024-01-19 NOTE — Plan of Care (Signed)

## 2024-01-20 DIAGNOSIS — N390 Urinary tract infection, site not specified: Secondary | ICD-10-CM | POA: Diagnosis not present

## 2024-01-20 DIAGNOSIS — R338 Other retention of urine: Secondary | ICD-10-CM | POA: Diagnosis not present

## 2024-01-20 DIAGNOSIS — A419 Sepsis, unspecified organism: Secondary | ICD-10-CM | POA: Diagnosis not present

## 2024-01-20 LAB — CBC
HCT: 40.3 % (ref 36.0–46.0)
Hemoglobin: 13.5 g/dL (ref 12.0–15.0)
MCH: 32.5 pg (ref 26.0–34.0)
MCHC: 33.5 g/dL (ref 30.0–36.0)
MCV: 97.1 fL (ref 80.0–100.0)
Platelets: 200 K/uL (ref 150–400)
RBC: 4.15 MIL/uL (ref 3.87–5.11)
RDW: 13.2 % (ref 11.5–15.5)
WBC: 8.3 K/uL (ref 4.0–10.5)
nRBC: 0 % (ref 0.0–0.2)

## 2024-01-20 LAB — BASIC METABOLIC PANEL WITH GFR
Anion gap: 11 (ref 5–15)
BUN: 5 mg/dL — ABNORMAL LOW (ref 6–20)
CO2: 20 mmol/L — ABNORMAL LOW (ref 22–32)
Calcium: 8.5 mg/dL — ABNORMAL LOW (ref 8.9–10.3)
Chloride: 105 mmol/L (ref 98–111)
Creatinine, Ser: 0.84 mg/dL (ref 0.44–1.00)
GFR, Estimated: >60 mL/min (ref >60)
Glucose, Bld: 98 mg/dL (ref 70–99)
Potassium: 4 mmol/L (ref 3.5–5.1)
Sodium: 136 mmol/L (ref 135–145)

## 2024-01-20 LAB — PHOSPHORUS: Phosphorus: 5 mg/dL — ABNORMAL HIGH (ref 2.5–4.6)

## 2024-01-20 LAB — MAGNESIUM: Magnesium: 1.9 mg/dL (ref 1.7–2.4)

## 2024-01-20 MED ORDER — PANTOPRAZOLE SODIUM 40 MG PO TBEC
40.0000 mg | DELAYED_RELEASE_TABLET | Freq: Every day | ORAL | Status: DC
  Administered 2024-01-20 – 2024-01-22 (×3): 40 mg via ORAL
  Filled 2024-01-20 (×3): qty 1

## 2024-01-20 MED ORDER — LIDOCAINE 5 % EX PTCH
1.0000 | MEDICATED_PATCH | CUTANEOUS | Status: DC
  Administered 2024-01-20 – 2024-01-21 (×2): 1 via TRANSDERMAL
  Filled 2024-01-20: qty 1

## 2024-01-20 NOTE — Progress Notes (Signed)
 Dilaudid  and zofran  given per pt request

## 2024-01-20 NOTE — Progress Notes (Signed)
 PROGRESS NOTE    Amanda Rogers  FMW:[id number] DOB: 1981/02/17 DOA: 01/17/2024 PCP: Gladystine Erminio CROME, MD   Brief Narrative:  This 43 yrs old female with PMH significant for recurrent UTI on suppressive abx, ureteral stricture with hx multiple dilations, lumbar spondylosis, radiculopathy, with hx of recent L5-S1 intracept procedure (intraosseous nerve ablation) in 3/25, PCOS, mood d/o, who presents with severe L flank and lower back pain. She reports her symptoms have been intermittent over the past 3 weeks. She describes  stabing / sharp pain in the L groin mainly. Over time has become more severe esp in the past day. She relates symptoms similar to prior UTI symptoms. Saw her PCP and was Rx'd for Ciprofloxacin for 7 day course which she finished yesterday. Despite Abx symptoms have persisted. Also has worsened midline low back pain, left upper and lateral thigh hypoesthesia, and urinary retention x 1 day. No weakness in her lower extremities. No incontinence. Otherwise has decreased PO intake, nausea, and vomiting with bilious emesis x ~ 1 week. And over past week has noted intermittent dark stools. Has been taking NSAIDs for relief of her pain. No other AC / AntiPLT.  Patient was admitted for further evaluation.  Assessment & Plan:   Principal Problem:   Sepsis (HCC) Active Problems:   Hypovolemia   Midline low back pain   Acute urinary retention   Sepsis with unknown source: POA, resolved  Lactic acidosis : Partially treated UTI: She presented with hypotension, tachycardia, lactic acidosis, leukocytosis. She has recently completed course of ciprofloxacin for UTI. UA not impressive for UTI, rare bactereia, no pyuria, trace leukocytes. Urine culture growing E .coli, sensitivities notable for intermediate sensitivity to cipro  CT A/P without acute findings.  Lactic acid improved with IV hydration.  Blood pressure improved.   MRI L-spine : No evidence of lumbar spine infection, no acute  or inflammatory process identified. Blood cultures no growth so far, urine cultures E. coli  Continue ceftriaxone   Can transition to PO in the AM    Suspect slow UGIB:  Likely gastritis, NSAID related.  She reports intermittent melena x 1 week in setting of NSAID use for symptomatic relief.   Hb stable Transition to daily protonix  PO Discuss importance of limiting NSAID use   Acute urinary retention : Foley catheter placed in the ED for urinary retention.   During Voiding trial bladder scan w/ ~600cc urine, patient had foley placed and 1L of urine removed  Has had UR in the past and required urethral dilation.  Will need to continue foley on discharge with outpatient foley. Patient will contact [facility] in the AM to schedule appoint.     Hyponatremia, likely hypovolemic / low solute. Resolved with IV hydration     Latest Ref Rng & Units 01/20/2024    6:47 AM 01/18/2024    7:27 AM 01/17/2024    4:39 PM  BMP  Glucose 70 - 99 mg/dL 98  93  99   BUN 6 - 20 mg/dL 5  7  8    Creatinine 0.44 - 1.00 mg/dL 9.15  9.08  9.03   Sodium 135 - 145 mmol/L 136  136  131   Potassium 3.5 - 5.1 mmol/L 4.0  4.1  3.9   Chloride 98 - 111 mmol/L 105  107  101   CO2 22 - 32 mmol/L 20  18  17    Calcium 8.9 - 10.3 mg/dL 8.5  7.7  9.0  lumbar spondylosis, radiculopathy: hx of recent L5-S1 intracept procedure (intraosseous nerve ablation) in 3/25,:  Noted, advised on avoidance of NSAIDs in setting of likely gastritis.  PCOS:  Hold metformin. Resume on discharge   Hypothyroidism:  TSH 10.2, will need repeat labs out of the acute setting. Continue levothyroxine .  Mood disorder:  Continue home lithium , clonazepam  prn.  Hold propranolol  in setting of low BP   Class 2 obesity  BMI 38.4 Complicates care    DVT prophylaxis: SCDs Code Status:Full code Family Communication: No family at bed side Disposition Plan:    Status is: Inpatient Remains inpatient appropriate because: Severity  of illness.   Consultants:  None  Procedures: None  Antimicrobials:  Anti-infectives (From admission, onward)    Start     Dose/Rate Route Frequency Ordered Stop   01/19/24 0000  vancomycin  (VANCOREADY) IVPB 1750 mg/350 mL  Status:  Discontinued        1,750 mg 175 mL/hr over 120 Minutes Intravenous Every 24 hours 01/17/24 2258 01/19/24 1115   01/18/24 1900  cefTRIAXone  (ROCEPHIN ) 2 g in sodium chloride  0.9 % 100 mL IVPB        2 g 200 mL/hr over 30 Minutes Intravenous Every 24 hours 01/17/24 2233     01/17/24 2300  vancomycin  (VANCOREADY) IVPB 2000 mg/400 mL        2,000 mg 200 mL/hr over 120 Minutes Intravenous  Once 01/17/24 2251 01/18/24 0337   01/17/24 1900  cefTRIAXone  (ROCEPHIN ) 2 g in sodium chloride  0.9 % 100 mL IVPB        2 g 200 mL/hr over 30 Minutes Intravenous Once 01/17/24 1856 01/17/24 2015      Subjective: Continues to have some lower abdominal discomfort and some L flank pain. Feels this is related to lying in bed as well as pain from bladder stretch. Discussed walking.   Objective: Vitals:   01/19/24 1612 01/19/24 2032 01/20/24 0423 01/20/24 0831  BP: 101/66 121/61 (!) 109/59 114/73  Pulse: 78 79 81 66  Resp: 16 17 17 14   Temp: 97.8 F (36.6 C) 98.2 F (36.8 C) 98.2 F (36.8 C) 98 F (36.7 C)  TempSrc: Oral Oral  Oral  SpO2: 98% 95% 97% 95%  Weight:      Height:        Intake/Output Summary (Last 24 hours) at 01/20/2024 1024 Last data filed at 01/20/2024 0840 Gross per 24 hour  Intake 752 ml  Output 3620 ml  Net -2868 ml   Filed Weights   01/17/24 1652 01/18/24 0143  Weight: 108 kg 104.9 kg    Physical Exam  Constitutional: In no distress.  Cardiovascular: Normal rate, regular rhythm. No lower extremity edema  Pulmonary: Non labored breathing on room air, no wheezing or rales.   Abdominal: Soft. Non distended. Mild TTP in LLQ and to the L flank.  Neurological: Alert and oriented to person, place, and time. Non focal  Skin: Skin is warm  and dry.    Data Reviewed: I have personally reviewed following labs and imaging studies  CBC: Recent Labs  Lab 01/17/24 1639 01/18/24 0727 01/20/24 0647  WBC 14.9* 10.1 8.3  NEUTROABS 9.6*  --   --   HGB 14.9 12.7 13.5  HCT 44.7 38.0 40.3  MCV 95.1 96.9 97.1  PLT 290 210 200   Basic Metabolic Panel: Recent Labs  Lab 01/17/24 1639 01/18/24 0727 01/20/24 0647  NA 131* 136 136  K 3.9 4.1 4.0  CL 101 107 105  CO2 17* 18* 20*  GLUCOSE 99 93 98  BUN 8 7 5*  CREATININE 0.96 0.91 0.84  CALCIUM 9.0 7.7* 8.5*  MG  --  1.8 1.9  PHOS  --  4.0 5.0*   GFR: Estimated Creatinine Clearance: 105 mL/min (by C-G formula based on SCr of 0.84 mg/dL). Liver Function Tests: Recent Labs  Lab 01/17/24 1639  AST 45*  ALT 43  ALKPHOS 51  BILITOT 0.6  PROT 8.1  ALBUMIN 3.9   Recent Labs  Lab 01/17/24 1639  LIPASE 31   No results for input(s): AMMONIA in the last 168 hours. Coagulation Profile: Recent Labs  Lab 01/17/24 1917  INR 1.0   Cardiac Enzymes: No results for input(s): CKTOTAL, CKMB, CKMBINDEX, TROPONINI in the last 168 hours. BNP (last 3 results) No results for input(s): PROBNP in the last 8760 hours. HbA1C: No results for input(s): HGBA1C in the last 72 hours. CBG: No results for input(s): GLUCAP in the last 168 hours. Lipid Profile: No results for input(s): CHOL, HDL, LDLCALC, TRIG, CHOLHDL, LDLDIRECT in the last 72 hours. Thyroid Function Tests: Recent Labs    01/18/24 0727  TSH 10.285*   Anemia Panel: No results for input(s): VITAMINB12, FOLATE, FERRITIN, TIBC, IRON, RETICCTPCT in the last 72 hours. Sepsis Labs: Recent Labs  Lab 01/17/24 1849 01/17/24 2104  LATICACIDVEN 2.3* 1.6    Recent Results (from the past 240 hours)  Urine Culture (for pregnant, neutropenic or urologic patients or patients with an indwelling urinary catheter)     Status: Abnormal   Collection Time: 01/17/24  5:47 PM   Specimen:  Urine, Clean Catch  Result Value Ref Range Status   Specimen Description   Final    URINE, CLEAN CATCH Performed at [facility], [address] [place], [place] [address]    Special Requests   Final    NONE Performed at [facility], [address], [place], [place] [address]    Culture >=100,000 COLONIES/mL ESCHERICHIA COLI (A)  Final   Report Status 01/19/2024 FINAL  Final   Organism ID, Bacteria ESCHERICHIA COLI (A)  Final      Susceptibility   Escherichia coli - MIC*    AMPICILLIN 4 SENSITIVE Sensitive     CEFAZOLIN <=4 SENSITIVE Sensitive     CEFEPIME <=0.12 SENSITIVE Sensitive     CEFTRIAXONE  <=0.25 SENSITIVE Sensitive     CIPROFLOXACIN 0.5 INTERMEDIATE Intermediate     GENTAMICIN <=1 SENSITIVE Sensitive     IMIPENEM <=0.25 SENSITIVE Sensitive     NITROFURANTOIN <=16 SENSITIVE Sensitive     TRIMETH/SULFA <=20 SENSITIVE Sensitive     AMPICILLIN/SULBACTAM <=2 SENSITIVE Sensitive     PIP/TAZO <=4 SENSITIVE Sensitive ug/mL    * >=100,000 COLONIES/mL ESCHERICHIA COLI  Blood culture (routine x 2)     Status: None (Preliminary result)   Collection Time: 01/17/24  6:40 PM   Specimen: BLOOD LEFT WRIST  Result Value Ref Range Status   Specimen Description   Final    BLOOD LEFT WRIST Performed at [facility], [address], [place], [place] [address]    Special Requests   Final    BOTTLES DRAWN AEROBIC AND ANAEROBIC Blood Culture adequate volume Performed at [facility], [address] [place], [place] [address]    Culture   Final    NO GROWTH 2 DAYS Performed at [facility], [address] [place], [place] [address]    Report Status PENDING  Incomplete  Wet prep, genital     Status: None   Collection Time: 01/17/24  7:24 PM   Specimen: PATH Cytology Cervicovaginal Ancillary Only  Result Value Ref Range Status   Yeast Wet Prep HPF POC NONE SEEN NONE SEEN Final   Trich, Wet Prep NONE SEEN NONE SEEN  Final   Clue Cells Wet Prep HPF POC NONE SEEN NONE SEEN Final   WBC, Wet Prep HPF POC <10 <10 Final   Sperm NONE SEEN  Final    Comment: Performed at [facility], [address], [place], [place] [address]  Resp panel by RT-PCR (RSV, Flu A&B, Covid) Anterior Nasal Swab     Status: None   Collection Time: 01/17/24  7:35 PM   Specimen: Anterior Nasal Swab  Result Value Ref Range Status   SARS Coronavirus 2 by RT PCR NEGATIVE NEGATIVE Final    Comment: (NOTE) SARS-CoV-2 target nucleic acids are NOT DETECTED.  The SARS-CoV-2 RNA is generally detectable in upper respiratory specimens during the acute phase of infection. The lowest concentration of SARS-CoV-2 viral copies this assay can detect is 138 copies/mL. A negative result does not preclude SARS-Cov-2 infection and should not be used as the sole basis for treatment or other patient management decisions. A negative result may occur with  improper specimen collection/handling, submission of specimen other than nasopharyngeal swab, presence of viral mutation(s) within the areas targeted by this assay, and inadequate number of viral copies(<138 copies/mL). A negative result must be combined with clinical observations, patient history, and epidemiological information. The expected result is Negative.  Fact Sheet for Patients:  BloggerCourse.com  Fact Sheet for Healthcare Providers:  SeriousBroker.it  This test is no t yet approved or cleared by the [place]  FDA and  has been authorized for detection and/or diagnosis of SARS-CoV-2 by FDA under an Emergency Use Authorization (EUA). This EUA will remain  in effect (meaning this test can be used) for the duration of the COVID-19 declaration under Section 564(b)(1) of the Act, 21 U.S.C.section 360bbb-3(b)(1), unless the authorization is terminated  or revoked sooner.       Influenza A by PCR NEGATIVE NEGATIVE  Final   Influenza B by PCR NEGATIVE NEGATIVE Final    Comment: (NOTE) The Xpert Xpress SARS-CoV-2/FLU/RSV plus assay is intended as an aid in the diagnosis of influenza from Nasopharyngeal swab specimens and should not be used as a sole basis for treatment. Nasal washings and aspirates are unacceptable for Xpert Xpress SARS-CoV-2/FLU/RSV testing.  Fact Sheet for Patients: BloggerCourse.com  Fact Sheet for Healthcare Providers: SeriousBroker.it  This test is not yet approved or cleared by the [place]  FDA and has been authorized for detection and/or diagnosis of SARS-CoV-2 by FDA under an Emergency Use Authorization (EUA). This EUA will remain in effect (meaning this test can be used) for the duration of the COVID-19 declaration under Section 564(b)(1) of the Act, 21 U.S.C. section 360bbb-3(b)(1), unless the authorization is terminated or revoked.     Resp Syncytial Virus by PCR NEGATIVE NEGATIVE Final    Comment: (NOTE) Fact Sheet for Patients: BloggerCourse.com  Fact Sheet for Healthcare Providers: SeriousBroker.it  This test is not yet approved or cleared by the [place]  FDA and has been authorized for detection and/or diagnosis of SARS-CoV-2 by FDA under an Emergency Use Authorization (EUA). This EUA will remain in effect (meaning this test can be used) for the duration of the COVID-19 declaration under Section 564(b)(1) of the Act, 21 U.S.C. section  360bbb-3(b)(1), unless the authorization is terminated or revoked.  Performed at [facility], [address] [place], [place] [address]   Blood culture (routine x 2)     Status: None (Preliminary result)   Collection Time: 01/18/24  7:27 AM   Specimen: BLOOD RIGHT HAND  Result Value Ref Range Status   Specimen Description BLOOD RIGHT HAND  Final   Special Requests   Final    BOTTLES DRAWN  AEROBIC ONLY Blood Culture results may not be optimal due to an inadequate volume of blood received in culture bottles   Culture   Final    NO GROWTH 1 DAY Performed at [facility], [address] [place], [place] [address]    Report Status PENDING  Incomplete     Radiology Studies: No results found.  Scheduled Meds:  Chlorhexidine  Gluconate Cloth  6 each Topical Daily   FLUoxetine   40 mg Oral Daily   levothyroxine   50 mcg Oral QAC breakfast   lithium  carbonate  450 mg Oral QHS   pantoprazole  (PROTONIX ) IV  40 mg Intravenous Q12H   sodium chloride  flush  3 mL Intravenous Q12H   Continuous Infusions:  cefTRIAXone  (ROCEPHIN )  IV 2 g (01/19/24 2111)     LOS: 3 days    Time spent: 35 mins    Alban Pepper, MD [facility]   If 7PM-7AM, please contact night-coverage

## 2024-01-21 ENCOUNTER — Inpatient Hospital Stay (HOSPITAL_COMMUNITY): Payer: PRIVATE HEALTH INSURANCE

## 2024-01-21 DIAGNOSIS — R338 Other retention of urine: Secondary | ICD-10-CM | POA: Diagnosis not present

## 2024-01-21 DIAGNOSIS — A419 Sepsis, unspecified organism: Secondary | ICD-10-CM | POA: Diagnosis not present

## 2024-01-21 DIAGNOSIS — N390 Urinary tract infection, site not specified: Secondary | ICD-10-CM | POA: Diagnosis not present

## 2024-01-21 LAB — CBC
HCT: 39.4 % (ref 36.0–46.0)
Hemoglobin: 13 g/dL (ref 12.0–15.0)
MCH: 31.8 pg (ref 26.0–34.0)
MCHC: 33 g/dL (ref 30.0–36.0)
MCV: 96.3 fL (ref 80.0–100.0)
Platelets: 236 K/uL (ref 150–400)
RBC: 4.09 MIL/uL (ref 3.87–5.11)
RDW: 13.2 % (ref 11.5–15.5)
WBC: 9.4 K/uL (ref 4.0–10.5)
nRBC: 0 % (ref 0.0–0.2)

## 2024-01-21 NOTE — Plan of Care (Signed)
  Problem: Activity: Goal: Risk for activity intolerance will decrease Outcome: Not Progressing   Problem: Elimination: Goal: Will not experience complications related to urinary retention Outcome: Not Progressing   Problem: Pain Managment: Goal: General experience of comfort will improve and/or be controlled Outcome: Not Progressing   Problem: Safety: Goal: Ability to remain free from injury will improve Outcome: Not Progressing

## 2024-01-21 NOTE — Progress Notes (Signed)
 PROGRESS NOTE    Amanda Rogers  FMW:[id number] DOB: 1980-08-03 DOA: 01/17/2024 PCP: Gladystine Erminio CROME, MD   Brief Narrative:  This 43 yrs old female with PMH significant for recurrent UTI on suppressive abx, ureteral stricture with hx multiple dilations, lumbar spondylosis, radiculopathy, with hx of recent L5-S1 intracept procedure (intraosseous nerve ablation) in 3/25, PCOS, mood d/o, who presents with severe L flank and lower back pain. She reports her symptoms have been intermittent over the past 3 weeks. She describes  stabing / sharp pain in the L groin mainly. Over time has become more severe esp in the past day. She relates symptoms similar to prior UTI symptoms. Saw her PCP and was Rx'd for Ciprofloxacin for 7 day course which she finished yesterday. Despite Abx symptoms have persisted. Also has worsened midline low back pain, left upper and lateral thigh hypoesthesia, and urinary retention x 1 day. No weakness in her lower extremities. No incontinence. Otherwise has decreased PO intake, nausea, and vomiting with bilious emesis x ~ 1 week. And over past week has noted intermittent dark stools. Has been taking NSAIDs for relief of her pain. No other AC / AntiPLT.  Patient was admitted for further evaluation.  Assessment & Plan:   Principal Problem:   Sepsis (HCC) Active Problems:   Hypovolemia   Midline low back pain   Acute urinary retention   Partially treated UTI: Sepsis with unknown source: POA, resolved  Lactic acidosis resolved : She presented with hypotension, tachycardia, lactic acidosis, leukocytosis. She recently completed course of ciprofloxacin for UTI. UA not impressive for UTI, rare bactereia, no pyuria, trace leukocytes. Urine culture growing E .coli, sensitivities notable for intermediate sensitivity to cipro  CT A/P without acute findings or findings of pyelonephritis.  MRI L-spine : No evidence of lumbar spine infection, no acute or inflammatory process  identified. Blood cultures no growth to date, urine cultures E. coli  Transition to PO on discharge    Suspect slow UGIB:  Likely gastritis, NSAID related.  She reports intermittent melena x 1 week in setting of NSAID use for symptomatic relief.   Hb stable Continue daily protonix  PO Discuss importance of limiting NSAID use     Latest Ref Rng & Units 01/21/2024   10:49 AM 01/20/2024    6:47 AM 01/18/2024    7:27 AM  CBC  WBC 4.0 - 10.5 K/uL 9.4  8.3  10.1   Hemoglobin 12.0 - 15.0 g/dL 86.9  86.4  87.2   Hematocrit 36.0 - 46.0 % 39.4  40.3  38.0   Platelets 150 - 400 K/uL 236  200  210       Acute urinary retention : Foley catheter placed in the ED for urinary retention.   During Voiding trial bladder scan w/ ~600cc urine, patient had foley placed and 1L of urine removed  Has had UR in the past and required urethral dilation.  Will need to continue foley on discharge with outpatient foley. Patient will contact [facility] in the AM to schedule appoint.     Hyponatremia, likely hypovolemic / low solute. Resolved with IV hydration     Latest Ref Rng & Units 01/20/2024    6:47 AM 01/18/2024    7:27 AM 01/17/2024    4:39 PM  BMP  Glucose 70 - 99 mg/dL 98  93  99   BUN 6 - 20 mg/dL 5  7  8    Creatinine 0.44 - 1.00 mg/dL 9.15  9.08  9.03  Sodium 135 - 145 mmol/L 136  136  131   Potassium 3.5 - 5.1 mmol/L 4.0  4.1  3.9   Chloride 98 - 111 mmol/L 105  107  101   CO2 22 - 32 mmol/L 20  18  17    Calcium 8.9 - 10.3 mg/dL 8.5  7.7  9.0       lumbar spondylosis, radiculopathy: hx of recent L5-S1 intracept procedure (intraosseous nerve ablation) in 3/25,:  Noted, advised on avoidance of NSAIDs in setting of likely gastritis.  PCOS:  Hold metformin. Resume on discharge   Hypothyroidism:  TSH 10.2, will need repeat labs out of the acute setting. Continue levothyroxine .  Mood disorder:  Continue home lithium , clonazepam  prn.  Hold propranolol  in setting of low BP   Class 2  obesity  BMI 38.4 Complicates care    DVT prophylaxis: SCDs Code Status:Full code Family Communication: No family at bed side, patient verbalized understanding of the plan.  Disposition Plan:  Home, patient will need to schedule follow up with urology .   Status is: Inpatient Remains inpatient appropriate because: Severity of illness.   Consultants:  None  Procedures: None  Antimicrobials:  Anti-infectives (From admission, onward)    Start     Dose/Rate Route Frequency Ordered Stop   01/19/24 0000  vancomycin  (VANCOREADY) IVPB 1750 mg/350 mL  Status:  Discontinued        1,750 mg 175 mL/hr over 120 Minutes Intravenous Every 24 hours 01/17/24 2258 01/19/24 1115   01/18/24 1900  cefTRIAXone  (ROCEPHIN ) 2 g in sodium chloride  0.9 % 100 mL IVPB        2 g 200 mL/hr over 30 Minutes Intravenous Every 24 hours 01/17/24 2233     01/17/24 2300  vancomycin  (VANCOREADY) IVPB 2000 mg/400 mL        2,000 mg 200 mL/hr over 120 Minutes Intravenous  Once 01/17/24 2251 01/18/24 0337   01/17/24 1900  cefTRIAXone  (ROCEPHIN ) 2 g in sodium chloride  0.9 % 100 mL IVPB        2 g 200 mL/hr over 30 Minutes Intravenous Once 01/17/24 1856 01/17/24 2015      Subjective: Feels lower abdominal discomfort has resolved. Continues to have some L hip pain. States she has had this in the past and received a steroid injection for this.   Objective: Vitals:   01/20/24 1609 01/20/24 2004 01/21/24 0454 01/21/24 0748  BP: (!) 96/53 (!) 111/56 113/70 109/70  Pulse: 78 81 75 76  Resp: 14 17 17 16   Temp: 98.2 F (36.8 C) 99.2 F (37.3 C) 98.6 F (37 C) 98.3 F (36.8 C)  TempSrc: Oral Oral Oral Oral  SpO2: 94% 96% 97% 97%  Weight:      Height:        Intake/Output Summary (Last 24 hours) at 01/21/2024 0936 Last data filed at 01/21/2024 0816 Gross per 24 hour  Intake 340.06 ml  Output 1775 ml  Net -1434.94 ml   Filed Weights   01/17/24 1652 01/18/24 0143  Weight: 108 kg 104.9 kg    Physical Exam   Constitutional: In no distress.  Cardiovascular: Normal rate, regular rhythm. No lower extremity edema  Pulmonary: Non labored breathing on room air, no wheezing or rales.   Abdominal: Soft. Non distended and non tender Musculoskeletal: Mild TTP in anterior and posterior L hip  Neurological: Alert and oriented to person, place, and time. Non focal  Skin: Skin is warm and dry.     Data Reviewed:  I have personally reviewed following labs and imaging studies  CBC: Recent Labs  Lab 01/17/24 1639 01/18/24 0727 01/20/24 0647  WBC 14.9* 10.1 8.3  NEUTROABS 9.6*  --   --   HGB 14.9 12.7 13.5  HCT 44.7 38.0 40.3  MCV 95.1 96.9 97.1  PLT 290 210 200   Basic Metabolic Panel: Recent Labs  Lab 01/17/24 1639 01/18/24 0727 01/20/24 0647  NA 131* 136 136  K 3.9 4.1 4.0  CL 101 107 105  CO2 17* 18* 20*  GLUCOSE 99 93 98  BUN 8 7 5*  CREATININE 0.96 0.91 0.84  CALCIUM 9.0 7.7* 8.5*  MG  --  1.8 1.9  PHOS  --  4.0 5.0*   GFR: Estimated Creatinine Clearance: 105 mL/min (by C-G formula based on SCr of 0.84 mg/dL). Liver Function Tests: Recent Labs  Lab 01/17/24 1639  AST 45*  ALT 43  ALKPHOS 51  BILITOT 0.6  PROT 8.1  ALBUMIN 3.9   Recent Labs  Lab 01/17/24 1639  LIPASE 31   No results for input(s): AMMONIA in the last 168 hours. Coagulation Profile: Recent Labs  Lab 01/17/24 1917  INR 1.0   Cardiac Enzymes: No results for input(s): CKTOTAL, CKMB, CKMBINDEX, TROPONINI in the last 168 hours. BNP (last 3 results) No results for input(s): PROBNP in the last 8760 hours. HbA1C: No results for input(s): HGBA1C in the last 72 hours. CBG: No results for input(s): GLUCAP in the last 168 hours. Lipid Profile: No results for input(s): CHOL, HDL, LDLCALC, TRIG, CHOLHDL, LDLDIRECT in the last 72 hours. Thyroid Function Tests: No results for input(s): TSH, T4TOTAL, FREET4, T3FREE, THYROIDAB in the last 72 hours.  Anemia Panel: No  results for input(s): VITAMINB12, FOLATE, FERRITIN, TIBC, IRON, RETICCTPCT in the last 72 hours. Sepsis Labs: Recent Labs  Lab 01/17/24 1849 01/17/24 2104  LATICACIDVEN 2.3* 1.6    Recent Results (from the past 240 hours)  Urine Culture (for pregnant, neutropenic or urologic patients or patients with an indwelling urinary catheter)     Status: Abnormal   Collection Time: 01/17/24  5:47 PM   Specimen: Urine, Clean Catch  Result Value Ref Range Status   Specimen Description   Final    URINE, CLEAN CATCH Performed at [facility], [address], [place], [place] [address]    Special Requests   Final    NONE Performed at [facility], [address], [place], [place] [address]    Culture >=100,000 COLONIES/mL ESCHERICHIA COLI (A)  Final   Report Status 01/19/2024 FINAL  Final   Organism ID, Bacteria ESCHERICHIA COLI (A)  Final      Susceptibility   Escherichia coli - MIC*    AMPICILLIN 4 SENSITIVE Sensitive     CEFAZOLIN <=4 SENSITIVE Sensitive     CEFEPIME <=0.12 SENSITIVE Sensitive     CEFTRIAXONE  <=0.25 SENSITIVE Sensitive     CIPROFLOXACIN 0.5 INTERMEDIATE Intermediate     GENTAMICIN <=1 SENSITIVE Sensitive     IMIPENEM <=0.25 SENSITIVE Sensitive     NITROFURANTOIN <=16 SENSITIVE Sensitive     TRIMETH/SULFA <=20 SENSITIVE Sensitive     AMPICILLIN/SULBACTAM <=2 SENSITIVE Sensitive     PIP/TAZO <=4 SENSITIVE Sensitive ug/mL    * >=100,000 COLONIES/mL ESCHERICHIA COLI  Blood culture (routine x 2)     Status: None (Preliminary result)   Collection Time: 01/17/24  6:40 PM   Specimen: BLOOD LEFT WRIST  Result Value Ref Range Status   Specimen Description  Final    BLOOD LEFT WRIST Performed at [facility], [address] [place], [place] [address]    Special Requests   Final    BOTTLES DRAWN AEROBIC AND ANAEROBIC Blood Culture adequate volume Performed at [facility], [address]  [place], [place] [address]    Culture   Final    NO GROWTH 4 DAYS Performed at [facility], [address], [place], [place] [address]    Report Status PENDING  Incomplete  Wet prep, genital     Status: None   Collection Time: 01/17/24  7:24 PM   Specimen: PATH Cytology Cervicovaginal Ancillary Only  Result Value Ref Range Status   Yeast Wet Prep HPF POC NONE SEEN NONE SEEN Final   Trich, Wet Prep NONE SEEN NONE SEEN Final   Clue Cells Wet Prep HPF POC NONE SEEN NONE SEEN Final   WBC, Wet Prep HPF POC <10 <10 Final   Sperm NONE SEEN  Final    Comment: Performed at [facility], [address] [place], [place] [address]  Resp panel by RT-PCR (RSV, Flu A&B, Covid) Anterior Nasal Swab     Status: None   Collection Time: 01/17/24  7:35 PM   Specimen: Anterior Nasal Swab  Result Value Ref Range Status   SARS Coronavirus 2 by RT PCR NEGATIVE NEGATIVE Final    Comment: (NOTE) SARS-CoV-2 target nucleic acids are NOT DETECTED.  The SARS-CoV-2 RNA is generally detectable in upper respiratory specimens during the acute phase of infection. The lowest concentration of SARS-CoV-2 viral copies this assay can detect is 138 copies/mL. A negative result does not preclude SARS-Cov-2 infection and should not be used as the sole basis for treatment or other patient management decisions. A negative result may occur with  improper specimen collection/handling, submission of specimen other than nasopharyngeal swab, presence of viral mutation(s) within the areas targeted by this assay, and inadequate number of viral copies(<138 copies/mL). A negative result must be combined with clinical observations, patient history, and epidemiological information. The expected result is Negative.  Fact Sheet for Patients:  BloggerCourse.com  Fact Sheet for Healthcare Providers:  SeriousBroker.it  This test is no t yet approved or cleared by  the [place]  FDA and  has been authorized for detection and/or diagnosis of SARS-CoV-2 by FDA under an Emergency Use Authorization (EUA). This EUA will remain  in effect (meaning this test can be used) for the duration of the COVID-19 declaration under Section 564(b)(1) of the Act, 21 U.S.C.section 360bbb-3(b)(1), unless the authorization is terminated  or revoked sooner.       Influenza A by PCR NEGATIVE NEGATIVE Final   Influenza B by PCR NEGATIVE NEGATIVE Final    Comment: (NOTE) The Xpert Xpress SARS-CoV-2/FLU/RSV plus assay is intended as an aid in the diagnosis of influenza from Nasopharyngeal swab specimens and should not be used as a sole basis for treatment. Nasal washings and aspirates are unacceptable for Xpert Xpress SARS-CoV-2/FLU/RSV testing.  Fact Sheet for Patients: BloggerCourse.com  Fact Sheet for Healthcare Providers: SeriousBroker.it  This test is not yet approved or cleared by the [place]  FDA and has been authorized for detection and/or diagnosis of SARS-CoV-2 by FDA under an Emergency Use Authorization (EUA). This EUA will remain in effect (meaning this test can be used) for the duration of the COVID-19 declaration under Section 564(b)(1) of the Act, 21 U.S.C. section 360bbb-3(b)(1), unless the authorization is terminated or revoked.  Resp Syncytial Virus by PCR NEGATIVE NEGATIVE Final    Comment: (NOTE) Fact Sheet for Patients: BloggerCourse.com  Fact Sheet for Healthcare Providers: SeriousBroker.it  This test is not yet approved or cleared by the [place]  FDA and has been authorized for detection and/or diagnosis of SARS-CoV-2 by FDA under an Emergency Use Authorization (EUA). This EUA will remain in effect (meaning this test can be used) for the duration of the COVID-19 declaration under Section 564(b)(1) of the Act, 21  U.S.C. section 360bbb-3(b)(1), unless the authorization is terminated or revoked.  Performed at [facility], [address] [place], [place] [address]   Blood culture (routine x 2)     Status: None (Preliminary result)   Collection Time: 01/18/24  7:27 AM   Specimen: BLOOD RIGHT HAND  Result Value Ref Range Status   Specimen Description BLOOD RIGHT HAND  Final   Special Requests   Final    BOTTLES DRAWN AEROBIC ONLY Blood Culture results may not be optimal due to an inadequate volume of blood received in culture bottles   Culture   Final    NO GROWTH 3 DAYS Performed at [facility], [address] [place], [place] [address]    Report Status PENDING  Incomplete     Radiology Studies: No results found.  Scheduled Meds:  Chlorhexidine  Gluconate Cloth  6 each Topical Daily   FLUoxetine   40 mg Oral Daily   levothyroxine   50 mcg Oral QAC breakfast   lidocaine   1 patch Transdermal Q24H   lithium  carbonate  450 mg Oral QHS   pantoprazole   40 mg Oral Daily   sodium chloride  flush  3 mL Intravenous Q12H   Continuous Infusions:  cefTRIAXone  (ROCEPHIN )  IV Stopped (01/20/24 2038)     LOS: 4 days    Time spent: 35 mins    Alban Pepper, MD [facility]   If 7PM-7AM, please contact night-coverage

## 2024-01-21 NOTE — Plan of Care (Signed)

## 2024-01-22 ENCOUNTER — Other Ambulatory Visit (HOSPITAL_COMMUNITY): Payer: Self-pay

## 2024-01-22 DIAGNOSIS — R338 Other retention of urine: Secondary | ICD-10-CM | POA: Diagnosis not present

## 2024-01-22 DIAGNOSIS — N390 Urinary tract infection, site not specified: Secondary | ICD-10-CM | POA: Diagnosis not present

## 2024-01-22 DIAGNOSIS — A419 Sepsis, unspecified organism: Secondary | ICD-10-CM | POA: Diagnosis not present

## 2024-01-22 LAB — CULTURE, BLOOD (ROUTINE X 2)
Culture: NO GROWTH
Special Requests: ADEQUATE

## 2024-01-22 MED ORDER — CEPHALEXIN 500 MG PO CAPS
500.0000 mg | ORAL_CAPSULE | Freq: Two times a day (BID) | ORAL | 0 refills | Status: AC
  Filled 2024-01-22: qty 14, 7d supply, fill #0

## 2024-01-22 MED ORDER — ACETAMINOPHEN 500 MG PO TABS
1000.0000 mg | ORAL_TABLET | Freq: Four times a day (QID) | ORAL | 0 refills | Status: DC | PRN
  Filled 2024-01-22: qty 30, 4d supply, fill #0

## 2024-01-22 NOTE — Plan of Care (Signed)

## 2024-01-22 NOTE — Discharge Instructions (Addendum)
 Please avoid taking medications like ibuprofen as this can cause irritation to your stomach and bleeding.   Please take your antibiotics as prescribed.   Please take your senna and miralax  daily.  If you continue to see dark stools you will need to let your doctor know immediately.

## 2024-01-23 ENCOUNTER — Telehealth: Payer: PRIVATE HEALTH INSURANCE | Admitting: Physician Assistant

## 2024-01-23 DIAGNOSIS — R109 Unspecified abdominal pain: Secondary | ICD-10-CM

## 2024-01-23 DIAGNOSIS — R110 Nausea: Secondary | ICD-10-CM

## 2024-01-23 LAB — CULTURE, BLOOD (ROUTINE X 2): Culture: NO GROWTH

## 2024-01-23 NOTE — Progress Notes (Signed)
 Because of the severity of your pain and having multiple issues, I feel your condition warrants further evaluation and I recommend that you be seen in a face-to-face visit.  We cannot prescribe any narcotics or controlled substances within any department of the Virtual Urgent Care team. If you feel that you need these, a face to face with a provider is required.    NOTE: There will be NO CHARGE for this E-Visit   If you are having a true medical emergency, please call 911.     For an urgent face to face visit, [place] has multiple urgent care centers for your convenience.  Click the link below for the full list of locations and hours, walk-in wait times, appointment scheduling options and driving directions:  Urgent Care - [place], [place], [place], [place], [place], [place]  [facility]  We do apologize for the inconvenience.     Your MyChart E-visit questionnaire answers were reviewed by a board certified advanced clinical practitioner to complete your personal care plan based on your specific symptoms.    Thank you for using e-Visits.  I have spent 5 minutes in review of e-visit questionnaire, review and updating patient chart, medical decision making and response to patient.   Delon CHRISTELLA Dickinson, PA-C

## 2024-01-25 NOTE — Discharge Summary (Signed)
 Physician Discharge Summary   Patient: Amanda Rogers MRN: [id number] DOB: 1981-05-18  Admit date:     01/17/2024  Discharge date: 01/22/2024  Discharge Physician: Alban Pepper   PCP: Gladystine Erminio CROME, MD   Recommendations at discharge:    F/u with nsaid use.  F/u with urologist regarding  Repeat TFT, adjust levothyroxine    Discharge Diagnoses: Principal Problem:   Sepsis (HCC) Active Problems:   Hypovolemia   Midline low back pain   Acute urinary retention  Resolved Problems:   * No resolved hospital problems. *  Hospital Course: 43 yrs old female with PMH significant for recurrent UTI on suppressive abx, ureteral stricture with hx multiple dilations, lumbar spondylosis, radiculopathy, with hx of recent L5-S1 intracept procedure (intraosseous nerve ablation) in 3/25, PCOS, mood d/o, who presents with severe L flank and lower back pain. She reports her symptoms have been intermittent over the past 3 weeks. She describes  stabing / sharp pain in the L groin mainly. Over time has become more severe esp in the past day. She relates symptoms similar to prior UTI symptoms. Saw her PCP and was Rx'd for Ciprofloxacin for 7 day course which she finished yesterday. Despite Abx symptoms have persisted. Also has worsened midline low back pain, left upper and lateral thigh hypoesthesia, and urinary retention x 1 day. No weakness in her lower extremities. No incontinence. Otherwise has decreased PO intake, nausea, and vomiting with bilious emesis x ~ 1 week. And over past week has noted intermittent dark stools. Has been taking NSAIDs for relief of her pain. No other AC / AntiPLT.  Patient was admitted for further evaluation.   Assessment and Plan: Partially treated UTI: Sepsis with unknown source: POA, resolved  Lactic acidosis resolved : She presented with sepsis physiology ( hypotension, tachycardia, lactic acidosis, leukocytosis) She recently completed a course of ciprofloxacin for  UTI. UA not impressive for UTI on admission, rare bactereia, no pyuria, trace leukocytes. Urine culture growing E .coli, sensitivities notable for intermediate sensitivity to cipro.  CT A/P without acute findings or findings of pyelonephritis.  MRI L-spine : No evidence of lumbar spine infection, no acute or inflammatory process identified. Blood cultures no growth to date  She was continued on ceftriaxone  while inpatient and transitioned to keflex  on discharge based on her sensitives. She will discuss with her urology team about the most appropriate antibiotic for prophylaxis given she developed UTI on macrobid. Recent sensitivities show sensitivity to macrob MIC 16.      Suspect slow UGIB:  Likely gastritis, NSAID related.  She reports intermittent melena x 1 week in setting of NSAID use for symptomatic relief.  No further melena. Hbg stable this hospitalization.  Discussed importance of discontinuing NSAIDs and if needed to use with PPI for short course.    Acute urinary retention : Foley catheter placed in the ED for urinary retention.   During Voiding trial bladder scan w/ ~600cc urine, patient had foley placed and 1L of urine removed  Has had UR in the past and required urethral dilation.  Will continue foley on discharge with outpatient foley. Patient will f/u with [facility] urology 8/13.       Hyponatremia, likely hypovolemic / low solute. Resolved with IV hydration      Latest Ref Rng & Units 01/20/2024    6:47 AM 01/18/2024    7:27 AM 01/17/2024    4:39 PM  BMP  Glucose 70 - 99 mg/dL 98  93  99   BUN  6 - 20 mg/dL 5  7  8    Creatinine 0.44 - 1.00 mg/dL 9.15  9.08  9.03   Sodium 135 - 145 mmol/L 136  136  131   Potassium 3.5 - 5.1 mmol/L 4.0  4.1  3.9   Chloride 98 - 111 mmol/L 105  107  101   CO2 22 - 32 mmol/L 20  18  17    Calcium 8.9 - 10.3 mg/dL 8.5  7.7  9.0         lumbar spondylosis, radiculopathy: hx of recent L5-S1 intracept procedure (intraosseous nerve ablation)  in 3/25,:  Noted, advised on avoidance of NSAIDs in setting of likely gastritis.   PCOS:  Resumed metformin on discharge    Hypothyroidism:  TSH 10.2, will need repeat labs once further out from hospitalization. Continued on home levothyroxine .   Mood disorder:  Continue home lithium , clonazepam  prn.  Held propranolol  in setting of low BP. This was resumed on discharge.    Class 2 obesity  BMI 38.4 Complicates care        Consultants: None Procedures performed: None  Disposition: Home Diet recommendation:  Discharge Diet Orders (From admission, onward)     Start     Ordered   01/22/24 0000  Diet general        01/22/24 1356           Regular diet DISCHARGE MEDICATION: Allergies as of 01/22/2024       Reactions   Ultram [tramadol] Rash        Medication List     STOP taking these medications    aspirin EC 325 MG tablet   ibuprofen 200 MG tablet Commonly known as: ADVIL       TAKE these medications    Acetaminophen  Extra Strength 500 MG Tabs Take 2 tablets (1,000 mg total) by mouth every 6 (six) hours as needed for mild pain (pain score 1-3). Do not take more than 8 tablets in a 24 hour period.   Auvelity  45-105 MG Tbcr Generic drug: Dextromethorphan -buPROPion  ER Take 1 tablet by mouth in the morning and at bedtime. What changed: Another medication with the same name was removed. Continue taking this medication, and follow the directions you see here.   cephALEXin  500 MG capsule Commonly known as: KEFLEX  Take 1 capsule (500 mg total) by mouth 2 (two) times daily for 7 days.   clonazePAM  1 MG tablet Commonly known as: KLONOPIN  Take 1/2-1 tab po TID prn anxiety   FLUoxetine  40 MG capsule Commonly known as: PROZAC  Take 40 mg by mouth daily. What changed: Another medication with the same name was removed. Continue taking this medication, and follow the directions you see here.   levothyroxine  50 MCG tablet Commonly known as: SYNTHROID  Take  50 mcg by mouth daily before breakfast.   lithium  carbonate 150 MG capsule Take 450 mg by mouth at bedtime.   promethazine  12.5 MG tablet Commonly known as: PHENERGAN  Take 12.5 mg by mouth every 6 (six) hours as needed for nausea or vomiting.   propranolol  ER 60 MG 24 hr capsule Commonly known as: INDERAL  LA Take 60 mg by mouth in the morning and at bedtime. What changed: Another medication with the same name was removed. Continue taking this medication, and follow the directions you see here.        Follow-up Information     [facility]. Schedule an appointment as soon as possible for a visit in 1 week(s).  Specialty: Urology               Discharge Exam: Filed Weights   01/17/24 1652 01/18/24 0143  Weight: 108 kg 104.9 kg   Physical Exam  Constitutional: In no distress.  Cardiovascular: Normal rate, regular rhythm. No lower extremity edema  Pulmonary: Non labored breathing on room air, no wheezing or rales.   Abdominal: Soft. Non distended and non tender Musculoskeletal: Normal range of motion.  Some mild TTP along L hip    Neurological: Alert and oriented to person, place, and time. Non focal  Skin: Skin is warm and dry.    Condition at discharge: fair  The results of significant diagnostics from this hospitalization (including imaging, microbiology, ancillary and laboratory) are listed below for reference.   Imaging Studies: DG HIP UNILAT WITH PELVIS 2-3 VIEWS LEFT Result Date: 01/21/2024 CLINICAL DATA:  [id number] Hip pain [id number] hip EXAM: DG HIP (WITH OR WITHOUT PELVIS) 2-3V LEFT COMPARISON:  January 17, 2024 FINDINGS: No acute fracture or dislocation. L5-S1 degenerative disc disease. Soft tissues are unremarkable. IMPRESSION: No acute fracture or dislocation. Electronically Signed   By: Rogelia Myers M.D.   On: 01/21/2024 11:21   MR Lumbar Spine W Wo Contrast Result Date: 01/18/2024 CLINICAL DATA:  43 year old female with left flank pain, UTI.  EXAM: MRI LUMBAR SPINE WITHOUT AND WITH CONTRAST TECHNIQUE: Multiplanar and multiecho pulse sequences of the lumbar spine were obtained without and with intravenous contrast. CONTRAST:  10mL GADAVIST  GADOBUTROL  1 MMOL/ML IV SOLN COMPARISON:  CT Abdomen and Pelvis yesterday. Lumbar MRI 08/24/2005. FINDINGS: Segmentation:  Normal on the CT yesterday. Alignment: Similar lumbar lordosis to the 2007 MRI. Mild chronic retrolisthesis of L5 on S1. No significant scoliosis. Vertebrae: Normal background bone marrow signal. Maintained vertebral height and alignment. Small benign L5 and S1 vertebral hemangiomas, correlated on CT axial and sagittal images yesterday (series 6, images 9 and 10). No marrow edema or suspicious osseous lesion identified. Intact visible sacral ala. Conus medullaris and cauda equina: Conus extends to the L1 level. No lower spinal cord or conus signal abnormality. No abnormal intradural enhancement. Normal spinal canal patency. Unremarkable cauda equina nerve roots. Paraspinal and other soft tissues: Negative. No paraspinal inflammation or fluid collection. Disc levels: Normal for age intervertebral disc signal and morphology from T12-L1 through L4-L5. Mild superimposed facet degeneration and hypertrophy at those levels. But no associated stenosis. L5-S1: Moderate to severe disc space loss with vacuum disc by CT yesterday. Circumferential disc osteophyte complex with a broad-based posterior component. Mild to moderate facet hypertrophy. But no spinal or lateral recess stenosis. Moderate left L5 neural foraminal stenosis. Mild to moderate right L5 foraminal stenosis. IMPRESSION: 1. No evidence of lumbar spinal infection, no acute or inflammatory process identified. 2. Advanced chronic disc and endplate degeneration at L5-S1 with moderate L5 nerve level foraminal stenosis greater on the left. 3. Mild for age lumbar spine degeneration otherwise. No spinal stenosis. Electronically Signed   By: VEAR Hurst M.D.    On: 01/18/2024 04:46   CT ABDOMEN PELVIS W CONTRAST Result Date: 01/17/2024 CLINICAL DATA:  Left flank pain and history of UTI, evaluate for possible pyelonephritis EXAM: CT ABDOMEN AND PELVIS WITH CONTRAST TECHNIQUE: Multidetector CT imaging of the abdomen and pelvis was performed using the standard protocol following bolus administration of intravenous contrast. RADIATION DOSE REDUCTION: This exam was performed according to the departmental dose-optimization program which includes automated exposure control, adjustment of the mA and/or kV according to patient size and/or use of iterative  reconstruction technique. CONTRAST:  OMNIPAQUE  IOHEXOL  300 MG/ML  SOLN COMPARISON:  01/06/2017 FINDINGS: Lower chest: No acute abnormality. Hepatobiliary: Fatty infiltration of the liver is noted. The gallbladder is within normal limits. Pancreas: Unremarkable. No pancreatic ductal dilatation or surrounding inflammatory changes. Spleen: Normal in size without focal abnormality. Adrenals/Urinary Tract: Adrenal glands are within normal limits. Kidneys demonstrate a normal enhancement pattern bilaterally. Small exophytic cyst is noted posteriorly in the left kidney. No follow-up is recommended. No renal calculi or obstructive changes are noted. Ureters are within normal limits. The bladder is decompressed by Foley catheter. Stomach/Bowel: No obstructive or inflammatory changes of the colon are noted. Mild diverticular changes seen within the sigmoid colon. No diverticulitis is noted. The appendix has been surgically removed. Small bowel and stomach are within normal limits. Vascular/Lymphatic: No significant vascular findings are present. No enlarged abdominal or pelvic lymph nodes. Reproductive: Uterus and bilateral adnexa are unremarkable. Other: No abdominal wall hernia or abnormality. No abdominopelvic ascites. Musculoskeletal: No acute or significant osseous findings. IMPRESSION: Fatty liver. No findings to suggest  pyelonephritis. Diverticular change of the colon. Electronically Signed   By: Oneil Devonshire M.D.   On: 01/17/2024 21:45   DG Chest Port 1 View Result Date: 01/17/2024 CLINICAL DATA:  Sepsis EXAM: PORTABLE CHEST 1 VIEW COMPARISON:  Chest radiograph dated 08/30/2005 FINDINGS: Normal lung volumes. No focal consolidations. No pleural effusion or pneumothorax. The heart size and mediastinal contours are within normal limits. No acute osseous abnormality. IMPRESSION: No acute disease. Electronically Signed   By: Limin  Xu M.D.   On: 01/17/2024 19:20    Microbiology: Results for orders placed or performed during the hospital encounter of 01/17/24  Urine Culture (for pregnant, neutropenic or urologic patients or patients with an indwelling urinary catheter)     Status: Abnormal   Collection Time: 01/17/24  5:47 PM   Specimen: Urine, Clean Catch  Result Value Ref Range Status   Specimen Description   Final    URINE, CLEAN CATCH Performed at [facility], [address], [place], [place] [address]    Special Requests   Final    NONE Performed at [facility], [address] [place], [place] [address]    Culture >=100,000 COLONIES/mL ESCHERICHIA COLI (A)  Final   Report Status 01/19/2024 FINAL  Final   Organism ID, Bacteria ESCHERICHIA COLI (A)  Final      Susceptibility   Escherichia coli - MIC*    AMPICILLIN 4 SENSITIVE Sensitive     CEFAZOLIN <=4 SENSITIVE Sensitive     CEFEPIME <=0.12 SENSITIVE Sensitive     CEFTRIAXONE  <=0.25 SENSITIVE Sensitive     CIPROFLOXACIN 0.5 INTERMEDIATE Intermediate     GENTAMICIN <=1 SENSITIVE Sensitive     IMIPENEM <=0.25 SENSITIVE Sensitive     NITROFURANTOIN <=16 SENSITIVE Sensitive     TRIMETH/SULFA <=20 SENSITIVE Sensitive     AMPICILLIN/SULBACTAM <=2 SENSITIVE Sensitive     PIP/TAZO <=4 SENSITIVE Sensitive ug/mL    * >=100,000 COLONIES/mL ESCHERICHIA COLI  Blood culture (routine x 2)     Status: None   Collection  Time: 01/17/24  6:40 PM   Specimen: BLOOD LEFT WRIST  Result Value Ref Range Status   Specimen Description   Final    BLOOD LEFT WRIST Performed at [facility], [address], [place], [place] [address]    Special Requests   Final    BOTTLES DRAWN AEROBIC AND ANAEROBIC Blood Culture adequate volume Performed at [facility], [address] [place], [place] [address]    Culture   Final    NO GROWTH 5 DAYS Performed at [facility], [address], [place], [place] [address]    Report Status 01/22/2024 FINAL  Final  Wet prep, genital     Status: None   Collection Time: 01/17/24  7:24 PM   Specimen: PATH Cytology Cervicovaginal Ancillary Only  Result Value Ref Range Status   Yeast Wet Prep HPF POC NONE SEEN NONE SEEN Final   Trich, Wet Prep NONE SEEN NONE SEEN Final   Clue Cells Wet Prep HPF POC NONE SEEN NONE SEEN Final   WBC, Wet Prep HPF POC <10 <10 Final   Sperm NONE SEEN  Final    Comment: Performed at [facility], [address] [place], [place] [address]  Resp panel by RT-PCR (RSV, Flu A&B, Covid) Anterior Nasal Swab     Status: None   Collection Time: 01/17/24  7:35 PM   Specimen: Anterior Nasal Swab  Result Value Ref Range Status   SARS Coronavirus 2 by RT PCR NEGATIVE NEGATIVE Final    Comment: (NOTE) SARS-CoV-2 target nucleic acids are NOT DETECTED.  The SARS-CoV-2 RNA is generally detectable in upper respiratory specimens during the acute phase of infection. The lowest concentration of SARS-CoV-2 viral copies this assay can detect is 138 copies/mL. A negative result does not preclude SARS-Cov-2 infection and should not be used as the sole basis for treatment or other patient management decisions. A negative result may occur with  improper specimen collection/handling, submission of specimen other than nasopharyngeal swab, presence of viral mutation(s) within the areas targeted by this assay, and inadequate  number of viral copies(<138 copies/mL). A negative result must be combined with clinical observations, patient history, and epidemiological information. The expected result is Negative.  Fact Sheet for Patients:  BloggerCourse.com  Fact Sheet for Healthcare Providers:  SeriousBroker.it  This test is no t yet approved or cleared by the [place]  FDA and  has been authorized for detection and/or diagnosis of SARS-CoV-2 by FDA under an Emergency Use Authorization (EUA). This EUA will remain  in effect (meaning this test can be used) for the duration of the COVID-19 declaration under Section 564(b)(1) of the Act, 21 U.S.C.section 360bbb-3(b)(1), unless the authorization is terminated  or revoked sooner.       Influenza A by PCR NEGATIVE NEGATIVE Final   Influenza B by PCR NEGATIVE NEGATIVE Final    Comment: (NOTE) The Xpert Xpress SARS-CoV-2/FLU/RSV plus assay is intended as an aid in the diagnosis of influenza from Nasopharyngeal swab specimens and should not be used as a sole basis for treatment. Nasal washings and aspirates are unacceptable for Xpert Xpress SARS-CoV-2/FLU/RSV testing.  Fact Sheet for Patients: BloggerCourse.com  Fact Sheet for Healthcare Providers: SeriousBroker.it  This test is not yet approved or cleared by the [place]  FDA and has been authorized for detection and/or diagnosis of SARS-CoV-2 by FDA under an Emergency Use Authorization (EUA). This EUA will remain in effect (meaning this test can be used) for the duration of the COVID-19 declaration under Section 564(b)(1) of the Act, 21 U.S.C. section 360bbb-3(b)(1), unless the authorization is terminated or revoked.     Resp Syncytial Virus by PCR NEGATIVE NEGATIVE Final    Comment: (NOTE) Fact Sheet for Patients: BloggerCourse.com  Fact Sheet for Healthcare  Providers: SeriousBroker.it  This test is not yet approved or cleared by the [place]  FDA and  has been authorized for detection and/or diagnosis of SARS-CoV-2 by FDA under an Emergency Use Authorization (EUA). This EUA will remain in effect (meaning this test can be used) for the duration of the COVID-19 declaration under Section 564(b)(1) of the Act, 21 U.S.C. section 360bbb-3(b)(1), unless the authorization is terminated or revoked.  Performed at [facility], [address] [place], [place] [address]   Blood culture (routine x 2)     Status: None   Collection Time: 01/18/24  7:27 AM   Specimen: BLOOD RIGHT HAND  Result Value Ref Range Status   Specimen Description BLOOD RIGHT HAND  Final   Special Requests   Final    BOTTLES DRAWN AEROBIC ONLY Blood Culture results may not be optimal due to an inadequate volume of blood received in culture bottles   Culture   Final    NO GROWTH 5 DAYS Performed at [facility], [address], [place], [place] [address]    Report Status 01/23/2024 FINAL  Final    Labs: CBC: Recent Labs  Lab 01/18/24 0727 01/20/24 0647 01/21/24 1049  WBC 10.1 8.3 9.4  HGB 12.7 13.5 13.0  HCT 38.0 40.3 39.4  MCV 96.9 97.1 96.3  PLT 210 200 236   Basic Metabolic Panel: Recent Labs  Lab 01/18/24 0727 01/20/24 0647  NA 136 136  K 4.1 4.0  CL 107 105  CO2 18* 20*  GLUCOSE 93 98  BUN 7 5*  CREATININE 0.91 0.84  CALCIUM 7.7* 8.5*  MG 1.8 1.9  PHOS 4.0 5.0*   Liver Function Tests: No results for input(s): AST, ALT, ALKPHOS, BILITOT, PROT, ALBUMIN in the last 168 hours. CBG: No results for input(s): GLUCAP in the last 168 hours.  Discharge time spent: greater than 30 minutes.  Signed: Alban Pepper, MD [facility] 01/25/2024

## 2024-04-26 ENCOUNTER — Encounter (HOSPITAL_BASED_OUTPATIENT_CLINIC_OR_DEPARTMENT_OTHER): Payer: Self-pay

## 2024-04-26 ENCOUNTER — Emergency Department (HOSPITAL_BASED_OUTPATIENT_CLINIC_OR_DEPARTMENT_OTHER)
Admission: EM | Admit: 2024-04-26 | Discharge: 2024-04-27 | Disposition: A | Payer: PRIVATE HEALTH INSURANCE | Attending: Emergency Medicine | Admitting: Emergency Medicine

## 2024-04-26 ENCOUNTER — Emergency Department (HOSPITAL_BASED_OUTPATIENT_CLINIC_OR_DEPARTMENT_OTHER): Payer: PRIVATE HEALTH INSURANCE

## 2024-04-26 ENCOUNTER — Other Ambulatory Visit: Payer: Self-pay

## 2024-04-26 DIAGNOSIS — N939 Abnormal uterine and vaginal bleeding, unspecified: Secondary | ICD-10-CM

## 2024-04-26 DIAGNOSIS — N390 Urinary tract infection, site not specified: Secondary | ICD-10-CM | POA: Diagnosis not present

## 2024-04-26 DIAGNOSIS — R1032 Left lower quadrant pain: Secondary | ICD-10-CM | POA: Diagnosis present

## 2024-04-26 LAB — CBC WITH DIFFERENTIAL/PLATELET
Abs Immature Granulocytes: 0.04 K/uL (ref 0.00–0.07)
Basophils Absolute: 0.1 K/uL (ref 0.0–0.1)
Basophils Relative: 1 %
Eosinophils Absolute: 0.2 K/uL (ref 0.0–0.5)
Eosinophils Relative: 2 %
HCT: 42.9 % (ref 36.0–46.0)
Hemoglobin: 14.7 g/dL (ref 12.0–15.0)
Immature Granulocytes: 0 %
Lymphocytes Relative: 37 %
Lymphs Abs: 4.6 K/uL — ABNORMAL HIGH (ref 0.7–4.0)
MCH: 32.7 pg (ref 26.0–34.0)
MCHC: 34.3 g/dL (ref 30.0–36.0)
MCV: 95.3 fL (ref 80.0–100.0)
Monocytes Absolute: 0.7 K/uL (ref 0.1–1.0)
Monocytes Relative: 6 %
Neutro Abs: 6.9 K/uL (ref 1.7–7.7)
Neutrophils Relative %: 54 %
Platelets: 330 K/uL (ref 150–400)
RBC: 4.5 MIL/uL (ref 3.87–5.11)
RDW: 14.2 % (ref 11.5–15.5)
WBC: 12.5 K/uL — ABNORMAL HIGH (ref 4.0–10.5)
nRBC: 0 % (ref 0.0–0.2)

## 2024-04-26 LAB — BASIC METABOLIC PANEL WITH GFR
Anion gap: 14 (ref 5–15)
BUN: 6 mg/dL (ref 6–20)
CO2: 22 mmol/L (ref 22–32)
Calcium: 9.6 mg/dL (ref 8.9–10.3)
Chloride: 106 mmol/L (ref 98–111)
Creatinine, Ser: 1 mg/dL (ref 0.44–1.00)
GFR, Estimated: >60 mL/min (ref >60)
Glucose, Bld: 97 mg/dL (ref 70–99)
Potassium: 3.7 mmol/L (ref 3.5–5.1)
Sodium: 142 mmol/L (ref 135–145)

## 2024-04-26 LAB — URINALYSIS, ROUTINE W REFLEX MICROSCOPIC
Bilirubin Urine: NEGATIVE
Glucose, UA: NEGATIVE mg/dL
Ketones, ur: NEGATIVE mg/dL
Nitrite: POSITIVE — AB
Protein, ur: NEGATIVE mg/dL
Specific Gravity, Urine: 1.011 (ref 1.005–1.030)
pH: 5.5 (ref 5.0–8.0)

## 2024-04-26 LAB — HEPATIC FUNCTION PANEL
ALT: 38 U/L (ref 0–44)
AST: 30 U/L (ref 15–41)
Albumin: 4.3 g/dL (ref 3.5–5.0)
Alkaline Phosphatase: 61 U/L (ref 38–126)
Bilirubin, Direct: 0.1 mg/dL (ref 0.0–0.2)
Indirect Bilirubin: 0.2 mg/dL — ABNORMAL LOW (ref 0.3–0.9)
Total Bilirubin: 0.4 mg/dL (ref 0.0–1.2)
Total Protein: 7.4 g/dL (ref 6.5–8.1)

## 2024-04-26 LAB — PREGNANCY, URINE: Preg Test, Ur: NEGATIVE

## 2024-04-26 LAB — LACTIC ACID, PLASMA: Lactic Acid, Venous: 1.6 mmol/L (ref 0.5–1.9)

## 2024-04-26 LAB — PROTIME-INR
INR: 0.9 (ref 0.8–1.2)
Prothrombin Time: 13 s (ref 11.4–15.2)

## 2024-04-26 MED ORDER — ONDANSETRON HCL 4 MG/2ML IJ SOLN
4.0000 mg | Freq: Once | INTRAMUSCULAR | Status: AC
  Administered 2024-04-26: 4 mg via INTRAVENOUS
  Filled 2024-04-26: qty 2

## 2024-04-26 MED ORDER — SODIUM CHLORIDE 0.9 % IV BOLUS
1000.0000 mL | Freq: Once | INTRAVENOUS | Status: AC
  Administered 2024-04-26: 1000 mL via INTRAVENOUS

## 2024-04-26 MED ORDER — IOHEXOL 300 MG/ML  SOLN
100.0000 mL | Freq: Once | INTRAMUSCULAR | Status: AC | PRN
  Administered 2024-04-26: 100 mL via INTRAVENOUS

## 2024-04-26 MED ORDER — METOCLOPRAMIDE HCL 5 MG/ML IJ SOLN
10.0000 mg | Freq: Once | INTRAMUSCULAR | Status: AC
  Administered 2024-04-26: 10 mg via INTRAVENOUS
  Filled 2024-04-26: qty 2

## 2024-04-26 MED ORDER — FENTANYL CITRATE (PF) 50 MCG/ML IJ SOSY
50.0000 ug | PREFILLED_SYRINGE | INTRAMUSCULAR | Status: DC | PRN
  Administered 2024-04-26: 50 ug via INTRAVENOUS
  Filled 2024-04-26: qty 1

## 2024-04-26 MED ORDER — FENTANYL CITRATE (PF) 50 MCG/ML IJ SOSY
50.0000 ug | PREFILLED_SYRINGE | Freq: Once | INTRAMUSCULAR | Status: AC
  Administered 2024-04-26: 50 ug via INTRAVENOUS
  Filled 2024-04-26: qty 1

## 2024-04-26 NOTE — ED Provider Notes (Signed)
 [place] EMERGENCY DEPARTMENT AT [facility] Provider Note   CSN: [id number] Arrival date & time: 04/26/24  1958     Patient presents with: Pelvic Pain   Luceil A Zeiger is a 43 y.o. female who presents emergency department with a chief complaint of left lower quadrant abdominal pain.  Patient states that she has a past medical history of urinary retention, urinary infections, sepsis.  She self caths once a day to make sure she completely empties her bladder.  She reports that she has had a abnormal menstrual bleeding and has had a period for last 3 weeks nonstop states that she is soaking 2 pads an hour.  She is also complaining of progressively worsening left lower quadrant abdominal pain radiating to her back.  She has had nausea without vomiting she denies burning frequency or urgency of urination.  {Add pertinent medical, surgical, social history, OB history to HPI:32947}  Pelvic Pain       Prior to Admission medications   Medication Sig Start Date End Date Taking? Authorizing Provider  acetaminophen  (TYLENOL ) 500 MG tablet Take 2 tablets (1,000 mg total) by mouth every 6 (six) hours as needed for mild pain (pain score 1-3). Do not take more than 8 tablets in a 24 hour period. 01/22/24   Franchot Novel, MD  clonazePAM  (KLONOPIN ) 1 MG tablet Take 1/2-1 tab po TID prn anxiety 05/28/23   Franchot Harlene SQUIBB, PMHNP  Dextromethorphan -buPROPion  ER (AUVELITY ) 45-105 MG TBCR Take 1 tablet by mouth in the morning and at bedtime.    [provider]  FLUoxetine  (PROZAC ) 40 MG capsule Take 40 mg by mouth daily.    [provider]  levothyroxine  (SYNTHROID ) 50 MCG tablet Take 50 mcg by mouth daily before breakfast. 03/29/22   [provider]  lithium  carbonate 150 MG capsule Take 450 mg by mouth at bedtime.    [provider]  promethazine  (PHENERGAN ) 12.5 MG tablet Take 12.5 mg by mouth every 6 (six) hours as needed for nausea or vomiting.     [provider]  propranolol  ER (INDERAL  LA) 60 MG 24 hr capsule Take 60 mg by mouth in the morning and at bedtime.    [provider]    Allergies: Ultram [tramadol]    Review of Systems  Genitourinary:  Positive for pelvic pain.    Updated Vital Signs BP (!) 97/54   Pulse 77   Temp 98.5 F (36.9 C)   Resp 16   Ht 5' 5 (1.651 m)   Wt 98.4 kg   SpO2 91%   BMI 36.11 kg/m   Physical Exam Vitals and nursing note reviewed.  Constitutional:      General: She is not in acute distress.    Appearance: She is well-developed. She is not diaphoretic.  HENT:     Head: Normocephalic and atraumatic.     Right Ear: External ear normal.     Left Ear: External ear normal.     Nose: Nose normal.     Mouth/Throat:     Mouth: Mucous membranes are moist.  Eyes:     General: No scleral icterus.    Conjunctiva/sclera: Conjunctivae normal.  Cardiovascular:     Rate and Rhythm: Normal rate and regular rhythm.     Heart sounds: Normal heart sounds. No murmur heard.    No friction rub. No gallop.  Pulmonary:     Effort: Pulmonary effort is normal. No respiratory distress.     Breath sounds: Normal breath  sounds. No decreased air movement. No wheezing or rhonchi.  Abdominal:     General: Bowel sounds are normal. There is no distension.     Palpations: Abdomen is soft. There is no mass.     Tenderness: There is abdominal tenderness in the left lower quadrant. There is guarding.  Musculoskeletal:     Cervical back: Normal range of motion.  Skin:    General: Skin is warm and dry.  Neurological:     Mental Status: She is alert and oriented to person, place, and time.  Psychiatric:        Behavior: Behavior normal.     (all labs ordered are listed, but only abnormal results are displayed) Labs Reviewed  CBC WITH DIFFERENTIAL/PLATELET - Abnormal; Notable for the following components:      Result Value   WBC 12.5 (*)    Lymphs Abs 4.6 (*)    All other components  within normal limits  URINALYSIS, ROUTINE W REFLEX MICROSCOPIC - Abnormal; Notable for the following components:   APPearance HAZY (*)    Hgb urine dipstick SMALL (*)    Nitrite POSITIVE (*)    Leukocytes,Ua LARGE (*)    Bacteria, UA MANY (*)    All other components within normal limits  URINE CULTURE  CULTURE, BLOOD (ROUTINE X 2)  CULTURE, BLOOD (ROUTINE X 2)  PREGNANCY, URINE  BASIC METABOLIC PANEL WITH GFR  LACTIC ACID, PLASMA  LACTIC ACID, PLASMA  PROTIME-INR  HEPATIC FUNCTION PANEL    EKG: None  Radiology: No results found.  {Document cardiac monitor, telemetry assessment procedure when appropriate:32947} Procedures   Medications Ordered in the ED  sodium chloride  0.9 % bolus 1,000 mL (has no administration in time range)  fentaNYL  (SUBLIMAZE ) injection 50 mcg (has no administration in time range)  ondansetron  (ZOFRAN ) injection 4 mg (has no administration in time range)      {Click here for ABCD2, HEART and other calculators REFRESH Note before signing:1}                              Medical Decision Making Amount and/or Complexity of Data Reviewed Labs: ordered. Radiology: ordered.  Risk Prescription drug management.   ***  {Document critical care time when appropriate  Document review of labs and clinical decision tools ie CHADS2VASC2, etc  Document your independent review of radiology images and any outside records  Document your discussion with family members, caretakers and with consultants  Document social determinants of health affecting pt's care  Document your decision making why or why not admission, treatments were needed:32947:::1}   Final diagnoses:  None    ED Discharge Orders     None

## 2024-04-26 NOTE — ED Triage Notes (Signed)
 L pelvic pain radiating to flank x2 weeks. Pt states that she has been having monthly periods that lasts 3 weeks.

## 2024-04-27 ENCOUNTER — Telehealth (HOSPITAL_BASED_OUTPATIENT_CLINIC_OR_DEPARTMENT_OTHER): Payer: Self-pay | Admitting: Emergency Medicine

## 2024-04-27 ENCOUNTER — Encounter (HOSPITAL_BASED_OUTPATIENT_CLINIC_OR_DEPARTMENT_OTHER): Payer: Self-pay

## 2024-04-27 MED ORDER — CELECOXIB 200 MG PO CAPS: 200.0000 mg | ORAL_CAPSULE | Freq: Two times a day (BID) | ORAL | 0 refills | Status: DC

## 2024-04-27 MED ORDER — OXYCODONE HCL 5 MG PO TABS: 2.5000 mg | ORAL_TABLET | Freq: Four times a day (QID) | ORAL | 0 refills | Status: DC | PRN

## 2024-04-27 MED ORDER — CEPHALEXIN 500 MG PO CAPS: 500.0000 mg | ORAL_CAPSULE | Freq: Four times a day (QID) | ORAL | 0 refills | Status: DC

## 2024-04-27 MED ORDER — METOCLOPRAMIDE HCL 10 MG PO TABS: 10.0000 mg | ORAL_TABLET | Freq: Four times a day (QID) | ORAL | 0 refills | Status: DC

## 2024-04-27 MED ORDER — SODIUM CHLORIDE 0.9 % IV SOLN
1.0000 g | Freq: Once | INTRAVENOUS | Status: AC
  Administered 2024-04-27: 1 g via INTRAVENOUS
  Filled 2024-04-27: qty 10

## 2024-04-27 NOTE — Discharge Instructions (Addendum)
 Contact a health care provider if: You have bleeding that lasts for more than one week. You feel dizzy at times. You feel nauseous or you vomit. You feel light-headed or weak. You notice any other changes that show that your condition is getting worse. Contact a health care provider if: Your symptoms don't get better after 1-2 days of taking antibiotics. Your symptoms go away and then come back. You have a fever or chills. You vomit or feel like you may vomit. Get help right away if: You have very bad pain in your back or lower belly. You faint. Get help right away if: You faint. You have bleeding that soaks through a sanitary pad every hour. You have pain in the abdomen. You have a fever or chills. You become sweaty or weak. You pass large blood clots from your vagina.

## 2024-04-27 NOTE — Telephone Encounter (Cosign Needed)
 Patient seen last night, No ABX prescribed at dc. Had ROcephin  at visit and still within the 24-hour coverage.  I ordered antibiotics for treatment of UTI and sent patient a MyChart message as well as a voice message on her phone.

## 2024-04-29 ENCOUNTER — Encounter (HOSPITAL_BASED_OUTPATIENT_CLINIC_OR_DEPARTMENT_OTHER): Payer: Self-pay

## 2024-04-29 ENCOUNTER — Emergency Department (HOSPITAL_BASED_OUTPATIENT_CLINIC_OR_DEPARTMENT_OTHER): Payer: PRIVATE HEALTH INSURANCE

## 2024-04-29 ENCOUNTER — Other Ambulatory Visit: Payer: Self-pay

## 2024-04-29 ENCOUNTER — Emergency Department (HOSPITAL_BASED_OUTPATIENT_CLINIC_OR_DEPARTMENT_OTHER)
Admission: EM | Admit: 2024-04-29 | Discharge: 2024-04-29 | Disposition: A | Discharge status: Home / Self Care | Payer: PRIVATE HEALTH INSURANCE | Attending: Emergency Medicine | Admitting: Emergency Medicine

## 2024-04-29 DIAGNOSIS — N12 Tubulo-interstitial nephritis, not specified as acute or chronic: Secondary | ICD-10-CM | POA: Insufficient documentation

## 2024-04-29 DIAGNOSIS — R102 Pelvic and perineal pain unspecified side: Secondary | ICD-10-CM | POA: Diagnosis present

## 2024-04-29 LAB — URINE CULTURE: Culture: >=100000 — AB

## 2024-04-29 LAB — COMPREHENSIVE METABOLIC PANEL WITH GFR
ALT: 28 U/L (ref 0–44)
AST: 26 U/L (ref 15–41)
Albumin: 4.1 g/dL (ref 3.5–5.0)
Alkaline Phosphatase: 58 U/L (ref 38–126)
Anion gap: 12 (ref 5–15)
BUN: 8 mg/dL (ref 6–20)
CO2: 24 mmol/L (ref 22–32)
Calcium: 9.5 mg/dL (ref 8.9–10.3)
Chloride: 105 mmol/L (ref 98–111)
Creatinine, Ser: 0.96 mg/dL (ref 0.44–1.00)
GFR, Estimated: >60 mL/min (ref >60)
Glucose, Bld: 124 mg/dL — ABNORMAL HIGH (ref 70–99)
Potassium: 3.6 mmol/L (ref 3.5–5.1)
Sodium: 141 mmol/L (ref 135–145)
Total Bilirubin: 0.3 mg/dL (ref 0.0–1.2)
Total Protein: 6.9 g/dL (ref 6.5–8.1)

## 2024-04-29 LAB — URINALYSIS, ROUTINE W REFLEX MICROSCOPIC
Bilirubin Urine: NEGATIVE
Glucose, UA: NEGATIVE mg/dL
Hgb urine dipstick: NEGATIVE
Ketones, ur: NEGATIVE mg/dL
Leukocytes,Ua: NEGATIVE
Nitrite: NEGATIVE
Protein, ur: NEGATIVE mg/dL
Specific Gravity, Urine: 1.02 (ref 1.005–1.030)
pH: 5.5 (ref 5.0–8.0)

## 2024-04-29 LAB — CBC
HCT: 39 % (ref 36.0–46.0)
Hemoglobin: 13.3 g/dL (ref 12.0–15.0)
MCH: 32.7 pg (ref 26.0–34.0)
MCHC: 34.1 g/dL (ref 30.0–36.0)
MCV: 95.8 fL (ref 80.0–100.0)
Platelets: 266 K/uL (ref 150–400)
RBC: 4.07 MIL/uL (ref 3.87–5.11)
RDW: 14.3 % (ref 11.5–15.5)
WBC: 8.8 K/uL (ref 4.0–10.5)
nRBC: 0 % (ref 0.0–0.2)

## 2024-04-29 LAB — LIPASE, BLOOD: Lipase: 27 U/L (ref 11–51)

## 2024-04-29 LAB — PREGNANCY, URINE: Preg Test, Ur: NEGATIVE

## 2024-04-29 MED ORDER — SULFAMETHOXAZOLE-TRIMETHOPRIM 800-160 MG PO TABS: 1.0000 | ORAL_TABLET | Freq: Two times a day (BID) | ORAL | 0 refills | Status: AC

## 2024-04-29 MED ORDER — SODIUM CHLORIDE 0.9 % IV SOLN
12.5000 mg | Freq: Four times a day (QID) | INTRAVENOUS | Status: DC | PRN
  Administered 2024-04-29: 12.5 mg via INTRAVENOUS
  Filled 2024-04-29: qty 0.5

## 2024-04-29 MED ORDER — KETOROLAC TROMETHAMINE 15 MG/ML IJ SOLN
15.0000 mg | Freq: Once | INTRAMUSCULAR | Status: AC
  Administered 2024-04-29: 15 mg via INTRAVENOUS
  Filled 2024-04-29 (×2): qty 1

## 2024-04-29 MED ORDER — FENTANYL CITRATE (PF) 50 MCG/ML IJ SOSY
50.0000 ug | PREFILLED_SYRINGE | Freq: Once | INTRAMUSCULAR | Status: AC
  Administered 2024-04-29: 50 ug via INTRAVENOUS
  Filled 2024-04-29: qty 1

## 2024-04-29 MED ORDER — PROMETHAZINE HCL 25 MG/ML IJ SOLN
INTRAMUSCULAR | Status: AC
  Filled 2024-04-29: qty 1

## 2024-04-29 MED ORDER — SODIUM CHLORIDE 0.9 % IV SOLN
1.0000 g | Freq: Once | INTRAVENOUS | Status: AC
  Administered 2024-04-29: 1 g via INTRAVENOUS
  Filled 2024-04-29: qty 10

## 2024-04-29 NOTE — ED Triage Notes (Signed)
 Has pelvic pain, back pain, nausea, and lightheadedness for 2 months. Has OB/GYN appointment on the 11/19 but says she cannot make it so she has returned.

## 2024-04-29 NOTE — ED Provider Notes (Signed)
 [facility] EMERGENCY DEPARTMENT AT [facility] Provider Note   CSN: [id number] Arrival date & time: 04/29/24  1344     Patient presents with: Pelvic Pain, Back Pain, Nausea, and Dizziness   Amanda Rogers is a 43 y.o. female with history of IBS, urinary retention, frequent UTIs, presents with concern for pelvic pain ongoing for the past 2 months.  She reports that the pain is particularly worse in the left lower quadrant.  She reports that over the past couple of days, she has noticed some pain in her lower back as well.  Denies any fever, chills.  Does report some nausea, but no vomiting.  Reports she has been having more diarrhea recently which is nonbloody.  She normally struggles with constipation.  She also reports that she has had intermittent vaginal bleeding for the past month not consistent with her normal menstrual cycle.  No current vaginal bleeding.  She was seen in the ER for this concern 4 days ago and was diagnosed with a urinary tract infection.  She was prescribed Keflex  but did not start this medication due to her pharmacy not being able to fill the medication yet.  She did receive a CT abdomen pelvis at that time which was unremarkable.    Pelvic Pain  Back Pain Associated symptoms: pelvic pain   Dizziness      Prior to Admission medications   Medication Sig Start Date End Date Taking? Authorizing Provider  acetaminophen  (TYLENOL ) 500 MG tablet Take 2 tablets (1,000 mg total) by mouth every 6 (six) hours as needed for mild pain (pain score 1-3). Do not take more than 8 tablets in a 24 hour period. 01/22/24   Franchot Novel, MD  celecoxib (CELEBREX) 200 MG capsule Take 1 capsule (200 mg total) by mouth 2 (two) times daily. 04/27/24   Harris, Abigail, PA-C  cephALEXin  (KEFLEX ) 500 MG capsule Take 1 capsule (500 mg total) by mouth 4 (four) times daily. 04/27/24   Harris, Abigail, PA-C  clonazePAM  (KLONOPIN ) 1 MG tablet Take 1/2-1 tab po TID prn anxiety  05/28/23   Franchot Harlene SQUIBB, PMHNP  Dextromethorphan -buPROPion  ER (AUVELITY ) 45-105 MG TBCR Take 1 tablet by mouth in the morning and at bedtime.    [provider]  FLUoxetine  (PROZAC ) 40 MG capsule Take 40 mg by mouth daily.    [provider]  levothyroxine  (SYNTHROID ) 50 MCG tablet Take 50 mcg by mouth daily before breakfast. 03/29/22   [provider]  lithium  carbonate 150 MG capsule Take 450 mg by mouth at bedtime.    [provider]  metoCLOPramide  (REGLAN ) 10 MG tablet Take 1 tablet (10 mg total) by mouth every 6 (six) hours. 04/27/24   Harris, Abigail, PA-C  oxyCODONE  (ROXICODONE ) 5 MG immediate release tablet Take 0.5-1 tablets (2.5-5 mg total) by mouth every 6 (six) hours as needed for severe pain (pain score 7-10). 04/27/24   Harris, Abigail, PA-C  promethazine  (PHENERGAN ) 12.5 MG tablet Take 12.5 mg by mouth every 6 (six) hours as needed for nausea or vomiting.    [provider]  propranolol  ER (INDERAL  LA) 60 MG 24 hr capsule Take 60 mg by mouth in the morning and at bedtime.    [provider]  sulfamethoxazole-trimethoprim (BACTRIM DS) 800-160 MG tablet Take 1 tablet by mouth 2 (two) times daily for 10 days. 04/30/24 05/10/24 Yes Veta Palma, PA-C    Allergies: Ultram [tramadol]    Review of Systems  Genitourinary:  Positive for pelvic pain.  Musculoskeletal:  Positive for back pain.  Neurological:  Positive for dizziness.    Updated Vital Signs BP (!) 103/57   Pulse 70   Temp 98.7 F (37.1 C)   Resp 14   SpO2 93%   Physical Exam Vitals and nursing note reviewed.  Constitutional:      General: She is not in acute distress.    Appearance: She is well-developed.     Comments: No active vomiting  HENT:     Head: Normocephalic and atraumatic.  Eyes:     Conjunctiva/sclera: Conjunctivae normal.  Cardiovascular:     Rate and Rhythm: Normal rate and regular rhythm.     Heart sounds: No murmur  heard. Pulmonary:     Effort: Pulmonary effort is normal. No respiratory distress.     Breath sounds: Normal breath sounds.  Abdominal:     Palpations: Abdomen is soft.     Tenderness: There is abdominal tenderness.     Comments: Very mild tenderness in the left lower quadrant without rebound or guarding  Left-sided CVA tenderness.  No right-sided CVA tenderness  Musculoskeletal:        General: No swelling.     Cervical back: Neck supple.  Skin:    General: Skin is warm and dry.     Capillary Refill: Capillary refill takes less than 2 seconds.  Neurological:     Mental Status: She is alert.  Psychiatric:        Mood and Affect: Mood normal.     (all labs ordered are listed, but only abnormal results are displayed) Labs Reviewed  COMPREHENSIVE METABOLIC PANEL WITH GFR - Abnormal; Notable for the following components:      Result Value   Glucose, Bld 124 (*)    All other components within normal limits  URINALYSIS, ROUTINE W REFLEX MICROSCOPIC - Abnormal; Notable for the following components:   APPearance HAZY (*)    Bacteria, UA MANY (*)    All other components within normal limits  LIPASE, BLOOD  CBC  PREGNANCY, URINE    EKG: None  Radiology: US  PELVIC COMPLETE WITH TRANSVAGINAL Result Date: 04/29/2024 CLINICAL DATA:  Pelvic pain. EXAM: TRANSABDOMINAL AND TRANSVAGINAL ULTRASOUND OF PELVIS TECHNIQUE: Both transabdominal and transvaginal ultrasound examinations of the pelvis were performed. Transabdominal technique was performed for global imaging of the pelvis including uterus, ovaries, adnexal regions, and pelvic cul-de-sac. It was necessary to proceed with endovaginal exam following the transabdominal exam to visualize the endometrium and ovaries. COMPARISON:  CT abdomen pelvis dated 04/26/2024. FINDINGS: Uterus Measurements: 8.1 x 3.7 x 4.9 cm = volume: 77 mL. No fibroids or other mass visualized. Endometrium Thickness: 5 mm.  No focal abnormality visualized. Right  ovary Measurements: 4.7 x 2.6 x 2.6 cm = volume: 17 mL. Normal appearance/no adnexal mass. Left ovary Measurements: 4.6 x 2.8 x 2.2 cm = volume: 15 mL. Normal appearance/no adnexal mass. Other findings No abnormal free fluid. IMPRESSION: Unremarkable pelvic ultrasound. Electronically Signed   By: Vanetta Chou M.D.   On: 04/29/2024 17:26     Procedures   Medications Ordered in the ED  promethazine  (PHENERGAN ) 12.5 mg in sodium chloride  0.9 % 50 mL IVPB (0 mg Intravenous Stopped 04/29/24 1604)  ketorolac  (TORADOL ) 15 MG/ML injection 15 mg (15 mg Intravenous Not Given 04/29/24 1543)  fentaNYL  (SUBLIMAZE ) injection 50 mcg (50 mcg Intravenous Given 04/29/24 1604)  cefTRIAXone  (ROCEPHIN ) 1 g in sodium chloride  0.9 % 100 mL IVPB (0 g Intravenous Stopped 04/29/24 1710)  Clinical Course as of 04/29/24 1753  Tue Apr 29, 2024  1604 Patient with bladder scan 429, will have RN cath patient as patient usually self caths at home [AF]    Clinical Course User Index [AF] Veta Palma, PA-C                                 Medical Decision Making Amount and/or Complexity of Data Reviewed Labs: ordered. Radiology: ordered.  Risk Prescription drug management.     Differential diagnosis includes but is not limited to  acute cholecystitis, cholelithiasis, cholangitis, choledocholithiasis, peptic ulcer, gastritis, gastroenteritis, appendicitis, IBS, IBD, DKA, nephrolithiasis, UTI, pyelonephritis, pancreatitis, diverticulitis, mesenteric ischemia, abdominal aortic aneurysm, small bowel obstruction, volvulus, pregnancy related concerns in females of childbearing age    ED Course:  Upon initial evaluation, patient is very well-appearing, no acute distress.  Normal vital signs.  Abdomen is soft.  Mild tenderness in the left lower quadrant without rebound or guarding.  Reports nausea, but no active vomiting.  She request Phenergan  for nausea as she reports Zofran  does not help her.  Labs  Ordered: I Ordered, and personally interpreted labs.  The pertinent results include:   CBC within normal limits CMP with mildly elevated glucose at 124.  Otherwise, normal electrolytes.  Normal LFTs and creatinine Lipase within normal limits Urinalysis with red blood cells and white blood cells present.  Negative for nitrates and leukocytes.  Squamous epithelial cells present Pregnancy negative  Imaging Studies ordered: I ordered imaging studies including pelvic ultrasound I independently visualized the imaging with scope of interpretation limited to determining acute life threatening conditions related to emergency care. Imaging showed no acute abnromalities I agree with the radiologist interpretation  Medications Given: Phenergan  Fentanyl  Ceftriaxone  Toradol   Upon re-evaluation, patient reports pain is well controlled.  Has been able to tolerate p.o. intake.  Her workup is reassuring today.  CBC within normal limits.  No leukocytosis, fever, or tachycardia to suggest systemic infection.  CMP without any elevations in LFTs or creatinine.  Normal electrolytes.  Normal lipase.  Her transvaginal ultrasound did not reveal any abnormalities to explain her pain.  No evidence of fibroids, abscess, etc.  Her CT abdomen pelvis from 4 days ago was also reassuring without acute abnormality.  Do not feel we need to repeat a CT scan at this time given symptoms have remained the same and she has reassuring labs and vitals and no significant abdominal tenderness to palpation. Symptoms are explained by her urinalysis results as is consistent with acute cystitis/pyelonephritis.  This would explain her lower abdominal pain and flank pain.  Given the new flank pain today, we will switch her antibiotic from Keflex  to Bactrim to treat for pyelonephritis.  The E. coli that grew out on her previous urine culture was sensitive to Bactrim.  She was given a dose of ceftriaxone  here today in the emergency room.  Stable  and appropriate for discharge home.    Impression: Pyelonephritis  Disposition:  The patient was discharged home with instructions to take 10-day course of Bactrim as prescribed.  Follow-up with PCP within the next week for recheck of symptoms. Return precautions given and patient verbalized understanding.     Record Review: External records from outside source obtained and reviewed including CT abdomen pelvis from 04/26/2024 was unremarkable.  Urine culture from that visit showed E. Coli, only resistance was to ciprofloxacin.     This chart was dictated  using voice recognition software, Dragon. Despite the best efforts of this provider to proofread and correct errors, errors may still occur which can change documentation meaning.       Final diagnoses:  Pyelonephritis    ED Discharge Orders          Ordered    sulfamethoxazole-trimethoprim (BACTRIM DS) 800-160 MG tablet  2 times daily        04/29/24 1749               Amanda Mannis, PA-C 04/29/24 1753    Amanda Rogers, Amanda K, DO 04/29/24 2247

## 2024-04-29 NOTE — Discharge Instructions (Addendum)
 You were found to have a urinary tract infection that is also involving your kidneys. This is called pyelonephritis.  Your ultrasound did not reveal any abnormalities in the uterus or ovaries to explain your abdominal pain.  Your kidney, liver, pancreas, gallbladder labs are normal today.  Normal blood counts and electrolytes.  Your pregnancy test was negative.  Medications: You have been prescribed an antibiotic called trimethoprim/sulfamethoxazole (Bactrim). Take this antibiotic 2 times a day for the next 10 days.  Start this medication tomorrow morning.  Take the full course of your antibiotic even if you start feeling better. Antibiotics may cause you to have diarrhea.  You were prescribed Keflex  at your previous ER visit.  Please discontinue taking this medication and start on the Bactrim.  Follow-up instructions: Please follow-up with your primary care provider if symptoms are not improving within the next 5 days.  Return instructions:  Please return to the Emergency Department if you: Develop confusion or become poorly responsive or faint Develop a fever above 100.57F Worsening pain You have persistent vomiting and/or are unable to keep medications down Please return if you have any other emergent concerns.

## 2024-04-30 ENCOUNTER — Telehealth (HOSPITAL_BASED_OUTPATIENT_CLINIC_OR_DEPARTMENT_OTHER): Payer: Self-pay | Admitting: *Deleted

## 2024-04-30 NOTE — Telephone Encounter (Signed)
 Post ED Visit - Positive Culture Follow-up  Culture report reviewed by antimicrobial stewardship pharmacist: [facility] [x]  Maurilio Patten, Pharm.D. []  Venetia Gully, Pharm.D., BCPS AQ-ID []  Garrel Crews, Pharm.D., BCPS []  Almarie Lunger, [address].D., BCPS []  [place], [address].D., BCPS, AAHIVP []  Rosaline Bihari, Pharm.D., BCPS, AAHIVP []  Vernell Meier, PharmD, BCPS []  Latanya Hint, PharmD, BCPS []  Donald Medley, PharmD, BCPS []  Rocky Bold, PharmD []  Dorothyann Alert, PharmD, BCPS []  Morene Babe, PharmD  [facility] []  Rosaline Edison, PharmD []  Romona Bliss, PharmD []  Dolphus Roller, PharmD []  Veva Seip, Rph []  Vernell Daunt) Leonce, PharmD []  Eva Allis, PharmD []  Rosaline Millet, PharmD []  Iantha Batch, PharmD []  Arvin Gauss, PharmD []  Wanda Hasting, PharmD []  Ronal Rav, PharmD []  Rocky Slade, PharmD []  Bard Jeans, PharmD   Positive urine culture Treated with bactrim, organism sensitive to the same and no further patient follow-up is required at this time.  Lorita Barnie Pereyra 04/30/2024, 11:20 AM

## 2024-05-02 LAB — CULTURE, BLOOD (ROUTINE X 2)
Culture: NO GROWTH
Culture: NO GROWTH
Special Requests: ADEQUATE
Special Requests: ADEQUATE

## 2024-06-30 ENCOUNTER — Emergency Department (HOSPITAL_BASED_OUTPATIENT_CLINIC_OR_DEPARTMENT_OTHER): Payer: PRIVATE HEALTH INSURANCE

## 2024-06-30 ENCOUNTER — Other Ambulatory Visit (HOSPITAL_BASED_OUTPATIENT_CLINIC_OR_DEPARTMENT_OTHER): Payer: Self-pay

## 2024-06-30 ENCOUNTER — Encounter (HOSPITAL_BASED_OUTPATIENT_CLINIC_OR_DEPARTMENT_OTHER): Payer: Self-pay | Admitting: Emergency Medicine

## 2024-06-30 ENCOUNTER — Emergency Department (HOSPITAL_BASED_OUTPATIENT_CLINIC_OR_DEPARTMENT_OTHER)
Admission: EM | Admit: 2024-06-30 | Discharge: 2024-06-30 | Disposition: A | Discharge status: Home / Self Care | Payer: PRIVATE HEALTH INSURANCE

## 2024-06-30 ENCOUNTER — Other Ambulatory Visit: Payer: Self-pay

## 2024-06-30 DIAGNOSIS — E860 Dehydration: Secondary | ICD-10-CM | POA: Diagnosis not present

## 2024-06-30 DIAGNOSIS — R197 Diarrhea, unspecified: Secondary | ICD-10-CM | POA: Diagnosis present

## 2024-06-30 DIAGNOSIS — R1032 Left lower quadrant pain: Secondary | ICD-10-CM | POA: Diagnosis not present

## 2024-06-30 DIAGNOSIS — R109 Unspecified abdominal pain: Secondary | ICD-10-CM

## 2024-06-30 LAB — BASIC METABOLIC PANEL WITH GFR
Anion gap: 12 (ref 5–15)
BUN: 7 mg/dL (ref 6–20)
CO2: 22 mmol/L (ref 22–32)
Calcium: 9.7 mg/dL (ref 8.9–10.3)
Chloride: 102 mmol/L (ref 98–111)
Creatinine, Ser: 0.95 mg/dL (ref 0.44–1.00)
GFR, Estimated: >60 mL/min
Glucose, Bld: 103 mg/dL — ABNORMAL HIGH (ref 70–99)
Potassium: 4.1 mmol/L (ref 3.5–5.1)
Sodium: 137 mmol/L (ref 135–145)

## 2024-06-30 LAB — URINALYSIS, ROUTINE W REFLEX MICROSCOPIC
Bilirubin Urine: NEGATIVE
Glucose, UA: NEGATIVE mg/dL
Ketones, ur: NEGATIVE mg/dL
Nitrite: NEGATIVE
Protein, ur: NEGATIVE mg/dL
Specific Gravity, Urine: 1.015 (ref 1.005–1.030)
pH: 5.5 (ref 5.0–8.0)

## 2024-06-30 LAB — HEPATIC FUNCTION PANEL
ALT: 61 U/L — ABNORMAL HIGH (ref 0–44)
AST: 45 U/L — ABNORMAL HIGH (ref 15–41)
Albumin: 4.2 g/dL (ref 3.5–5.0)
Alkaline Phosphatase: 56 U/L (ref 38–126)
Bilirubin, Direct: 0.1 mg/dL (ref 0.0–0.2)
Indirect Bilirubin: 0.2 mg/dL — ABNORMAL LOW (ref 0.3–0.9)
Total Bilirubin: 0.4 mg/dL (ref 0.0–1.2)
Total Protein: 7.4 g/dL (ref 6.5–8.1)

## 2024-06-30 LAB — CBC WITH DIFFERENTIAL/PLATELET
Abs Immature Granulocytes: 0.03 K/uL (ref 0.00–0.07)
Basophils Absolute: 0.1 K/uL (ref 0.0–0.1)
Basophils Relative: 1 %
Eosinophils Absolute: 0.2 K/uL (ref 0.0–0.5)
Eosinophils Relative: 2 %
HCT: 41.8 % (ref 36.0–46.0)
Hemoglobin: 14.5 g/dL (ref 12.0–15.0)
Immature Granulocytes: 0 %
Lymphocytes Relative: 25 %
Lymphs Abs: 2.5 K/uL (ref 0.7–4.0)
MCH: 32.2 pg (ref 26.0–34.0)
MCHC: 34.7 g/dL (ref 30.0–36.0)
MCV: 92.9 fL (ref 80.0–100.0)
Monocytes Absolute: 0.6 K/uL (ref 0.1–1.0)
Monocytes Relative: 6 %
Neutro Abs: 6.6 K/uL (ref 1.7–7.7)
Neutrophils Relative %: 66 %
Platelets: 250 K/uL (ref 150–400)
RBC: 4.5 MIL/uL (ref 3.87–5.11)
RDW: 13.2 % (ref 11.5–15.5)
WBC: 10 K/uL (ref 4.0–10.5)
nRBC: 0 % (ref 0.0–0.2)

## 2024-06-30 LAB — LIPASE, BLOOD: Lipase: 25 U/L (ref 11–51)

## 2024-06-30 LAB — LACTIC ACID, PLASMA: Lactic Acid, Venous: 1.1 mmol/L (ref 0.5–1.9)

## 2024-06-30 LAB — PREGNANCY, URINE: Preg Test, Ur: NEGATIVE

## 2024-06-30 MED ORDER — HYDROMORPHONE HCL 1 MG/ML IJ SOLN
0.5000 mg | Freq: Once | INTRAMUSCULAR | Status: AC
  Administered 2024-06-30: 0.5 mg via INTRAVENOUS
  Filled 2024-06-30: qty 1

## 2024-06-30 MED ORDER — MORPHINE SULFATE (PF) 4 MG/ML IV SOLN
4.0000 mg | Freq: Once | INTRAVENOUS | Status: AC
  Administered 2024-06-30: 4 mg via INTRAVENOUS
  Filled 2024-06-30: qty 1

## 2024-06-30 MED ORDER — DICYCLOMINE HCL 20 MG PO TABS
20.0000 mg | ORAL_TABLET | Freq: Three times a day (TID) | ORAL | 0 refills | Status: DC | PRN
  Filled 2024-06-30: qty 21, 7d supply, fill #0

## 2024-06-30 MED ORDER — ONDANSETRON HCL 4 MG PO TABS
4.0000 mg | ORAL_TABLET | Freq: Three times a day (TID) | ORAL | 0 refills | Status: AC | PRN
  Filled 2024-06-30: qty 12, 4d supply, fill #0

## 2024-06-30 MED ORDER — IOHEXOL 300 MG/ML  SOLN
100.0000 mL | Freq: Once | INTRAMUSCULAR | Status: AC | PRN
  Administered 2024-06-30: 100 mL via INTRAVENOUS

## 2024-06-30 MED ORDER — LACTATED RINGERS IV BOLUS
1000.0000 mL | Freq: Once | INTRAVENOUS | Status: AC
  Administered 2024-06-30: 1000 mL via INTRAVENOUS

## 2024-06-30 NOTE — ED Triage Notes (Signed)
 Pt endorses chronic diarrhea x 4 weeks, c/o LLQ pain and LT flank pain x 1.5 weeks

## 2024-06-30 NOTE — ED Notes (Signed)
Pt attempted to provide stool sample with no success

## 2024-06-30 NOTE — ED Provider Notes (Signed)
 " [place] EMERGENCY DEPARTMENT AT [facility] Provider Note   CSN: [id number] Arrival date & time: 06/30/24  1001     Patient presents with: Diarrhea   Amanda Rogers is a 44 y.o. female.  {Add pertinent medical, surgical, social history, OB history to HPI:386} 44 year old female presents for evaluation of diarrhea.  She states has been going for 4 weeks but getting worse.  States she has 6 diarrhea bowel movements a day, is feeling very weak and is not tolerating much p.o.  States she has been on multiple rounds of antibiotics for recent UTIs and kidney infection.  She denies any recent travel.  Denies any other symptoms or concerns.   Diarrhea Associated symptoms: no abdominal pain, no arthralgias, no chills, no fever and no vomiting        Prior to Admission medications  Medication Sig Start Date End Date Taking? Authorizing Provider  acetaminophen  (TYLENOL ) 500 MG tablet Take 2 tablets (1,000 mg total) by mouth every 6 (six) hours as needed for mild pain (pain score 1-3). Do not take more than 8 tablets in a 24 hour period. 01/22/24   Franchot Novel, MD  celecoxib  (CELEBREX ) 200 MG capsule Take 1 capsule (200 mg total) by mouth 2 (two) times daily. 04/27/24   Harris, Abigail, PA-C  cephALEXin  (KEFLEX ) 500 MG capsule Take 1 capsule (500 mg total) by mouth 4 (four) times daily. 04/27/24   Harris, Abigail, PA-C  clonazePAM  (KLONOPIN ) 1 MG tablet Take 1/2-1 tab po TID prn anxiety 05/28/23   Franchot Harlene SQUIBB, PMHNP  Dextromethorphan -buPROPion  ER (AUVELITY ) 45-105 MG TBCR Take 1 tablet by mouth in the morning and at bedtime.    [provider]  FLUoxetine  (PROZAC ) 40 MG capsule Take 40 mg by mouth daily.    [provider]  levothyroxine  (SYNTHROID ) 50 MCG tablet Take 50 mcg by mouth daily before breakfast. 03/29/22   [provider]  lithium  carbonate 150 MG capsule Take 450 mg by mouth at bedtime.    [provider]  metoCLOPramide   (REGLAN ) 10 MG tablet Take 1 tablet (10 mg total) by mouth every 6 (six) hours. 04/27/24   Harris, Abigail, PA-C  oxyCODONE  (ROXICODONE ) 5 MG immediate release tablet Take 0.5-1 tablets (2.5-5 mg total) by mouth every 6 (six) hours as needed for severe pain (pain score 7-10). 04/27/24   Harris, Abigail, PA-C  promethazine  (PHENERGAN ) 12.5 MG tablet Take 12.5 mg by mouth every 6 (six) hours as needed for nausea or vomiting.    [provider]  propranolol  ER (INDERAL  LA) 60 MG 24 hr capsule Take 60 mg by mouth in the morning and at bedtime.    [provider]    Allergies: Ultram [tramadol]    Review of Systems  Constitutional:  Positive for fatigue. Negative for chills and fever.  HENT:  Negative for ear pain and sore throat.   Eyes:  Negative for pain and visual disturbance.  Respiratory:  Negative for cough and shortness of breath.   Cardiovascular:  Negative for chest pain and palpitations.  Gastrointestinal:  Positive for diarrhea. Negative for abdominal pain and vomiting.  Genitourinary:  Negative for dysuria and hematuria.  Musculoskeletal:  Negative for arthralgias and back pain.  Skin:  Negative for color change and rash.  Neurological:  Positive for weakness. Negative for seizures and syncope.  All other systems reviewed and are negative.   Updated Vital Signs BP (!) 109/47 (BP Location: Right Arm)   Pulse 79  Temp 98.3 F (36.8 C) (Oral)   Resp 14   Wt 95.7 kg   LMP 06/20/2024   SpO2 97%   BMI 35.11 kg/m   Physical Exam Vitals and nursing note reviewed.  Constitutional:      General: She is not in acute distress.    Appearance: Normal appearance. She is well-developed. She is not ill-appearing.  HENT:     Head: Normocephalic and atraumatic.     Mouth/Throat:     Mouth: Mucous membranes are dry.  Eyes:     Conjunctiva/sclera: Conjunctivae normal.  Cardiovascular:     Rate and Rhythm: Normal rate and regular rhythm.     Heart sounds: No murmur  heard. Pulmonary:     Effort: Pulmonary effort is normal. No respiratory distress.     Breath sounds: Normal breath sounds.  Abdominal:     Palpations: Abdomen is soft.     Tenderness: There is no abdominal tenderness.  Musculoskeletal:        General: No swelling.     Cervical back: Neck supple.  Skin:    General: Skin is warm and dry.     Capillary Refill: Capillary refill takes 2 to 3 seconds.  Neurological:     General: No focal deficit present.     Mental Status: She is alert.  Psychiatric:        Mood and Affect: Mood normal.     (all labs ordered are listed, but only abnormal results are displayed) Labs Reviewed  C DIFFICILE QUICK SCREEN W PCR REFLEX    GASTROINTESTINAL PANEL BY PCR, STOOL (REPLACES STOOL CULTURE)  BASIC METABOLIC PANEL WITH GFR  HEPATIC FUNCTION PANEL  LIPASE, BLOOD  LACTIC ACID, PLASMA  LACTIC ACID, PLASMA  CBC WITH DIFFERENTIAL/PLATELET    EKG: None  Radiology: No results found.  {Document cardiac monitor, telemetry assessment procedure when appropriate:32947} Procedures   Medications Ordered in the ED  lactated ringers  bolus 1,000 mL (has no administration in time range)  HYDROmorphone  (DILAUDID ) injection 0.5 mg (has no administration in time range)      {Click here for ABCD2, HEART and other calculators REFRESH Note before signing:1}                              Medical Decision Making Amount and/or Complexity of Data Reviewed Labs: ordered. Radiology: ordered.  Risk Prescription drug management.   ***  {Document critical care time when appropriate  Document review of labs and clinical decision tools ie CHADS2VASC2, etc  Document your independent review of radiology images and any outside records  Document your discussion with family members, caretakers and with consultants  Document social determinants of health affecting pt's care  Document your decision making why or why not admission, treatments were needed:32947:::1}    Final diagnoses:  None    ED Discharge Orders     None        "

## 2024-06-30 NOTE — Discharge Instructions (Signed)
 You can take Imodium, it is over-the-counter as needed for diarrhea.  You can alternate Tylenol  and Motrin as needed for pain.  Take your Zofran  as needed for nausea and vomiting.  A referral was placed for GI.  If you do not hear from them call the office to make an appointment.  Call your primary care doctor tomorrow to make a follow-up appointment with them and asked them to order stool studies.  Return to the ER for new or worsening symptoms.

## 2024-07-05 ENCOUNTER — Inpatient Hospital Stay (HOSPITAL_COMMUNITY)
Admission: EM | Admit: 2024-07-05 | Discharge: 2024-07-11 | DRG: 640 | Disposition: A | Discharge status: Home / Self Care | Payer: PRIVATE HEALTH INSURANCE | Attending: Family Medicine | Admitting: Family Medicine

## 2024-07-05 ENCOUNTER — Emergency Department (HOSPITAL_COMMUNITY): Payer: PRIVATE HEALTH INSURANCE

## 2024-07-05 ENCOUNTER — Encounter (HOSPITAL_COMMUNITY): Payer: Self-pay | Admitting: Emergency Medicine

## 2024-07-05 ENCOUNTER — Other Ambulatory Visit: Payer: Self-pay

## 2024-07-05 DIAGNOSIS — N135 Crossing vessel and stricture of ureter without hydronephrosis: Secondary | ICD-10-CM | POA: Diagnosis present

## 2024-07-05 DIAGNOSIS — K581 Irritable bowel syndrome with constipation: Secondary | ICD-10-CM | POA: Diagnosis present

## 2024-07-05 DIAGNOSIS — Z79899 Other long term (current) drug therapy: Secondary | ICD-10-CM

## 2024-07-05 DIAGNOSIS — R571 Hypovolemic shock: Secondary | ICD-10-CM | POA: Diagnosis not present

## 2024-07-05 DIAGNOSIS — I493 Ventricular premature depolarization: Secondary | ICD-10-CM | POA: Diagnosis not present

## 2024-07-05 DIAGNOSIS — G894 Chronic pain syndrome: Secondary | ICD-10-CM | POA: Diagnosis present

## 2024-07-05 DIAGNOSIS — Z792 Long term (current) use of antibiotics: Secondary | ICD-10-CM

## 2024-07-05 DIAGNOSIS — Z6836 Body mass index (BMI) 36.0-36.9, adult: Secondary | ICD-10-CM

## 2024-07-05 DIAGNOSIS — Z818 Family history of other mental and behavioral disorders: Secondary | ICD-10-CM

## 2024-07-05 DIAGNOSIS — N179 Acute kidney failure, unspecified: Secondary | ICD-10-CM | POA: Diagnosis present

## 2024-07-05 DIAGNOSIS — R918 Other nonspecific abnormal finding of lung field: Secondary | ICD-10-CM | POA: Diagnosis present

## 2024-07-05 DIAGNOSIS — M4726 Other spondylosis with radiculopathy, lumbar region: Secondary | ICD-10-CM | POA: Diagnosis present

## 2024-07-05 DIAGNOSIS — E860 Dehydration: Principal | ICD-10-CM | POA: Diagnosis present

## 2024-07-05 DIAGNOSIS — E872 Acidosis, unspecified: Secondary | ICD-10-CM | POA: Diagnosis present

## 2024-07-05 DIAGNOSIS — E66812 Obesity, class 2: Secondary | ICD-10-CM | POA: Diagnosis present

## 2024-07-05 DIAGNOSIS — Z885 Allergy status to narcotic agent status: Secondary | ICD-10-CM

## 2024-07-05 DIAGNOSIS — Z79890 Hormone replacement therapy (postmenopausal): Secondary | ICD-10-CM

## 2024-07-05 DIAGNOSIS — B9620 Unspecified Escherichia coli [E. coli] as the cause of diseases classified elsewhere: Secondary | ICD-10-CM | POA: Diagnosis present

## 2024-07-05 DIAGNOSIS — R32 Unspecified urinary incontinence: Secondary | ICD-10-CM | POA: Diagnosis present

## 2024-07-05 DIAGNOSIS — A09 Infectious gastroenteritis and colitis, unspecified: Secondary | ICD-10-CM | POA: Diagnosis present

## 2024-07-05 DIAGNOSIS — F410 Panic disorder [episodic paroxysmal anxiety] without agoraphobia: Secondary | ICD-10-CM | POA: Diagnosis present

## 2024-07-05 DIAGNOSIS — Z87440 Personal history of urinary (tract) infections: Secondary | ICD-10-CM

## 2024-07-05 DIAGNOSIS — K638219 Small intestinal bacterial overgrowth, unspecified: Secondary | ICD-10-CM | POA: Diagnosis present

## 2024-07-05 DIAGNOSIS — I9589 Other hypotension: Secondary | ICD-10-CM | POA: Diagnosis present

## 2024-07-05 DIAGNOSIS — R55 Syncope and collapse: Secondary | ICD-10-CM | POA: Diagnosis not present

## 2024-07-05 DIAGNOSIS — E861 Hypovolemia: Secondary | ICD-10-CM | POA: Diagnosis not present

## 2024-07-05 DIAGNOSIS — F17290 Nicotine dependence, other tobacco product, uncomplicated: Secondary | ICD-10-CM | POA: Diagnosis present

## 2024-07-05 DIAGNOSIS — F39 Unspecified mood [affective] disorder: Secondary | ICD-10-CM | POA: Diagnosis present

## 2024-07-05 DIAGNOSIS — R946 Abnormal results of thyroid function studies: Secondary | ICD-10-CM | POA: Diagnosis present

## 2024-07-05 DIAGNOSIS — R197 Diarrhea, unspecified: Principal | ICD-10-CM | POA: Diagnosis present

## 2024-07-05 LAB — CBC WITH DIFFERENTIAL/PLATELET
Abs Immature Granulocytes: 0.06 K/uL (ref 0.00–0.07)
Abs Immature Granulocytes: 0.03 K/uL (ref 0.00–0.07)
Basophils Absolute: 0.1 K/uL (ref 0.0–0.1)
Basophils Absolute: 0.1 K/uL (ref 0.0–0.1)
Basophils Relative: 1 %
Basophils Relative: 1 %
Eosinophils Absolute: 0.2 K/uL (ref 0.0–0.5)
Eosinophils Absolute: 0.1 K/uL (ref 0.0–0.5)
Eosinophils Relative: 1 %
Eosinophils Relative: 2 %
HCT: 49.8 % — ABNORMAL HIGH (ref 36.0–46.0)
HCT: 38.3 % (ref 36.0–46.0)
Hemoglobin: 16.6 g/dL — ABNORMAL HIGH (ref 12.0–15.0)
Hemoglobin: 12.7 g/dL (ref 12.0–15.0)
Immature Granulocytes: 0 %
Immature Granulocytes: 0 %
Lymphocytes Relative: 40 %
Lymphocytes Relative: 29 %
Lymphs Abs: 6.7 K/uL — ABNORMAL HIGH (ref 0.7–4.0)
Lymphs Abs: 2.5 K/uL (ref 0.7–4.0)
MCH: 31.7 pg (ref 26.0–34.0)
MCH: 32 pg (ref 26.0–34.0)
MCHC: 33.3 g/dL (ref 30.0–36.0)
MCHC: 33.2 g/dL (ref 30.0–36.0)
MCV: 95 fL (ref 80.0–100.0)
MCV: 96.5 fL (ref 80.0–100.0)
Monocytes Absolute: 0.8 K/uL (ref 0.1–1.0)
Monocytes Absolute: 0.7 K/uL (ref 0.1–1.0)
Monocytes Relative: 5 %
Monocytes Relative: 8 %
Neutro Abs: 8.9 K/uL — ABNORMAL HIGH (ref 1.7–7.7)
Neutro Abs: 5.1 K/uL (ref 1.7–7.7)
Neutrophils Relative %: 53 %
Neutrophils Relative %: 60 %
Platelets: 377 K/uL (ref 150–400)
Platelets: 207 K/uL (ref 150–400)
RBC: 5.24 MIL/uL — ABNORMAL HIGH (ref 3.87–5.11)
RBC: 3.97 MIL/uL (ref 3.87–5.11)
RDW: 13 % (ref 11.5–15.5)
RDW: 13.2 % (ref 11.5–15.5)
WBC: 16.7 K/uL — ABNORMAL HIGH (ref 4.0–10.5)
WBC: 8.5 K/uL (ref 4.0–10.5)
nRBC: 0 % (ref 0.0–0.2)
nRBC: 0 % (ref 0.0–0.2)

## 2024-07-05 LAB — COMPREHENSIVE METABOLIC PANEL WITH GFR
ALT: 57 U/L — ABNORMAL HIGH (ref 0–44)
AST: 49 U/L — ABNORMAL HIGH (ref 15–41)
Albumin: 4.9 g/dL (ref 3.5–5.0)
Alkaline Phosphatase: 65 U/L (ref 38–126)
Anion gap: 17 — ABNORMAL HIGH (ref 5–15)
BUN: 12 mg/dL (ref 6–20)
CO2: 19 mmol/L — ABNORMAL LOW (ref 22–32)
Calcium: 9.6 mg/dL (ref 8.9–10.3)
Chloride: 100 mmol/L (ref 98–111)
Creatinine, Ser: 1.17 mg/dL — ABNORMAL HIGH (ref 0.44–1.00)
GFR, Estimated: 59 mL/min — ABNORMAL LOW
Glucose, Bld: 92 mg/dL (ref 70–99)
Potassium: 4.6 mmol/L (ref 3.5–5.1)
Sodium: 137 mmol/L (ref 135–145)
Total Bilirubin: 0.5 mg/dL (ref 0.0–1.2)
Total Protein: 9 g/dL — ABNORMAL HIGH (ref 6.5–8.1)

## 2024-07-05 LAB — BASIC METABOLIC PANEL WITH GFR
Anion gap: 10 (ref 5–15)
BUN: 12 mg/dL (ref 6–20)
CO2: 21 mmol/L — ABNORMAL LOW (ref 22–32)
Calcium: 8.1 mg/dL — ABNORMAL LOW (ref 8.9–10.3)
Chloride: 105 mmol/L (ref 98–111)
Creatinine, Ser: 0.97 mg/dL (ref 0.44–1.00)
GFR, Estimated: >60 mL/min
Glucose, Bld: 96 mg/dL (ref 70–99)
Potassium: 3.9 mmol/L (ref 3.5–5.1)
Sodium: 135 mmol/L (ref 135–145)

## 2024-07-05 LAB — URINALYSIS, ROUTINE W REFLEX MICROSCOPIC
Bilirubin Urine: NEGATIVE
Glucose, UA: NEGATIVE mg/dL
Ketones, ur: NEGATIVE mg/dL
Nitrite: NEGATIVE
Protein, ur: NEGATIVE mg/dL
Specific Gravity, Urine: >1.046 — ABNORMAL HIGH (ref 1.005–1.030)
pH: 5 (ref 5.0–8.0)

## 2024-07-05 LAB — GASTROINTESTINAL PANEL BY PCR, STOOL (REPLACES STOOL CULTURE)

## 2024-07-05 LAB — C DIFFICILE QUICK SCREEN W PCR REFLEX
C Diff antigen: NEGATIVE
C Diff interpretation: NOT DETECTED
C Diff toxin: NEGATIVE

## 2024-07-05 LAB — HCG, SERUM, QUALITATIVE: Preg, Serum: NEGATIVE

## 2024-07-05 LAB — LIPASE, BLOOD: Lipase: 30 U/L (ref 11–51)

## 2024-07-05 MED ORDER — SODIUM CHLORIDE 0.9 % IV SOLN
12.5000 mg | Freq: Four times a day (QID) | INTRAVENOUS | Status: DC | PRN
  Administered 2024-07-05 – 2024-07-09 (×7): 12.5 mg via INTRAVENOUS
  Filled 2024-07-05 (×6): qty 12.5
  Filled 2024-07-05: qty 0.5
  Filled 2024-07-05: qty 12.5

## 2024-07-05 MED ORDER — ACETAMINOPHEN 650 MG RE SUPP: 650.0000 mg | Freq: Four times a day (QID) | RECTAL | Status: DC | PRN

## 2024-07-05 MED ORDER — SENNOSIDES-DOCUSATE SODIUM 8.6-50 MG PO TABS: 1.0000 | ORAL_TABLET | Freq: Every evening | ORAL | Status: DC | PRN

## 2024-07-05 MED ORDER — MORPHINE SULFATE (PF) 4 MG/ML IV SOLN
4.0000 mg | Freq: Once | INTRAVENOUS | Status: AC
  Administered 2024-07-05: 4 mg via INTRAVENOUS
  Filled 2024-07-05: qty 1

## 2024-07-05 MED ORDER — ALPRAZOLAM 0.5 MG PO TABS
1.0000 mg | ORAL_TABLET | Freq: Three times a day (TID) | ORAL | Status: DC
  Administered 2024-07-05 – 2024-07-11 (×16): 1 mg via ORAL
  Filled 2024-07-05 (×18): qty 2

## 2024-07-05 MED ORDER — KETOROLAC TROMETHAMINE 15 MG/ML IJ SOLN
15.0000 mg | Freq: Once | INTRAMUSCULAR | Status: AC
  Administered 2024-07-05: 15 mg via INTRAVENOUS
  Filled 2024-07-05: qty 1

## 2024-07-05 MED ORDER — BISACODYL 5 MG PO TBEC: 5.0000 mg | DELAYED_RELEASE_TABLET | Freq: Every day | ORAL | Status: DC | PRN

## 2024-07-05 MED ORDER — ACETAMINOPHEN 325 MG PO TABS
650.0000 mg | ORAL_TABLET | Freq: Four times a day (QID) | ORAL | Status: DC | PRN
  Administered 2024-07-05: 650 mg via ORAL
  Filled 2024-07-05: qty 2

## 2024-07-05 MED ORDER — DIPHENHYDRAMINE HCL 50 MG/ML IJ SOLN
25.0000 mg | Freq: Once | INTRAMUSCULAR | Status: AC
  Administered 2024-07-05: 25 mg via INTRAVENOUS
  Filled 2024-07-05: qty 1

## 2024-07-05 MED ORDER — SODIUM CHLORIDE 0.9 % IV BOLUS
1000.0000 mL | Freq: Once | INTRAVENOUS | Status: AC
  Administered 2024-07-05: 1000 mL via INTRAVENOUS

## 2024-07-05 MED ORDER — FLUOXETINE HCL 20 MG PO CAPS
40.0000 mg | ORAL_CAPSULE | Freq: Every day | ORAL | Status: DC
  Administered 2024-07-05 – 2024-07-11 (×7): 40 mg via ORAL
  Filled 2024-07-05 (×7): qty 2

## 2024-07-05 MED ORDER — LITHIUM CARBONATE 300 MG PO CAPS
450.0000 mg | ORAL_CAPSULE | Freq: Every day | ORAL | Status: DC
  Administered 2024-07-05 – 2024-07-10 (×6): 450 mg via ORAL
  Filled 2024-07-05 (×8): qty 1

## 2024-07-05 MED ORDER — ENOXAPARIN SODIUM 40 MG/0.4ML IJ SOSY
40.0000 mg | PREFILLED_SYRINGE | INTRAMUSCULAR | Status: DC
  Administered 2024-07-05 – 2024-07-11 (×7): 40 mg via SUBCUTANEOUS
  Filled 2024-07-05 (×7): qty 0.4

## 2024-07-05 MED ORDER — HYDROMORPHONE HCL 1 MG/ML IJ SOLN
0.5000 mg | Freq: Four times a day (QID) | INTRAMUSCULAR | Status: DC | PRN
  Administered 2024-07-05 – 2024-07-06 (×6): 0.5 mg via INTRAVENOUS
  Filled 2024-07-05: qty 1
  Filled 2024-07-05: qty 0.5
  Filled 2024-07-05 (×2): qty 1
  Filled 2024-07-05: qty 0.5
  Filled 2024-07-05: qty 1

## 2024-07-05 MED ORDER — PROCHLORPERAZINE EDISYLATE 10 MG/2ML IJ SOLN
10.0000 mg | Freq: Once | INTRAMUSCULAR | Status: AC
  Administered 2024-07-05: 10 mg via INTRAVENOUS
  Filled 2024-07-05: qty 2

## 2024-07-05 MED ORDER — ONDANSETRON HCL 4 MG/2ML IJ SOLN
4.0000 mg | Freq: Once | INTRAMUSCULAR | Status: AC
  Administered 2024-07-05: 4 mg via INTRAVENOUS
  Filled 2024-07-05: qty 2

## 2024-07-05 MED ORDER — SODIUM CHLORIDE 0.9 % IV SOLN: INTRAVENOUS | Status: AC

## 2024-07-05 MED ORDER — HYDROMORPHONE HCL 1 MG/ML IJ SOLN
1.0000 mg | Freq: Once | INTRAMUSCULAR | Status: AC
  Administered 2024-07-05: 1 mg via INTRAVENOUS
  Filled 2024-07-05: qty 1

## 2024-07-05 MED ORDER — ONDANSETRON HCL 4 MG/2ML IJ SOLN
4.0000 mg | Freq: Four times a day (QID) | INTRAMUSCULAR | Status: DC | PRN
  Administered 2024-07-05 – 2024-07-06 (×2): 4 mg via INTRAVENOUS
  Filled 2024-07-05 (×2): qty 2

## 2024-07-05 MED ORDER — ONDANSETRON HCL 4 MG PO TABS
4.0000 mg | ORAL_TABLET | Freq: Four times a day (QID) | ORAL | Status: DC | PRN
  Administered 2024-07-05 – 2024-07-06 (×2): 4 mg via ORAL
  Filled 2024-07-05 (×2): qty 1

## 2024-07-05 MED ORDER — IOHEXOL 300 MG/ML  SOLN
100.0000 mL | Freq: Once | INTRAMUSCULAR | Status: AC | PRN
  Administered 2024-07-05: 100 mL via INTRAVENOUS

## 2024-07-05 NOTE — ED Notes (Addendum)
 Pt desating while sleep, placed on 3L [place], now sating 91-92 percent, pt educated on not bending arm , fluids placed on IV pump.

## 2024-07-05 NOTE — H&P (Signed)
 " History and Physical  Amanda Rogers FMW:[id number] DOB: [birth year] DOA: 07/05/2024  PCP: [name] SAILOR, FNP   Chief Complaint: Diarrhea  HPI: Amanda Rogers is a 44 y.o. 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 Patient reports she has IBS constipation type. Over the last 4 to 5 weeks, she has had persistent diarrhea initially watery and greenish. She presented to the [facility] 5 days ago for evaluation and had a negative workup prior to discharge from the ER.  She continued to have persistent diarrhea and yesterday, she noticed her stool was dark liquid. She also reports associated dehydration, left lower abdominal pain, nausea and vomiting but denies any fevers, chills, bloody stools, constipation or dizziness.  She denies eating any unusual foods or eating at a new restaurant.  ED Course: Initial vitals show patient afebrile, soft BP with SBP in the 90-100s. Initial labs significant for creatinine 1.17, bicarb 19, anion gap 17, WBC 16.7, Hgb 16.6, negative pregnancy test, negative C. difficile screen, UA with no signs of infection. CT A/P with no acute intra-abdominal pathology.  Pt received IV NS 1 L bolus, multiple doses of IV morphine , IV Dilaudid , IV Zofran  IV Compazine  and IV Benadryl . [facility] was consulted for admission.   Review of Systems: Please see HPI for pertinent positives and negatives. A complete 10 system review of systems are otherwise negative.  Past Medical History:  Diagnosis Date   Anxiety    Back pain    IBS (irritable bowel syndrome)    Social anxiety disorder    Past Surgical History:  Procedure Laterality Date   APPENDECTOMY     CESAREAN SECTION     OVARIAN CYST REMOVAL     Social History:  reports that she has  quit smoking. She has never used smokeless tobacco. She reports current alcohol use. She reports that she does not use drugs.  Allergies[1]  Family History  Problem Relation Age of Onset   Anxiety disorder Sister    Depression Sister      Prior to Admission medications  Medication Sig Start Date End Date Taking? Authorizing Provider  acetaminophen  (TYLENOL ) 500 MG tablet Take 2 tablets (1,000 mg total) by mouth every 6 (six) hours as needed for mild pain (pain score 1-3). Do not take more than 8 tablets in a 24 hour period. 01/22/24   Franchot Novel, MD  ALPRAZolam  (XANAX ) 1 MG tablet Take 1 mg by mouth 3 (three) times daily.    [provider]  celecoxib  (CELEBREX ) 200 MG capsule Take 1 capsule (200 mg total) by mouth 2 (two) times daily. 04/27/24   Harris, Abigail, PA-C  cephALEXin  (KEFLEX ) 500 MG capsule Take 1 capsule (500 mg total) by mouth 4 (four) times daily. 04/27/24   Harris, Abigail, PA-C  clonazePAM  (KLONOPIN ) 1 MG tablet Take 1/2-1 tab po TID prn anxiety 05/28/23   Franchot Harlene SQUIBB, PMHNP  Dextromethorphan -buPROPion  ER (AUVELITY ) 45-105 MG TBCR Take 1 tablet by mouth in the morning and at bedtime.    [provider]  dicyclomine  (BENTYL ) 20 MG tablet Take 1 tablet (20 mg total) by mouth 3 (three) times daily as needed for spasms. 06/30/24   Kammerer, Megan L, DO  FLUoxetine  (PROZAC ) 40 MG capsule Take 40 mg by mouth daily.    [provider]  levothyroxine  (SYNTHROID ) 50 MCG tablet Take 50 mcg by mouth daily before breakfast. 03/29/22   [provider]  lithium  carbonate 150 MG capsule Take 450 mg by mouth at bedtime.    [provider]  metoCLOPramide  (REGLAN ) 10 MG tablet Take 1 tablet (10 mg total) by mouth every 6 (six) hours. 04/27/24   Harris, Abigail, PA-C  oxyCODONE  (ROXICODONE ) 5 MG immediate release tablet Take 0.5-1 tablets (2.5-5 mg total) by mouth every 6 (six) hours as needed for severe pain (pain score 7-10). 04/27/24    Harris, Abigail, PA-C  promethazine  (PHENERGAN ) 12.5 MG tablet Take 12.5 mg by mouth every 6 (six) hours as needed for nausea or vomiting.    [provider]  propranolol  ER (INDERAL  LA) 60 MG 24 hr capsule Take 60 mg by mouth in the morning and at bedtime.    [provider]    Physical Exam: BP (!) 89/64   Pulse 64   Temp 98 F (36.7 C) (Oral)   Resp 17   LMP 06/20/2024 (Exact Date)   SpO2 91%  General: Pleasant, weak appearing middle-age woman laying in bed. No acute distress. HEENT: [place]/AT. Anicteric sclera.  Dry mucous membrane. CV: RRR. No murmurs, rubs, or gallops. No LE edema Pulmonary: Lungs CTAB. Normal effort. No wheezing or rales. Abdominal: Soft, nondistended. Mild TTP of LLQ. Normal bowel sounds. Extremities: Palpable radial and DP pulses. Normal ROM. Skin: Warm and dry. No obvious rash or lesions. Neuro: A&Ox3. Moves all extremities. Normal sensation to light touch. No focal deficit. Psych: Normal mood and affect          Labs on Admission:  Basic Metabolic Panel: Recent Labs  Lab 06/30/24 1046 07/05/24 0126  NA 137 137  K 4.1 4.6  CL 102 100  CO2 22 19*  GLUCOSE 103* 92  BUN 7 12  CREATININE 0.95 1.17*  CALCIUM 9.7 9.6   Liver Function Tests: Recent Labs  Lab 06/30/24 1046 07/05/24 0126  AST 45* 49*  ALT 61* 57*  ALKPHOS 56 65  BILITOT 0.4 0.5  PROT 7.4 9.0*  ALBUMIN 4.2 4.9   Recent Labs  Lab 06/30/24 1046 07/05/24 0126  LIPASE 25 30   No results for input(s): AMMONIA in the last 168 hours. CBC: Recent Labs  Lab 06/30/24 1046 07/05/24 0126  WBC 10.0 16.7*  NEUTROABS 6.6 8.9*  HGB 14.5 16.6*  HCT 41.8 49.8*  MCV 92.9 95.0  PLT 250 377   Cardiac Enzymes: No results for input(s): CKTOTAL, CKMB, CKMBINDEX, TROPONINI in the last 168 hours. BNP (last 3 results) No results for input(s): BNP in the last 8760 hours.  ProBNP (last 3 results) No results for input(s): PROBNP in the last 8760  hours.  CBG: No results for input(s): GLUCAP in the last 168 hours.  Radiological Exams on Admission: CT ABDOMEN PELVIS W CONTRAST Result Date: 07/05/2024 EXAM: CT ABDOMEN AND PELVIS WITH CONTRAST 07/05/2024 03:04:48 AM TECHNIQUE: CT of the abdomen and pelvis was performed with the administration of 100 mL iohexol  (OMNIPAQUE ) 300 MG/ML solution. Multiplanar reformatted images are provided for review. Automated exposure control, iterative reconstruction, and/or weight-based adjustment of the mA/kV was utilized to reduce the radiation dose to as low as reasonably achievable. COMPARISON: None available. CLINICAL HISTORY: Diarrhea for several weeks with abdominal pain. FINDINGS: LOWER CHEST: No acute abnormality. LIVER: Fatty infiltration of the liver is noted. GALLBLADDER AND BILE DUCTS: The gallbladder is within normal limits. No biliary ductal dilatation. SPLEEN: No acute abnormality. PANCREAS:  No acute abnormality. ADRENAL GLANDS: No acute abnormality. KIDNEYS, URETERS AND BLADDER: No renal calculi or obstructive changes are seen. A posterior renal cyst is noted on the left, stable from the prior exam. The bladder is decompressed. No perinephric or periureteral stranding. GI AND BOWEL: Stomach and small bowel are within normal limits. No obstructive or inflammatory changes of the colon are seen. The appendix is not visualized, consistent with the prior surgical history. There is no bowel obstruction. PERITONEUM AND RETROPERITONEUM: No ascites. No free air. VASCULATURE: Aorta is normal in caliber. LYMPH NODES: No lymphadenopathy. REPRODUCTIVE ORGANS: The uterus is within normal limits. No adnexal masses noted. BONES AND SOFT TISSUES: No acute osseous abnormality. No focal soft tissue abnormality. IMPRESSION: 1. No acute findings in the abdomen or pelvis. Electronically signed by: Oneil Devonshire MD 07/05/2024 03:11 AM EST RP Workstation: [id number]   Assessment/Plan Amanda Rogers is a 44 y.o. 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    # Diarrhea - Patient with history of IBS constipation type presenting with persistent diarrhea over the last 4 to 5 weeks with a recent change to dark stools and associated nausea, vomiting and LLQ pain - CT A/P without acute intra-abdominal pathology, patient remains afebrile but has leukocytosis - Concern for possible infectious diarrhea versus worsening of her IBS/chronic diarrhea - C. difficile screen negative, GI panel still pending - Continue enteric precautions - GI follow-up in the outpatient  # Abdominal pain - Patient with left lower quadrant pain since yesterday, CT A/P negative for colitis or diverticulitis - Likely related to her IBS or infectious diarrhea - IV Dilaudid  as needed for pain  # Dehydration # AGMA - Patient dehydration and soft BP in the setting of persistent diarrhea - Creatinine slightly elevated to 1.17, bicarb 19, AG 17 - Give IV NS 1 L bolus followed by 100 cc/h infusion  # Mood disorder - Continue Xanax , Prozac  and lithium  - Follow-up lithium  levels  # Panic disorder - Hold propranolol  for now until further improvement in BP  DVT prophylaxis: Lovenox      Code Status: Full Code  Consults called: None  Family Communication: No family at bedside  Severity of Illness: The appropriate patient status for this patient is OBSERVATION. Observation status is judged to be reasonable and necessary in order to provide the required intensity of service to ensure the patient's safety. The patient's presenting symptoms, physical exam findings, and initial radiographic and laboratory data in the context of their medical condition is felt to place them at decreased risk for further clinical deterioration. Furthermore, it is  anticipated that the patient will be medically stable for discharge from the hospital within 2 midnights of admission.   Level of care: Med-Surg    Lou Claretta HERO, MD 07/05/2024, 8:58 AM [facility] Pager: [phone] Isaiah 41:10   If 7PM-7AM, please contact night-coverage [email or web address] Password [email or web address]     [1]  Allergies Allergen Reactions   Ultram [Tramadol] Rash   "

## 2024-07-05 NOTE — Plan of Care (Signed)

## 2024-07-05 NOTE — ED Provider Notes (Signed)
 " [facility]ERGENCY DEPT [facility] Emergency Department Provider Note MRN:  [id number]  Arrival date & time: 07/05/24     Chief Complaint   Diarrhea and Abdominal Pain   History of Present Illness   Amanda Rogers is a 44 y.o. year-old female with a history of IBS presenting to the ED with chief complaint of diarrhea.  Persistent watery diarrhea for the past 5 weeks.  Has had evaluation here in the emergency department about 5 days ago.  Pain worsening in the left lower quadrant.  Also having a change to the appearance of her diarrhea, now becoming a black fluid.  Denies fever.  Review of Systems  A thorough review of systems was obtained and all systems are negative except as noted in the HPI and PMH.   Patient's Health History    Past Medical History:  Diagnosis Date   Anxiety    Back pain    IBS (irritable bowel syndrome)    Social anxiety disorder     Past Surgical History:  Procedure Laterality Date   APPENDECTOMY     CESAREAN SECTION     OVARIAN CYST REMOVAL      Family History  Problem Relation Age of Onset   Anxiety disorder Sister    Depression Sister     Social History   Socioeconomic History   Marital status: Married    Spouse name: Not on file   Number of children: Not on file   Years of education: Not on file   Highest education level: Not on file  Occupational History   Not on file  Tobacco Use   Smoking status: Former   Smokeless tobacco: Never  Vaping Use   Vaping status: Every Day  Substance and Sexual Activity   Alcohol use: Yes    Comment: occ   Drug use: No   Sexual activity: Yes    Birth control/protection: Pill  Other Topics Concern   Not on file  Social History Narrative   Not on file   Social Drivers of Health   Tobacco Use: Medium Risk (07/05/2024)   Patient History    Smoking Tobacco Use: Former    Smokeless Tobacco Use: Never    Passive Exposure: Not on file  Financial Resource Strain: Low Risk (01/09/2024)    Received from [facility]   Overall Financial Resource Strain (CARDIA)    How hard is it for you to pay for the very basics like food, housing, medical care, and heating?: Not hard at all  Food Insecurity: No Food Insecurity (01/18/2024)   Epic    Worried About Radiation Protection Practitioner of Food in the Last Year: Never true    Ran Out of Food in the Last Year: Never true  Transportation Needs: No Transportation Needs (01/18/2024)   Epic    Lack of Transportation (Medical): No    Lack of Transportation (Non-Medical): No  Physical Activity: Insufficiently Active (01/09/2024)   Received from [facility]   Exercise Vital Sign    On average, how many days per week do you engage in moderate to strenuous exercise (like a brisk walk)?: 2 days    On average, how many minutes do you engage in exercise at this level?: 30 min  Stress: No Stress Concern Present (01/09/2024)   Received from [facility] - Occupational Stress Questionnaire    Do you feel stress - tense, restless, nervous, or anxious, or unable to sleep at night because your  mind is troubled all the time - these days?: Not at all  Social Connections: Socially Integrated (01/09/2024)   Received from [facility]   Social Network    How would you rate your social network (family, work, friends)?: Good participation with social networks  Intimate Partner Violence: Not At Risk (01/18/2024)   Epic    Fear of Current or Ex-Partner: No    Emotionally Abused: No    Physically Abused: No    Sexually Abused: No  Depression (PHQ2-9): Not on file  Alcohol Screen: Not on file  Housing: Low Risk (01/18/2024)   Epic    Unable to Pay for Housing in the Last Year: No    Number of Times Moved in the Last Year: 0    Homeless in the Last Year: No  Utilities: Not At Risk (01/18/2024)   Epic    Threatened with loss of utilities: No  Health Literacy: Not on file     Physical Exam   Vitals:   07/05/24 0055 07/05/24 0412   BP: 103/69 103/62  Pulse: 82 66  Resp: 16 19  Temp: 98.2 F (36.8 C) 98 F (36.7 C)  SpO2: 94% 94%    CONSTITUTIONAL: Well-appearing, NAD NEURO/PSYCH:  Alert and oriented x 3, no focal deficits EYES:  eyes equal and reactive ENT/NECK:  no LAD, no JVD CARDIO: Regular rate, well-perfused, normal S1 and S2 PULM:  CTAB no wheezing or rhonchi GI/GU:  non-distended, non-tender MSK/SPINE:  No gross deformities, no edema SKIN:  no rash, atraumatic   *Additional and/or pertinent findings included in MDM below  Diagnostic and Interventional Summary    EKG Interpretation Date/Time:    Ventricular Rate:    PR Interval:    QRS Duration:    QT Interval:    QTC Calculation:   R Axis:      Text Interpretation:         Labs Reviewed  CBC WITH DIFFERENTIAL/PLATELET - Abnormal; Notable for the following components:      Result Value   WBC 16.7 (*)    RBC 5.24 (*)    Hemoglobin 16.6 (*)    HCT 49.8 (*)    All other components within normal limits  COMPREHENSIVE METABOLIC PANEL WITH GFR - Abnormal; Notable for the following components:   CO2 19 (*)    Creatinine, Ser 1.17 (*)    Total Protein 9.0 (*)    AST 49 (*)    ALT 57 (*)    GFR, Estimated 59 (*)    Anion gap 17 (*)    All other components within normal limits  C DIFFICILE QUICK SCREEN W PCR REFLEX    GASTROINTESTINAL PANEL BY PCR, STOOL (REPLACES STOOL CULTURE)  LIPASE, BLOOD  HCG, SERUM, QUALITATIVE  URINALYSIS, ROUTINE W REFLEX MICROSCOPIC    CT ABDOMEN PELVIS W CONTRAST  Final Result      Medications  HYDROmorphone  (DILAUDID ) injection 1 mg (has no administration in time range)  sodium chloride  0.9 % bolus 1,000 mL (1,000 mLs Intravenous New Bag/Given 07/05/24 0247)  morphine  (PF) 4 MG/ML injection 4 mg (4 mg Intravenous Given 07/05/24 0247)  ondansetron  (ZOFRAN ) injection 4 mg (4 mg Intravenous Given 07/05/24 0247)  iohexol  (OMNIPAQUE ) 300 MG/ML solution 100 mL (100 mLs Intravenous Contrast Given 07/05/24  0301)  morphine  (PF) 4 MG/ML injection 4 mg (4 mg Intravenous Given 07/05/24 0358)  ondansetron  (ZOFRAN ) injection 4 mg (4 mg Intravenous Given 07/05/24 0358)     Procedures  /  Critical  Care Procedures  ED Course and Medical Decision Making  Initial Impression and Ddx Differential diagnosis includes C. difficile, upper GI bleeding, infectious diarrhea, diverticulitis.  Past medical/surgical history that increases complexity of ED encounter: None  Interpretation of Diagnostics I personally reviewed the Laboratory Testing and my interpretation is as follows: Leukocytosis noted  CT unremarkable  Patient Reassessment and Ultimate Disposition/Management     Patient with continued pain and nausea, continued diarrhea, dehydrated, requesting hospitalist admission.  Patient management required discussion with the following services or consulting groups:  Hospitalist Service  Complexity of Problems Addressed Acute illness or injury that poses threat of life of bodily function  Additional Data Reviewed and Analyzed Further history obtained from: Prior labs/imaging results  Additional Factors Impacting ED Encounter Risk Consideration of hospitalization  Ozell HERO. Theadore, MD [facility] Emergency Medicine [facility] [email or web address]  Final Clinical Impressions(s) / ED Diagnoses     ICD-10-CM   1. Diarrhea of presumed infectious origin  R19.7     2. Dehydration  E86.0       ED Discharge Orders     None        Discharge Instructions Discussed with and Provided to Patient:   Discharge Instructions   None      Theadore Ozell HERO, MD 07/05/24 0518  "

## 2024-07-05 NOTE — ED Triage Notes (Signed)
" °  Patient comes in with diarrhea that has been going on for about 6 weeks.  Patient states she was on several courses of antibiotics and this was a side effect that has not went way.  Was seen on 1/12 for same and given fluids.  Attempted to follow up with [facility] but could not see her until late February.  Patient endorses 6 episodes of diarrhea that are dark brown and watery.  Endorses abdominal cramping, and nausea.  Pain 7/10, cramping.  Took bentyl  around 2130.   "

## 2024-07-06 ENCOUNTER — Observation Stay (HOSPITAL_COMMUNITY): Payer: PRIVATE HEALTH INSURANCE

## 2024-07-06 DIAGNOSIS — G894 Chronic pain syndrome: Secondary | ICD-10-CM

## 2024-07-06 DIAGNOSIS — R578 Other shock: Secondary | ICD-10-CM | POA: Diagnosis not present

## 2024-07-06 DIAGNOSIS — N179 Acute kidney failure, unspecified: Secondary | ICD-10-CM

## 2024-07-06 DIAGNOSIS — F39 Unspecified mood [affective] disorder: Secondary | ICD-10-CM

## 2024-07-06 DIAGNOSIS — R197 Diarrhea, unspecified: Secondary | ICD-10-CM

## 2024-07-06 DIAGNOSIS — K580 Irritable bowel syndrome with diarrhea: Secondary | ICD-10-CM

## 2024-07-06 DIAGNOSIS — E861 Hypovolemia: Secondary | ICD-10-CM

## 2024-07-06 DIAGNOSIS — Z87440 Personal history of urinary (tract) infections: Secondary | ICD-10-CM

## 2024-07-06 DIAGNOSIS — R579 Shock, unspecified: Secondary | ICD-10-CM

## 2024-07-06 LAB — ECHOCARDIOGRAM COMPLETE
AR max vel: 2.39 cm2
AV Area VTI: 2.57 cm2
AV Area mean vel: 2.36 cm2
AV Mean grad: 3 mmHg
AV Peak grad: 7.2 mmHg
Ao pk vel: 1.34 m/s
Area-P 1/2: 4 cm2
Calc EF: 67.2 %
Height: 65 in
S' Lateral: 2.9 cm
Single Plane A2C EF: 69.5 %
Single Plane A4C EF: 66.6 %
Weight: 3509.72 [oz_av]

## 2024-07-06 LAB — COMPREHENSIVE METABOLIC PANEL WITH GFR
ALT: 44 U/L (ref 0–44)
AST: 38 U/L (ref 15–41)
Albumin: 3.5 g/dL (ref 3.5–5.0)
Alkaline Phosphatase: 43 U/L (ref 38–126)
Anion gap: 10 (ref 5–15)
BUN: 11 mg/dL (ref 6–20)
CO2: 19 mmol/L — ABNORMAL LOW (ref 22–32)
Calcium: 8.1 mg/dL — ABNORMAL LOW (ref 8.9–10.3)
Chloride: 106 mmol/L (ref 98–111)
Creatinine, Ser: 1.04 mg/dL — ABNORMAL HIGH (ref 0.44–1.00)
GFR, Estimated: >60 mL/min
Glucose, Bld: 115 mg/dL — ABNORMAL HIGH (ref 70–99)
Potassium: 3.7 mmol/L (ref 3.5–5.1)
Sodium: 134 mmol/L — ABNORMAL LOW (ref 135–145)
Total Bilirubin: 0.2 mg/dL (ref 0.0–1.2)
Total Protein: 5.9 g/dL — ABNORMAL LOW (ref 6.5–8.1)

## 2024-07-06 LAB — CBC
HCT: 37.4 % (ref 36.0–46.0)
Hemoglobin: 12.4 g/dL (ref 12.0–15.0)
MCH: 32 pg (ref 26.0–34.0)
MCHC: 33.2 g/dL (ref 30.0–36.0)
MCV: 96.4 fL (ref 80.0–100.0)
Platelets: 213 K/uL (ref 150–400)
RBC: 3.88 MIL/uL (ref 3.87–5.11)
RDW: 13.1 % (ref 11.5–15.5)
WBC: 8.2 K/uL (ref 4.0–10.5)
nRBC: 0 % (ref 0.0–0.2)

## 2024-07-06 LAB — TROPONIN T, HIGH SENSITIVITY
Troponin T High Sensitivity: <15 ng/L (ref 0–19)
Troponin T High Sensitivity: <15 ng/L (ref 0–19)

## 2024-07-06 LAB — CORTISOL: Cortisol, Plasma: 1.1 ug/dL

## 2024-07-06 LAB — T4, FREE: Free T4: 1.09 ng/dL (ref 0.80–2.00)

## 2024-07-06 LAB — TSH: TSH: 9.8 u[IU]/mL — ABNORMAL HIGH (ref 0.350–4.500)

## 2024-07-06 LAB — D-DIMER, QUANTITATIVE: D-Dimer, Quant: <0.27 ug{FEU}/mL (ref 0.00–0.50)

## 2024-07-06 LAB — LACTIC ACID, PLASMA
Lactic Acid, Venous: 1.5 mmol/L (ref 0.5–1.9)
Lactic Acid, Venous: 1.1 mmol/L (ref 0.5–1.9)

## 2024-07-06 LAB — CK: Total CK: 43 U/L (ref 38–234)

## 2024-07-06 LAB — LITHIUM LEVEL: Lithium Lvl: 0.47 mmol/L — ABNORMAL LOW (ref 0.60–1.20)

## 2024-07-06 MED ORDER — PIPERACILLIN-TAZOBACTAM 3.375 G IVPB
3.3750 g | Freq: Three times a day (TID) | INTRAVENOUS | Status: DC
  Administered 2024-07-06 – 2024-07-07 (×4): 3.375 g via INTRAVENOUS
  Filled 2024-07-06 (×4): qty 50

## 2024-07-06 MED ORDER — LACTATED RINGERS IV SOLN: INTRAVENOUS | Status: AC

## 2024-07-06 MED ORDER — SODIUM CHLORIDE 0.9 % IV BOLUS
1000.0000 mL | Freq: Once | INTRAVENOUS | Status: AC
  Administered 2024-07-06: 1000 mL via INTRAVENOUS

## 2024-07-06 MED ORDER — CHLORHEXIDINE GLUCONATE CLOTH 2 % EX PADS
6.0000 | MEDICATED_PAD | Freq: Every day | CUTANEOUS | Status: DC
  Administered 2024-07-06 – 2024-07-08 (×3): 6 via TOPICAL

## 2024-07-06 MED ORDER — LIDOCAINE 5 % EX PTCH
1.0000 | MEDICATED_PATCH | CUTANEOUS | Status: DC
  Administered 2024-07-06 – 2024-07-08 (×3): 1 via TRANSDERMAL
  Filled 2024-07-06 (×3): qty 1

## 2024-07-06 MED ORDER — NOREPINEPHRINE 4 MG/250ML-% IV SOLN
0.0000 ug/min | INTRAVENOUS | Status: DC
  Administered 2024-07-06: 2 ug/min via INTRAVENOUS

## 2024-07-06 MED ORDER — NOREPINEPHRINE 4 MG/250ML-% IV SOLN
INTRAVENOUS | Status: AC
  Administered 2024-07-06: 2 ug/min via INTRAVENOUS
  Filled 2024-07-06: qty 250

## 2024-07-06 MED ORDER — HYDROMORPHONE HCL 1 MG/ML IJ SOLN
0.5000 mg | INTRAMUSCULAR | Status: DC | PRN
  Administered 2024-07-06 – 2024-07-07 (×2): 0.5 mg via INTRAVENOUS
  Filled 2024-07-06: qty 1

## 2024-07-06 NOTE — Progress Notes (Signed)
 " [facility] Progress Note  Patient: Amanda Rogers     FMW:[id number]  DOA: 07/05/2024   PCP: [name] SAILOR, FNP       Brief hospital course: 44 y/o F with ureteral stricture requiring repeated dilatations,  recurrent UTIs on suppressive Abx, lumbar spondylosis, radiculopathy, chronic pain, IBS, panic and mood disorder presented to ED for 4 wks of diarrhea LLQ pain. 5 days prior, she presented to the ED and had a neg work up and was discharged. Her diarrhea has gotten worse and she's been having at least 10 BMs a day, non bloody. She has incontinence occasionally. In her 15s she had constipation and required Linzess.   In ED : BP as low as 87/60 with MAP of 70  Bicarb 19, Cr 1.17 (baseline ~0.95) WBC 16.7 AST 49 ALT 57  UA: small leukocytes, U sp gravity > 1.046, 6-10 RBC and rare bacteria  C diff neg  Zosyn  started, PCCM consulted and Levophed  started  Subjective:  No stools since yesterday. LLQ feels bloated.   Assessment and Plan: Principal Problem:   Diarrhea with LLQ pain - has had this issue before and last saw GI in 2023- has had a colonoscopy and biopsies- has been treated for overflow incontinence - no h/o celiac disease - WBC count improved from 16.7 to 8.5 prior to receiving Zosyn  - continues to have some LLQ pain radiating to the back - appears to have slowed down for now - the patient thinks this may be the result of narcotics - stool for c diff and GI pathogen panel neg -  check for ova-parasites - tolerating regular diet   Active Problems:   Hypotension due to hypovolemia with shock - has been weaned off of Levophed  - Propanolol on hold    Dehydration - cont LR today    Mood disorder - on lithium  - level was low at 0.47      Code Status: Full Code Total time on patient care: 35 min DVT prophylaxis:  enoxaparin  (LOVENOX ) injection 40 mg Start: 07/05/24 1000  Objective:   Vitals:   07/06/24 0500 07/06/24 0600 07/06/24 0615 07/06/24  0700  BP: 118/74 107/71 113/73 102/70  Pulse: 70 71 81 75  Resp: 12 13 15 12   Temp:    98.2 F (36.8 C)  TempSrc:    Oral  SpO2: 92% 94% 96% 96%  Weight:      Height:       Filed Weights   07/06/24 0400  Weight: 99.5 kg   Exam: General exam: Appears comfortable  HEENT: oral mucosa moist Respiratory system: Clear to auscultation.  Cardiovascular system: S1 & S2 heard  Gastrointestinal system: Abdomen soft, left LQ, mid abdomen tender nondistended. Normal bowel sounds   Extremities: No cyanosis, clubbing or edema Psychiatry:  Mood & affect appropriate.      CBC: Recent Labs  Lab 06/30/24 1046 07/05/24 0126 07/05/24 2316 07/06/24 0258  WBC 10.0 16.7* 8.5 8.2  NEUTROABS 6.6 8.9* 5.1  --   HGB 14.5 16.6* 12.7 12.4  HCT 41.8 49.8* 38.3 37.4  MCV 92.9 95.0 96.5 96.4  PLT 250 377 207 213   Basic Metabolic Panel: Recent Labs  Lab 06/30/24 1046 07/05/24 0126 07/05/24 2316 07/06/24 0258  NA 137 137 135 134*  K 4.1 4.6 3.9 3.7  CL 102 100 105 106  CO2 22 19* 21* 19*  GLUCOSE 103* 92 96 115*  BUN 7 12 12 11   CREATININE 0.95 1.17* 0.97 1.04*  CALCIUM 9.7 9.6 8.1* 8.1*     Scheduled Meds:  ALPRAZolam   1 mg Oral TID   Chlorhexidine  Gluconate Cloth  6 each Topical Daily   enoxaparin  (LOVENOX ) injection  40 mg Subcutaneous Q24H   FLUoxetine   40 mg Oral Daily   lithium  carbonate  450 mg Oral QHS    Imaging and lab data personally reviewed   Author: Graves Nipp  07/06/2024 7:35 AM  To contact [facility]>   Check the care team in [facility] and look for the attending/consulting [facility] provider listed  Log into [email or web address] and use [place]'s universal password   Go to> [facility]  and find provider  If you still have difficulty reaching the provider, please page the [facility] (Director on Call) for the Hospitalists listed on amion     "

## 2024-07-06 NOTE — Progress Notes (Addendum)
 Patient complains of Left lower back/flank pain and nausea. RN administered prn tylenol  for the pain but she received zofran  at 1730 and has nothing else available to administer for nausea.   RN notified Isaiah lever, NP   Orders Received: 12.5mg  IV Phenergan 

## 2024-07-06 NOTE — Progress Notes (Signed)
 Rapid Response Event Note   Reason for Call : pt has low b/p and feeling like she needs to close her eyes per pt primary RN.   Initial Focused Assessment: Pt Alert and Oriented and able to follow commands.  Pt c/o LLQ pain in her ABD.  Rated 8/10.   ABD soft with active bowel sounds x 4.  See flow sheet for VS.  Pt has soft b/p and pt is currently receiving a NS Bolus IV.  Lung sounds clear and decreased pt on RA.    Interventions: [facility] , NP at bedside, new orders received and initiated.      Plan of Care: Pt will transfer to ICU/SD for closer monitoring.   Event Summary:   MD Notified: yes Call Time:0221 Arrival Time: 0223 End Time: 0300  Artem Bunte Lavern, RN

## 2024-07-06 NOTE — Progress Notes (Signed)
"   Patient's mother called this RN back. RN explained that the patient was moved to the Step down unit for closer monitoring due to her low BP. "

## 2024-07-06 NOTE — Progress Notes (Signed)
 RN called and left a HIPAA appropriate voicemail for the patient's mother per the patient's request.

## 2024-07-06 NOTE — Consult Note (Addendum)
 "  NAME:  Amanda Rogers, MRN:  [id number], DOB:  08/31/1980, LOS: 0 ADMISSION DATE:  07/05/2024, CONSULTATION DATE:  07/06/2024 REFERRING MD:  Alto Rake, NP, CHIEF COMPLAINT:  diarrhoea   History of Present Illness:  44 y.o. female with medical history significant for recurrent UTI on suppressive abx, ureteral stricture with hx multiple dilations, lumbar spondylosis, radiculopathy, chronic pain syndrome, PCOS, panic disorder, mood disorder and IBS who was admitted to [facility] hospital for persistent diarrhea on 07/05/2024 and abd pain. CT A/P negative for colitis or diverticulitis. Needed dilaudid  and toradol  overnight for left lower lobe quadrant abd pain. Despite 2 L fluid resuscitation, SBP was in 70s. Hence she was transferred to ICU and CC consulted.  Pertinent  Medical History  Recurrent UTI Radiculopathy Lumbar spondylosis Chronic pain syndrome Mood disorder  Significant Hospital Events: Including procedures, antibiotic start and stop dates in addition to other pertinent events   06/27/2023: transferred to ICU , started on levophed  2  Interim History / Subjective:  Pt complains of mild lower abd pain Had 5-6 episodes of diarrhoea yesterday  Objective    Blood pressure 120/71, pulse 74, temperature 98.5 F (36.9 C), temperature source Oral, resp. rate 13, height 5' 5 (1.651 m), weight 99.5 kg, last menstrual period 06/20/2024, SpO2 91%.        Intake/Output Summary (Last 24 hours) at 07/06/2024 9491 Last data filed at 07/06/2024 0448 Gross per 24 hour  Intake 4498.07 ml  Output 140 ml  Net 4358.07 ml   Filed Weights   07/06/24 0400  Weight: 99.5 kg    Examination: General: awake, conversant HENT: within normal limits  Lungs: b/l clear Cardiovascular: s1,s2,Mo Abdomen: soft, mild tenderness in left lower QUADRANT Extremities: no edema Neuro: intact GU: deferred  Resolved problem list   Assessment and Plan   Shock, hypovolemic vs septic Acute on chronic  diarrhoea IBS Chronic pain syndrome Mood disorder AKI Hx of recurrent UTI, on suppressive therapy  -s/p 2 L fluid resuscitation - agree with levophed  to keep MAP >65 - blood, urine cultures ordered. Start empiric zosyn  - cortisol, TSH ordered and pending - check trop and D dimer although pt doesn't provide any chest pain hx. ECHO in am - lactate within normal limits but does have new AKI. Place Foley. Monitor UO and stool output closely - H/H stable, given hx of black stool, FOBT ordered    Addendum: 6:44 - pt is now off levophed  Will keep on LR at 75/hr  Labs   CBC: Recent Labs  Lab 06/30/24 1046 07/05/24 0126 07/05/24 2316 07/06/24 0258  WBC 10.0 16.7* 8.5 8.2  NEUTROABS 6.6 8.9* 5.1  --   HGB 14.5 16.6* 12.7 12.4  HCT 41.8 49.8* 38.3 37.4  MCV 92.9 95.0 96.5 96.4  PLT 250 377 207 213    Basic Metabolic Panel: Recent Labs  Lab 06/30/24 1046 07/05/24 0126 07/05/24 2316 07/06/24 0258  NA 137 137 135 134*  K 4.1 4.6 3.9 3.7  CL 102 100 105 106  CO2 22 19* 21* 19*  GLUCOSE 103* 92 96 115*  BUN 7 12 12 11   CREATININE 0.95 1.17* 0.97 1.04*  CALCIUM 9.7 9.6 8.1* 8.1*   GFR: Estimated Creatinine Clearance: 82.3 mL/min (A) (by C-G formula based on SCr of 1.04 mg/dL (H)). Recent Labs  Lab 06/30/24 1046 07/05/24 0126 07/05/24 2316 07/06/24 0257 07/06/24 0258  WBC 10.0 16.7* 8.5  --  8.2  LATICACIDVEN 1.1  --   --  1.5  --  Liver Function Tests: Recent Labs  Lab 06/30/24 1046 07/05/24 0126 07/06/24 0258  AST 45* 49* 38  ALT 61* 57* 44  ALKPHOS 56 65 43  BILITOT 0.4 0.5 0.2  PROT 7.4 9.0* 5.9*  ALBUMIN 4.2 4.9 3.5   Recent Labs  Lab 06/30/24 1046 07/05/24 0126  LIPASE 25 30   No results for input(s): AMMONIA in the last 168 hours.  ABG    Component Value Date/Time   TCO2 23 08/14/2009 0204     Coagulation Profile: No results for input(s): INR, PROTIME in the last 168 hours.  Cardiac Enzymes: Recent Labs  Lab 07/06/24 0256   CKTOTAL 43    HbA1C: Hgb A1c MFr Bld  Date/Time Value Ref Range Status  08/14/2009 12:57 AM  4.6 - 6.1 % Final   5.7 (NOTE) The ADA recommends the following therapeutic goal for glycemic control related to Hgb A1c measurement: Goal of therapy: <6.5 Hgb A1c  Reference: American Diabetes Association: Clinical Practice Recommendations 2010, Diabetes Care, 2010, 33: (Suppl  1).    CBG: No results for input(s): GLUCAP in the last 168 hours.  Review of Systems:   Review of symptoms negative except mentioned above   Past Medical History:  She,  has a past medical history of Anxiety, Back pain, IBS (irritable bowel syndrome), and Social anxiety disorder.   Surgical History:   Past Surgical History:  Procedure Laterality Date   APPENDECTOMY     CESAREAN SECTION     OVARIAN CYST REMOVAL       Social History:   reports that she has quit smoking. She has never used smokeless tobacco. She reports current alcohol use. She reports that she does not use drugs.   Family History:  Her family history includes Anxiety disorder in her sister; Depression in her sister.   Allergies Allergies[1]   Home Medications  Prior to Admission medications  Medication Sig Start Date End Date Taking? Authorizing Provider  ALPRAZolam  (XANAX ) 1 MG tablet Take 1 mg by mouth 3 (three) times daily.   Yes [provider]  Dextromethorphan -buPROPion  ER (AUVELITY ) 45-105 MG TBCR Take 1 tablet by mouth in the morning and at bedtime.   Yes [provider]  FLUoxetine  (PROZAC ) 40 MG capsule Take 40 mg by mouth daily.   Yes [provider]  lithium  carbonate 150 MG capsule Take 450 mg by mouth at bedtime.   Yes [provider]  propranolol  ER (INDERAL  LA) 60 MG 24 hr capsule Take 60 mg by mouth in the morning and at bedtime.   Yes [provider]     Critical care time: 50        [1]  Allergies Allergen Reactions   Ultram [Tramadol] Rash   "

## 2024-07-06 NOTE — Progress Notes (Signed)
 Tylenol  did not help with patient's LLQ/Flank/back pain. She says her pain is 8/10. Dilaudid  not available to be administered until 2335.   RN notified Isaiah Lever, NP of the above information.  Orders Received: 15 mg IV Toradol .

## 2024-07-06 NOTE — Progress Notes (Addendum)
" °   07/06/24 0156 07/06/24 0158 07/06/24 0211  Vitals  BP (!) 90/48 (!) 87/54 (!) 72/52  MAP (mmHg) (!) 62 (!) 63  --   BP Location Right Arm Left Arm Left Arm  BP Method Automatic Automatic Manual   Patient's BP as above. Patient states she is not so much light headed as she is feeling sleepy, Feels like she needs to shut her eyes.   RN notified Isaiah Lever, NP of the above information.   Orders Received: 2L NS bolus, Labs  0218- Patient began shutting her eyes then opening them quickly. RN asked the patient how she was feeling.  Patient stated her eyes are really hard to keep open and they feel very heavy. RN notified the Isaiah Lever, NP immediately. RN started NS bolus.   0221 - Patient states she is feeling worse and is having vision changes. RN called Rapid Response and notified Isaiah Lever, NP  Provider and Rapid Response RN at bedside to assess the patient.   Orders Received: Labs, Abdominal Xray, Transfer patient to Step Down Unit. "

## 2024-07-06 NOTE — Plan of Care (Signed)
" °  Problem: Clinical Measurements: Goal: Ability to maintain clinical measurements within normal limits will improve Outcome: Progressing   Problem: Activity: Goal: Risk for activity intolerance will decrease Outcome: Progressing   Problem: Nutrition: Goal: Adequate nutrition will be maintained Outcome: Progressing   Problem: Coping: Goal: Level of anxiety will decrease Outcome: Progressing   Problem: Elimination: Goal: Will not experience complications related to bowel motility Outcome: Progressing Goal: Will not experience complications related to urinary retention Outcome: Progressing   Problem: Pain Managment: Goal: General experience of comfort will improve and/or be controlled Outcome: Progressing   Problem: Safety: Goal: Ability to remain free from injury will improve Outcome: Progressing   "

## 2024-07-06 NOTE — Progress Notes (Signed)
 CRITICAL CARE Performed by: Isaiah Lever Patient admitted yesterday morning with 4 to 5 weeks of persistent diarrhea associated with nausea vomiting and left lower quadrant abdominal pain.  Initial infectious workup was unremarkable.  CT abdomen and pelvis was unremarkable.  Presented with a white count of 16,500 that resolved after hydration with about 2 L of fluid.  Throughout the evening she was experiencing left lower quadrant abdominal pain with last dose of Dilaudid  0.5 mg at 11:47 PM.  Prior to that she was given a dose of Toradol  at 11 PM.  At 2:11 AM her BP was 72/52 and the patient was reporting feeling drowsy and like she needed to shut her eyes.  2 L normal saline bolus was ordered.  He is continuing to have left lower quadrant abdominal pain.  BP briefly improved into the 90s after 2 L but once again decreased back into the 70s.  At this juncture the patient had been transferred to the ICU.  Levophed  infusion max dose 10 mcg has been ordered and patient's blood pressure has improved to 100 range.  Foley catheter will be inserted.  Sepsis workup has been initiated to include blood cultures, lactic acid, CK, and urine culture noting urinalysis had been obtained yesterday morning.  Patient does have a history of recurrent UTIs on suppressive therapy with unknown medication.  Because of her recurrent hypotension cortisol level has been obtained.  It was noted that in September her TSH was elevated so this will also be repeated.  Critical care medicine will also be consulted.  Because the patient had been reporting some black-colored liquid stools fecal occult blood has also been ordered.  Patient does take lithium  so a lithium  level has also been ordered.  Constitutional: NAD, calm, uncomfortable due to ongoing LLQ pain Respiratory: clear to auscultation bilaterally, no wheezing, no crackles. Normal respiratory effort. No accessory muscle use.  Room air Cardiovascular: Regular rate and rhythm, no  murmurs / rubs / gallops. No extremity edema. 2+ pedal pulses.  SBP has ranged between 70s and 90s.  Levophed  has been initiated since arrival to ICU Abdomen: Focal LLQ tenderness some guarding but no rebounding, no masses palpated. No hepatosplenomegaly. Bowel sounds positive.  Skin: no rashes, lesions, ulcers. No induration Neurologic: CN 2-12 grossly intact. Sensation intact, Strength 4/5 x all 4 extremities.  Psychiatric: Normal judgment and insight. Awake and oriented x 3.    Total critical care time: 80 minutes beginning at 2:25 AM and ending at 3:49 AM  Critical care time was exclusive of separately billable procedures and treating other patients.  Critical care was necessary to treat or prevent imminent or life-threatening deterioration.  Critical care was time spent personally by me on the following activities: development of treatment plan with patient and/or surrogate as well as nursing, discussions with consultants, evaluation of patient's response to treatment, examination of patient, obtaining history from patient or surrogate, ordering and performing treatments and interventions, ordering and review of laboratory studies, ordering and review of radiographic studies, pulse oximetry and re-evaluation of patient's condition.

## 2024-07-07 DIAGNOSIS — R197 Diarrhea, unspecified: Secondary | ICD-10-CM | POA: Diagnosis not present

## 2024-07-07 LAB — ACTH STIMULATION, 3 TIME POINTS
Cortisol, 30 Min: 20.4 ug/dL
Cortisol, 60 Min: 25.2 ug/dL
Cortisol, Base: 6.7 ug/dL

## 2024-07-07 LAB — BASIC METABOLIC PANEL WITH GFR
Anion gap: 11 (ref 5–15)
BUN: <5 mg/dL — ABNORMAL LOW (ref 6–20)
CO2: 20 mmol/L — ABNORMAL LOW (ref 22–32)
Calcium: 8.9 mg/dL (ref 8.9–10.3)
Chloride: 104 mmol/L (ref 98–111)
Creatinine, Ser: 1.01 mg/dL — ABNORMAL HIGH (ref 0.44–1.00)
GFR, Estimated: >60 mL/min
Glucose, Bld: 124 mg/dL — ABNORMAL HIGH (ref 70–99)
Potassium: 3.7 mmol/L (ref 3.5–5.1)
Sodium: 135 mmol/L (ref 135–145)

## 2024-07-07 LAB — T3: T3, Total: 116 ng/dL (ref 71–180)

## 2024-07-07 MED ORDER — HYDROCODONE-ACETAMINOPHEN 5-325 MG PO TABS
1.0000 | ORAL_TABLET | ORAL | Status: DC | PRN
  Administered 2024-07-07 – 2024-07-09 (×9): 1 via ORAL
  Filled 2024-07-07 (×10): qty 1

## 2024-07-07 MED ORDER — MORPHINE SULFATE (PF) 2 MG/ML IV SOLN: 1.0000 mg | INTRAVENOUS | Status: DC | PRN

## 2024-07-07 MED ORDER — COSYNTROPIN 0.25 MG IJ SOLR: 0.2500 mg | Freq: Once | INTRAMUSCULAR | Status: DC

## 2024-07-07 MED ORDER — LACTATED RINGERS IV SOLN: INTRAVENOUS | Status: DC

## 2024-07-07 MED ORDER — MORPHINE SULFATE (PF) 2 MG/ML IV SOLN
1.0000 mg | INTRAVENOUS | Status: DC | PRN
  Administered 2024-07-07 (×3): 1 mg via INTRAVENOUS
  Filled 2024-07-07 (×3): qty 1

## 2024-07-07 MED ORDER — MORPHINE SULFATE (PF) 2 MG/ML IV SOLN
1.0000 mg | INTRAVENOUS | Status: DC | PRN
  Administered 2024-07-07: 1 mg via INTRAVENOUS
  Filled 2024-07-07: qty 1

## 2024-07-07 MED ORDER — COSYNTROPIN 0.25 MG IJ SOLR
0.2500 mg | Freq: Once | INTRAMUSCULAR | Status: AC
  Administered 2024-07-07: 0.25 mg via INTRAVENOUS
  Filled 2024-07-07: qty 0.25

## 2024-07-07 NOTE — Progress Notes (Incomplete Revision)
 " [facility] Progress Note  Patient: Amanda Rogers     FMW:[id number]  DOA: 07/05/2024   PCP: [name] SAILOR, FNP       Brief hospital course: 44 y/o F with ureteral stricture requiring repeated dilatations,  recurrent UTIs on suppressive Abx, urinary retention, lumbar spondylosis, radiculopathy, chronic pain, IBS, panic and mood disorder presented to ED for 4 wks of diarrhea LLQ pain. 5 days prior, she presented to the ED and had a neg work up and was discharged. Her diarrhea has gotten worse and she's been having at least 10 BMs a day, non bloody. She has incontinence occasionally. In her 34s she had constipation and required Linzess.   In ED : BP as low as 87/60 with MAP of 70  Bicarb 19, Cr 1.17 (baseline ~0.95) WBC 16.7 AST 49 ALT 57  UA: small leukocytes, U sp gravity > 1.046, 6-10 RBC and rare bacteria  C diff neg  Zosyn  started, PCCM consulted and Levophed  started  Subjective:  No BMs. Tolerating solid food.  She has small nodules on left upper arm that are chronic but she has noticed a new one. She has been asked to get an ultrasound of these in the past.  Assessment and Plan: Principal Problem:   Diarrhea with LLQ pain - has had this issue before and last saw GI in 2023- has had a colonoscopy and biopsies- has been treated for overflow incontinence - no h/o celiac disease - WBC count improved from 16.7 to 8.5 prior to receiving Zosyn  - continues to have some LLQ pain radiating to the back- does not worsen with leg elevation - stool for c diff and GI pathogen panel neg - ova-parasites ordered but she has not had a BM - tolerating regular diet - stop Zosyn  and follow - requesting change Dilaudid  to Morphine - Adding Norco as well    Active Problems:   Hypotension due to hypovolemia with shock -  Levophed  resumed yesterday - Propanolol on hold - cortisol low at 1.1 when checked yesterday at 2 AM - Cosyntropin  test done and baseline cortisol 6.7,  increasing to 20. 4in 30 min and 25.2 in 60 min    Dehydration - cont LR today  H/o urinary retention - intermittently self caths at home - has frequent UTIs - currently has a foley- leave in for now    Mood disorder - on lithium  - level was low at 0.47  LUE nodules - outpatient ultrasound  Abnormal TFTs - TSH 9.8 - Free T 4 1.09 - should be repeated as outpt      Code Status: Full Code Total time on patient care: 35 min DVT prophylaxis:  enoxaparin  (LOVENOX ) injection 40 mg Start: 07/05/24 1000  Objective:   Vitals:   07/07/24 0400 07/07/24 0500 07/07/24 0600 07/07/24 0715  BP: (!) 121/59 121/67 124/72   Pulse: 71 72 70   Resp: 15 12 13    Temp: 98 F (36.7 C)   98.2 F (36.8 C)  TempSrc: Oral   Oral  SpO2: 95% 93% 95%   Weight:      Height:       Filed Weights   07/06/24 0400  Weight: 99.5 kg   Exam: General exam: Appears comfortable  HEENT: oral mucosa moist Respiratory system: Clear to auscultation.  Cardiovascular system: S1 & S2 heard  Gastrointestinal system: Abdomen soft, left LQ, mid abdomen tender nondistended. Normal bowel sounds   Extremities: No cyanosis, clubbing or edema Psychiatry:  Mood &  affect appropriate.      CBC: Recent Labs  Lab 06/30/24 1046 07/05/24 0126 07/05/24 2316 07/06/24 0258  WBC 10.0 16.7* 8.5 8.2  NEUTROABS 6.6 8.9* 5.1  --   HGB 14.5 16.6* 12.7 12.4  HCT 41.8 49.8* 38.3 37.4  MCV 92.9 95.0 96.5 96.4  PLT 250 377 207 213   Basic Metabolic Panel: Recent Labs  Lab 06/30/24 1046 07/05/24 0126 07/05/24 2316 07/06/24 0258  NA 137 137 135 134*  K 4.1 4.6 3.9 3.7  CL 102 100 105 106  CO2 22 19* 21* 19*  GLUCOSE 103* 92 96 115*  BUN 7 12 12 11   CREATININE 0.95 1.17* 0.97 1.04*  CALCIUM 9.7 9.6 8.1* 8.1*     Scheduled Meds:  ALPRAZolam   1 mg Oral TID   Chlorhexidine  Gluconate Cloth  6 each Topical Daily   [START ON 07/08/2024] cosyntropin   0.25 mg Intravenous Once   enoxaparin  (LOVENOX ) injection  40 mg  Subcutaneous Q24H   FLUoxetine   40 mg Oral Daily   lidocaine   1 patch Transdermal Q24H   lithium  carbonate  450 mg Oral QHS    Imaging and lab data personally reviewed   Author: Reyes Fifield  07/07/2024 8:18 AM  To contact [facility]>   Check the care team in [facility] and look for the attending/consulting [facility] provider listed  Log into [email or web address] and use [place]'s universal password   Go to> [facility]  and find provider  If you still have difficulty reaching the provider, please page the [facility] (Director on Call) for the Hospitalists listed on amion     "

## 2024-07-07 NOTE — Progress Notes (Signed)
 " [facility] Progress Note  Patient: Amanda Rogers     FMW:[id number]  DOA: 07/05/2024   PCP: [name] SAILOR, FNP       Brief hospital course: 44 y/o F with ureteral stricture requiring repeated dilatations,  recurrent UTIs on suppressive Abx, urinary retention, lumbar spondylosis, radiculopathy, chronic pain, IBS, panic and mood disorder presented to ED for 4 wks of diarrhea LLQ pain. 5 days prior, she presented to the ED and had a neg work up and was discharged. Her diarrhea has gotten worse and she's been having at least 10 BMs a day, non bloody. She has incontinence occasionally. In her 37s she had constipation and required Linzess.   In ED : BP as low as 87/60 with MAP of 70  Bicarb 19, Cr 1.17 (baseline ~0.95) WBC 16.7 AST 49 ALT 57  UA: small leukocytes, U sp gravity > 1.046, 6-10 RBC and rare bacteria  C diff neg  Zosyn  started, PCCM consulted and Levophed  started  Subjective:  No BMs. Tolerating solid food.  She has small nodules on left upper arm that are chronic but she has noticed a new one. She has been asked to get an ultrasound of these in the past.  Assessment and Plan: Principal Problem:   Diarrhea with LLQ pain - has had this issue before and last saw GI in 2023- has had a colonoscopy and biopsies- has been treated for overflow incontinence - no h/o celiac disease - WBC count improved from 16.7 to 8.5 prior to receiving Zosyn  - continues to have some LLQ pain radiating to the back- does not worsen with leg elevation - stool for c diff and GI pathogen panel neg - ova-parasites ordered but she has not had a BM - tolerating regular diet - stop Zosyn  and follow - requesting change Dilaudid  to Morphine - Adding Norco as well    Active Problems:   Hypotension due to hypovolemia with shock -  Levophed  resumed yesterday - Propanolol on hold - cortisol low at 1.1- check Cosyntropin  test    Dehydration - cont LR today  H/o urinary retention -  intermittently self caths at home - has frequent UTIs - currently has a foley- leave in for now    Mood disorder - on lithium  - level was low at 0.47  LUE nodules - outpatient ultrasound      Code Status: Full Code Total time on patient care: 35 min DVT prophylaxis:  enoxaparin  (LOVENOX ) injection 40 mg Start: 07/05/24 1000  Objective:   Vitals:   07/07/24 0400 07/07/24 0500 07/07/24 0600 07/07/24 0715  BP: (!) 121/59 121/67 124/72   Pulse: 71 72 70   Resp: 15 12 13    Temp: 98 F (36.7 C)   98.2 F (36.8 C)  TempSrc: Oral   Oral  SpO2: 95% 93% 95%   Weight:      Height:       Filed Weights   07/06/24 0400  Weight: 99.5 kg   Exam: General exam: Appears comfortable  HEENT: oral mucosa moist Respiratory system: Clear to auscultation.  Cardiovascular system: S1 & S2 heard  Gastrointestinal system: Abdomen soft, left LQ, mid abdomen tender nondistended. Normal bowel sounds   Extremities: No cyanosis, clubbing or edema Psychiatry:  Mood & affect appropriate.      CBC: Recent Labs  Lab 06/30/24 1046 07/05/24 0126 07/05/24 2316 07/06/24 0258  WBC 10.0 16.7* 8.5 8.2  NEUTROABS 6.6 8.9* 5.1  --   HGB 14.5 16.6* 12.7  12.4  HCT 41.8 49.8* 38.3 37.4  MCV 92.9 95.0 96.5 96.4  PLT 250 377 207 213   Basic Metabolic Panel: Recent Labs  Lab 06/30/24 1046 07/05/24 0126 07/05/24 2316 07/06/24 0258  NA 137 137 135 134*  K 4.1 4.6 3.9 3.7  CL 102 100 105 106  CO2 22 19* 21* 19*  GLUCOSE 103* 92 96 115*  BUN 7 12 12 11   CREATININE 0.95 1.17* 0.97 1.04*  CALCIUM 9.7 9.6 8.1* 8.1*     Scheduled Meds:  ALPRAZolam   1 mg Oral TID   Chlorhexidine  Gluconate Cloth  6 each Topical Daily   [START ON 07/08/2024] cosyntropin   0.25 mg Intravenous Once   enoxaparin  (LOVENOX ) injection  40 mg Subcutaneous Q24H   FLUoxetine   40 mg Oral Daily   lidocaine   1 patch Transdermal Q24H   lithium  carbonate  450 mg Oral QHS    Imaging and lab data personally reviewed    Author: Areyanna Figeroa  07/07/2024 8:18 AM  To contact [facility]>   Check the care team in [facility] and look for the attending/consulting [facility] provider listed  Log into [email or web address] and use [place]'s universal password   Go to> [facility]  and find provider  If you still have difficulty reaching the provider, please page the [facility] (Director on Call) for the Hospitalists listed on amion     "

## 2024-07-07 NOTE — Plan of Care (Incomplete)

## 2024-07-08 ENCOUNTER — Inpatient Hospital Stay (HOSPITAL_COMMUNITY): Payer: PRIVATE HEALTH INSURANCE

## 2024-07-08 LAB — BASIC METABOLIC PANEL WITH GFR
Anion gap: 13 (ref 5–15)
BUN: <5 mg/dL — ABNORMAL LOW (ref 6–20)
CO2: 21 mmol/L — ABNORMAL LOW (ref 22–32)
Calcium: 9.1 mg/dL (ref 8.9–10.3)
Chloride: 104 mmol/L (ref 98–111)
Creatinine, Ser: 0.94 mg/dL (ref 0.44–1.00)
GFR, Estimated: >60 mL/min
Glucose, Bld: 111 mg/dL — ABNORMAL HIGH (ref 70–99)
Potassium: 3.8 mmol/L (ref 3.5–5.1)
Sodium: 137 mmol/L (ref 135–145)

## 2024-07-08 LAB — CBC
HCT: 41.4 % (ref 36.0–46.0)
Hemoglobin: 14.4 g/dL (ref 12.0–15.0)
MCH: 32.6 pg (ref 26.0–34.0)
MCHC: 34.8 g/dL (ref 30.0–36.0)
MCV: 93.7 fL (ref 80.0–100.0)
Platelets: 265 K/uL (ref 150–400)
RBC: 4.42 MIL/uL (ref 3.87–5.11)
RDW: 13 % (ref 11.5–15.5)
WBC: 13.1 K/uL — ABNORMAL HIGH (ref 4.0–10.5)
nRBC: 0 % (ref 0.0–0.2)

## 2024-07-08 LAB — MAGNESIUM: Magnesium: 2.1 mg/dL (ref 1.7–2.4)

## 2024-07-08 LAB — URINE CULTURE: Culture: 80000 — AB

## 2024-07-08 MED ORDER — KETOROLAC TROMETHAMINE 15 MG/ML IJ SOLN
15.0000 mg | Freq: Once | INTRAMUSCULAR | Status: AC
  Administered 2024-07-08: 15 mg via INTRAVENOUS
  Filled 2024-07-08: qty 1

## 2024-07-08 MED ORDER — MORPHINE SULFATE (PF) 2 MG/ML IV SOLN
2.0000 mg | INTRAVENOUS | Status: DC | PRN
  Administered 2024-07-08 – 2024-07-09 (×5): 2 mg via INTRAVENOUS
  Filled 2024-07-08 (×5): qty 1

## 2024-07-08 MED ORDER — ACETAMINOPHEN 650 MG RE SUPP: 650.0000 mg | Freq: Four times a day (QID) | RECTAL | Status: DC | PRN

## 2024-07-08 MED ORDER — BUTALBITAL-APAP-CAFFEINE 50-325-40 MG PO TABS
1.0000 | ORAL_TABLET | Freq: Once | ORAL | Status: AC
  Administered 2024-07-08: 1 via ORAL
  Filled 2024-07-08: qty 1

## 2024-07-08 MED ORDER — PROCHLORPERAZINE EDISYLATE 10 MG/2ML IJ SOLN
10.0000 mg | Freq: Once | INTRAMUSCULAR | Status: AC
  Administered 2024-07-08: 10 mg via INTRAVENOUS
  Filled 2024-07-08: qty 2

## 2024-07-08 MED ORDER — DEXAMETHASONE SODIUM PHOSPHATE 4 MG/ML IJ SOLN
4.0000 mg | Freq: Once | INTRAMUSCULAR | Status: AC
  Administered 2024-07-08: 4 mg via INTRAVENOUS
  Filled 2024-07-08: qty 1

## 2024-07-08 MED ORDER — ENSURE PLUS HIGH PROTEIN PO LIQD
237.0000 mL | Freq: Two times a day (BID) | ORAL | Status: DC
  Administered 2024-07-08 – 2024-07-11 (×5): 237 mL via ORAL

## 2024-07-08 MED ORDER — KETOROLAC TROMETHAMINE 30 MG/ML IJ SOLN: 30.0000 mg | Freq: Four times a day (QID) | INTRAMUSCULAR | Status: DC | PRN

## 2024-07-08 MED ORDER — ACETAMINOPHEN 325 MG PO TABS
325.0000 mg | ORAL_TABLET | Freq: Four times a day (QID) | ORAL | Status: DC | PRN
  Administered 2024-07-08: 325 mg via ORAL
  Filled 2024-07-08: qty 1

## 2024-07-08 MED ORDER — AMOXICILLIN-POT CLAVULANATE 500-125 MG PO TABS
1.0000 | ORAL_TABLET | Freq: Three times a day (TID) | ORAL | Status: DC
  Administered 2024-07-08 – 2024-07-10 (×6): 1 via ORAL
  Filled 2024-07-08 (×8): qty 1

## 2024-07-08 MED ORDER — KETOROLAC TROMETHAMINE 30 MG/ML IJ SOLN
30.0000 mg | Freq: Once | INTRAMUSCULAR | Status: AC
  Administered 2024-07-08: 30 mg via INTRAVENOUS
  Filled 2024-07-08: qty 1

## 2024-07-08 MED ORDER — SODIUM CHLORIDE 0.9 % IV SOLN
1.0000 g | INTRAVENOUS | Status: DC
  Administered 2024-07-08: 1 g via INTRAVENOUS
  Filled 2024-07-08: qty 10

## 2024-07-08 MED ORDER — SODIUM CHLORIDE 0.9 % IV SOLN: INTRAVENOUS | Status: AC

## 2024-07-08 MED ORDER — KETOROLAC TROMETHAMINE 30 MG/ML IJ SOLN: 30.0000 mg | Freq: Four times a day (QID) | INTRAMUSCULAR | Status: AC | PRN

## 2024-07-08 NOTE — Plan of Care (Signed)

## 2024-07-08 NOTE — Progress Notes (Addendum)
 " [facility] Progress Note  Patient: Amanda Rogers     FMW:[id number]  DOA: 07/05/2024   PCP: [name] SAILOR, FNP       Brief hospital course: 44 y/o F with ureteral stricture requiring repeated dilatations,  recurrent UTIs on suppressive Abx, urinary retention, lumbar spondylosis, radiculopathy, chronic pain, IBS, panic and mood disorder presented to ED for 4 wks of diarrhea LLQ pain. 5 days prior, she presented to the ED and had a neg work up and was discharged. Her diarrhea has gotten worse and she's been having at least 10 BMs a day, non bloody. She has incontinence occasionally. In her 66s she had constipation and required Linzess.   In ED : BP as low as 87/60 with MAP of 70  Bicarb 19, Cr 1.17 (baseline ~0.95) WBC 16.7 AST 49 ALT 57  UA: small leukocytes, U sp gravity > 1.046, 6-10 RBC and rare bacteria  C diff neg  Zosyn  started, PCCM consulted and Levophed  started  Subjective:  Last night, she had severe left sided flank pain followed by a pounding headache and sudden onset of sweating. She felt like she was going to die. This morning she had persistent pain in the right side of her head. Overall felt back to her usual.   Assessment and Plan: Principal Problem:   Diarrhea with LLQ pain - has had this issue before and last saw GI in 2023- has had a colonoscopy and biopsies- has been treated for overflow incontinence - no h/o celiac disease - Seems that WBC count improved from 16.7 to 8.5 prior to receiving Zosyn  which was later given by ICU team overnight - continues to have some LLQ pain radiating to the back- does not worsen with leg elevation - stool for c diff and GI pathogen panel neg - ova-parasites ordered but she has not had a BM -1/19>  tolerating regular diet - stop Zosyn  and follow -  requesting change Dilaudid  to Morphine - Adding Norco as well - no diarrhea today but was having dry heaving after her IV was flushed- cont NS at 75 cc/hr - left  flank and abdominal pain continue, dry heaving today but also eating - of note, being on chronic antibiotics can cause SIBO- will start Augmentin  TID x 7-10 days    Active Problems:   Hypotension due to hypovolemia with shock -  Levophed  titrated off on 1/19 - Propanolol on hold for hypotension - cortisol low at 1.1 when checked at 2 AM - Cosyntropin  test done and baseline cortisol 6.7, increasing to 20. 4in 30 min and 25.2 in 60 min    Dehydration - cont LR today  Vasovagal episode - suspect episode last night was vasovagal- telemetry showed HR dropped to 50s and then she had very short lived bigeminy and trigeminy - she was given Decadron , Toradol  and CTX  Anxiety - retching and feeling lightheaded today after IV was flushed may be related to anxiety- she has panic attacks and is typically on Propranolol  which is on hold due to hypotension and bradycardia- HR in 60 currently and SBP about 112 - cont Lithium  - level was low at 0.47 - Cont Xanax  TID- she has been keeping it down  Headache - try Toradol - can use this PRN for a short interval   H/o urinary retention - intermittently self caths at home - has frequent UTIs and was on suppressive antibiotics over the past couple of years - currently has a foley- will remove today and allow  her to cath QID  Pyuria vs UTI - urine growing 90, 000 colonies of  ecoli - Zosyn  started in ED stopped on 1/19 - CTX started last night  - she was not sure if she had a UTI because she typically dose not have symptoms- hold CTX- starting Augmentin   LUE nodules - outpatient ultrasound  Abnormal TFTs - TSH 9.8 - Free T 4 1.09 - should be repeated as outpt  Chronic back pain   Obesity class 2 Body mass index is 36.5 kg/m.     Code Status: Full Code Total time on patient care: 35 min DVT prophylaxis:  enoxaparin  (LOVENOX ) injection 40 mg Start: 07/05/24 1000  Objective:   Vitals:   07/08/24 0700 07/08/24 0800 07/08/24 0900  07/08/24 1100  BP: (!) 98/51 111/63 101/69   Pulse: (!) 57 60 (!) 57   Resp: 12 12 11    Temp: 97.9 F (36.6 C)   97.8 F (36.6 C)  TempSrc: Oral   Oral  SpO2: 95% 93% 94%   Weight:      Height:       Filed Weights   07/06/24 0400  Weight: 99.5 kg   Exam: General exam: Appears comfortable  HEENT: oral mucosa moist Respiratory system: Clear to auscultation.  Cardiovascular system: S1 & S2 heard  Gastrointestinal system: Abdomen soft, LLQ tender, Normal bowel sounds   Extremities: No cyanosis, clubbing or edema- small nodules in LUE near shoulder Psychiatry:  Mood & affect appropriate.      CBC: Recent Labs  Lab 07/05/24 0126 07/05/24 2316 07/06/24 0258 07/08/24 0027  WBC 16.7* 8.5 8.2 13.1*  NEUTROABS 8.9* 5.1  --   --   HGB 16.6* 12.7 12.4 14.4  HCT 49.8* 38.3 37.4 41.4  MCV 95.0 96.5 96.4 93.7  PLT 377 207 213 265   Basic Metabolic Panel: Recent Labs  Lab 07/05/24 0126 07/05/24 2316 07/06/24 0258 07/07/24 0920 07/08/24 0027  NA 137 135 134* 135 137  K 4.6 3.9 3.7 3.7 3.8  CL 100 105 106 104 104  CO2 19* 21* 19* 20* 21*  GLUCOSE 92 96 115* 124* 111*  BUN 12 12 11  <5* <5*  CREATININE 1.17* 0.97 1.04* 1.01* 0.94  CALCIUM 9.6 8.1* 8.1* 8.9 9.1  MG  --   --   --   --  2.1     Scheduled Meds:  ALPRAZolam   1 mg Oral TID   Chlorhexidine  Gluconate Cloth  6 each Topical Daily   enoxaparin  (LOVENOX ) injection  40 mg Subcutaneous Q24H   feeding supplement  237 mL Oral BID BM   FLUoxetine   40 mg Oral Daily   lidocaine   1 patch Transdermal Q24H   lithium  carbonate  450 mg Oral QHS    Imaging and lab data personally reviewed   Author: [employer]  07/08/2024 3:31 PM  To contact [facility]>   Check the care team in [facility] and look for the attending/consulting [facility] provider listed  Log into [email or web address] and use [place]'s universal password   Go to> [facility]  and find provider  If you still have difficulty reaching the provider,  please page the [facility] (Director on Call) for the Hospitalists listed on amion     "

## 2024-07-08 NOTE — Progress Notes (Addendum)
 To bedside to evaluate patient.  She was complaining of 8-10 out of 10 left-sided abdominal pain.  Dilaudid  had been changed to morphine  earlier in the day at patient request.  I opted to give her additional 1 mg dosage and subsequently she became nauseous, experienced a vagal response with associated bradycardia and bigeminy PVCs but then recovered.  Continued to have nausea to the point she had small amount of yellow bilious emesis.  On exam she was having left lower quadrant abdominal pain that radiated to her back.  Noted urine culture was positive for 80,000 colonies of gram-negative rods with identification and sensitivities pending.  Patient has history of recurrent essentially pansensitive E. coli UTI.  Patient had previously been placed on Vanco and Zosyn  for sepsis physiology and the antibiotics were discontinued on 1/17.  Of note patient was on Macrobid for recurrent UTI suppressive therapy.  On exam as noted above she was having abdominal pain in the left lower quadrant radiating to the back.  Bowel sounds were hypoactive throughout abdomen otherwise soft.  Heart rate initially was in the 50s and had increased to the 60s and was maintaining sinus etiology.  Heart rate went up into the 90s with emesis but came back down.  No hypotension noted.  Foley catheter in place draining clear to amber-colored urine to bedside bag.  She also developed a severe headache during her vagal episode.  She was given IV Decadron  and plans were to give Fioricet  but this was held due to ongoing nausea and episodes of emesis.  Due to the bigeminy magnesium level was checked.  Because of urine culture positive for gram-negative rods I initiated empiric Rocephin .  Previous blood cultures have demonstrated no growth to date and patient remains afebrile.  Portable abdominal films have been ordered.  Patient just had CT abdomen and pelvis on 1/17 and this was unremarkable.  I also gave her one-time dose of IV Compazine  to help  with her nausea.  I did review her outpatient notes from her neurologist Dr. Tanda with [facility].  Patient developed recurrent urinary retention in association with an acute UTI this past summer and Foley catheter was eventually removed and August.  At this point she was started on I/O cath in context of recurrent incomplete bladder emptying.  Will update this note as diagnostics result.  200 am Abdominal x-ray was unremarkable.  Patient currently asleep

## 2024-07-08 NOTE — Progress Notes (Signed)
" °   07/08/24 1542  TOC Brief Assessment  Insurance and Status Reviewed  Patient has primary care physician Yes  Home environment has been reviewed Apartment  Prior level of function: Independent with ADL's  Prior/Current Home Services No current home services  Social Drivers of Health Review SDOH reviewed no interventions necessary  Readmission risk has been reviewed Yes  Transition of care needs transition of care needs identified, TOC will continue to follow    "

## 2024-07-08 NOTE — Progress Notes (Signed)
 The patient had another episode of a severe headache, light sensitivity, and nausea/vomiting after iv was flushed. [name] notified, with medication ordered for her severe headache. This time episode was not accompanied by ectopy/telemetry changes.

## 2024-07-09 ENCOUNTER — Inpatient Hospital Stay (HOSPITAL_COMMUNITY): Payer: PRIVATE HEALTH INSURANCE

## 2024-07-09 LAB — BASIC METABOLIC PANEL WITH GFR
Anion gap: 10 (ref 5–15)
BUN: 6 mg/dL (ref 6–20)
CO2: 23 mmol/L (ref 22–32)
Calcium: 8.7 mg/dL — ABNORMAL LOW (ref 8.9–10.3)
Chloride: 107 mmol/L (ref 98–111)
Creatinine, Ser: 0.85 mg/dL (ref 0.44–1.00)
GFR, Estimated: >60 mL/min
Glucose, Bld: 110 mg/dL — ABNORMAL HIGH (ref 70–99)
Potassium: 3.8 mmol/L (ref 3.5–5.1)
Sodium: 140 mmol/L (ref 135–145)

## 2024-07-09 LAB — CBC
HCT: 38.4 % (ref 36.0–46.0)
Hemoglobin: 12.5 g/dL (ref 12.0–15.0)
MCH: 31.8 pg (ref 26.0–34.0)
MCHC: 32.6 g/dL (ref 30.0–36.0)
MCV: 97.7 fL (ref 80.0–100.0)
Platelets: 215 K/uL (ref 150–400)
RBC: 3.93 MIL/uL (ref 3.87–5.11)
RDW: 13.2 % (ref 11.5–15.5)
WBC: 15.4 K/uL — ABNORMAL HIGH (ref 4.0–10.5)
nRBC: 0 % (ref 0.0–0.2)

## 2024-07-09 MED ORDER — LIDOCAINE 5 % EX PTCH
2.0000 | MEDICATED_PATCH | CUTANEOUS | Status: DC
  Administered 2024-07-09: 2 via TRANSDERMAL
  Filled 2024-07-09: qty 2

## 2024-07-09 MED ORDER — HYDROCODONE-ACETAMINOPHEN 5-325 MG PO TABS
1.0000 | ORAL_TABLET | ORAL | Status: DC | PRN
  Administered 2024-07-09 – 2024-07-11 (×3): 2 via ORAL
  Filled 2024-07-09 (×3): qty 2

## 2024-07-09 MED ORDER — PROMETHAZINE HCL 25 MG PO TABS
25.0000 mg | ORAL_TABLET | Freq: Four times a day (QID) | ORAL | Status: DC | PRN
  Administered 2024-07-09 – 2024-07-10 (×2): 25 mg via ORAL
  Filled 2024-07-09 (×3): qty 1

## 2024-07-09 MED ORDER — PROMETHAZINE HCL 25 MG RE SUPP: 25.0000 mg | Freq: Four times a day (QID) | RECTAL | Status: DC | PRN

## 2024-07-09 MED ORDER — BUTALBITAL-APAP-CAFFEINE 50-325-40 MG PO TABS
1.0000 | ORAL_TABLET | Freq: Once | ORAL | Status: AC | PRN
  Administered 2024-07-09: 1 via ORAL
  Filled 2024-07-09: qty 1

## 2024-07-09 MED ORDER — SENNOSIDES-DOCUSATE SODIUM 8.6-50 MG PO TABS
1.0000 | ORAL_TABLET | Freq: Two times a day (BID) | ORAL | Status: DC
  Administered 2024-07-09 – 2024-07-11 (×4): 1 via ORAL
  Filled 2024-07-09 (×4): qty 1

## 2024-07-09 MED ORDER — SODIUM CHLORIDE 0.9 % IV SOLN
12.5000 mg | Freq: Four times a day (QID) | INTRAVENOUS | Status: DC | PRN
  Filled 2024-07-09: qty 0.5

## 2024-07-09 MED ORDER — MORPHINE SULFATE (PF) 2 MG/ML IV SOLN
2.0000 mg | Freq: Four times a day (QID) | INTRAVENOUS | Status: DC | PRN
  Administered 2024-07-10: 2 mg via INTRAVENOUS
  Filled 2024-07-09: qty 1

## 2024-07-09 NOTE — Progress Notes (Signed)
 " PROGRESS NOTE    Amanda Rogers  FMW:[id number] DOB: [birth year] DOA: 07/05/2024 PCP: [name] SAILOR, FNP  Chief Complaint  Patient presents with   Diarrhea   Abdominal Pain    Brief Narrative:   44 y/o F with hx recurrent UTI's, ureteral strictures requiring repeated dilatations, urinary retention, lumbar spondylosis, radiculopathy, chronic pain, IBS, panic and mood disorder presented to ED for 4 wks of diarrhea LLQ pain. 5 days prior, she presented to the ED and had Ranald Alessio neg work up and was discharged. Her diarrhea has gotten worse and she's been having at least 10 BMs Tecora Eustache day, non bloody. She has incontinence occasionally. In her 72s she had constipation and required Linzess.   Assessment & Plan:   Principal Problem:   Diarrhea Active Problems:   Hypotension due to hypovolemia   Dehydration   Mood disorder  Diarrhea Left Sided Abdominal Pain  L Flank Pain 5 weeks diarrhea, abdominal pain Unclear cause - seems to have issue with chronic abdominal pain CT 1/17 without acute findings in abdomen/pelvis Will consult GI for assistance Discussed need to reduce IV pain meds, try to minimize opiates for abdominal pain without clear etiology Was started on augmentin  for SIBO by [name]  Hypotension due to Hypovolemia Normal ACTH  stim Off pressors  Headache New, head CT without acute findings Minimize opiates Tylenol  or fioricet  prn headaches  Hx Urethral Stricture  Hx Pelvic Floor Dysfunction  Recurrent UTI  Per chart review, hx pelvic floor dysfunction with incomplete emptying leading to frequent UTI's Saw urology 01/2024, they recommended pelvic PT, flomax (she declined).  Recommended chronic intermittent cath TID. Urine culture with 80,000 CFU e. Coli - currently on augmentin   Vasovagal Episode Suspected vagal episode overnight  Abnormal Thyroid Function Tests Repeat outpatient  Chronic Back pain Noted  Mood Disorder Lithium    LUE Nodules Outpatient US  -  unclear what this is referring to   Class II Obesity Body mass index is 36.5 kg/m.    DVT prophylaxis: lovenox  Code Status: full Family Communication: one Disposition:   Status is: Inpatient Remains inpatient appropriate because: need for continued inpatient care   Consultants:  GI PCCM  Procedures:  Echo IMPRESSIONS     1. Left ventricular ejection fraction, by estimation, is 60 to 65%. The  left ventricle has normal function. The left ventricle has no regional  wall motion abnormalities. Left ventricular diastolic parameters were  normal.   2. Right ventricular systolic function is normal. The right ventricular  size is normal.   3. The mitral valve is grossly normal. Trivial mitral valve  regurgitation. No evidence of mitral stenosis.   4. The aortic valve is tricuspid. Aortic valve regurgitation is not  visualized. No aortic stenosis is present.   Antimicrobials:  Anti-infectives (From admission, onward)    Start     Dose/Rate Route Frequency Ordered Stop   07/08/24 1600  amoxicillin -clavulanate (AUGMENTIN ) 500-125 MG per tablet 1 tablet        1 tablet Oral 3 times daily 07/08/24 1547     07/08/24 0115  cefTRIAXone  (ROCEPHIN ) 1 g in sodium chloride  0.9 % 100 mL IVPB  Status:  Discontinued        1 g 200 mL/hr over 30 Minutes Intravenous Every 24 hours 07/08/24 0022 07/08/24 1537   07/06/24 0600  piperacillin -tazobactam (ZOSYN ) IVPB 3.375 g  Status:  Discontinued        3.375 g 12.5 mL/hr over 240 Minutes Intravenous Every 8 hours 07/06/24 0521  07/07/24 0820       Subjective: C/o HA Continued abdominal pain and flank pain  Objective: Vitals:   07/08/24 2136 07/09/24 0126 07/09/24 0501 07/09/24 1301  BP: 110/61 104/63 (!) 100/58 121/67  Pulse: 63 66 (!) 58 65  Resp: 20 19 18 15   Temp: 98.2 F (36.8 C) 97.9 F (36.6 C) 97.7 F (36.5 C) 98.3 F (36.8 C)  TempSrc: Oral   Oral  SpO2: 98% 99% 98% 96%  Weight:      Height:        Intake/Output  Summary (Last 24 hours) at 07/09/2024 1627 Last data filed at 07/09/2024 1500 Gross per 24 hour  Intake 998.68 ml  Output 1625 ml  Net -626.32 ml   Filed Weights   07/06/24 0400  Weight: 99.5 kg    Examination:  General exam: Appears calm and comfortable  Respiratory system: unlabored Cardiovascular system: RRR Gastrointestinal system: epigastric and LUQ TTP Central nervous system: Alert and oriented. No focal neurological deficits.  Symmetric LE strength, intact sensation to light touch. Extremities: no LEE    Data Reviewed: I have personally reviewed following labs and imaging studies  CBC: Recent Labs  Lab 07/05/24 0126 07/05/24 2316 07/06/24 0258 07/08/24 0027 07/09/24 0541  WBC 16.7* 8.5 8.2 13.1* 15.4*  NEUTROABS 8.9* 5.1  --   --   --   HGB 16.6* 12.7 12.4 14.4 12.5  HCT 49.8* 38.3 37.4 41.4 38.4  MCV 95.0 96.5 96.4 93.7 97.7  PLT 377 207 213 265 215    Basic Metabolic Panel: Recent Labs  Lab 07/05/24 2316 07/06/24 0258 07/07/24 0920 07/08/24 0027 07/09/24 0541  NA 135 134* 135 137 140  K 3.9 3.7 3.7 3.8 3.8  CL 105 106 104 104 107  CO2 21* 19* 20* 21* 23  GLUCOSE 96 115* 124* 111* 110*  BUN 12 11 <5* <5* 6  CREATININE 0.97 1.04* 1.01* 0.94 0.85  CALCIUM 8.1* 8.1* 8.9 9.1 8.7*  MG  --   --   --  2.1  --     GFR: Estimated Creatinine Clearance: 100.7 mL/min (by C-G formula based on SCr of 0.85 mg/dL).  Liver Function Tests: Recent Labs  Lab 07/05/24 0126 07/06/24 0258  AST 49* 38  ALT 57* 44  ALKPHOS 65 43  BILITOT 0.5 0.2  PROT 9.0* 5.9*  ALBUMIN 4.9 3.5    CBG: No results for input(s): GLUCAP in the last 168 hours.   Recent Results (from the past 240 hours)  C Difficile Quick Screen w PCR reflex     Status: None   Collection Time: 07/05/24  2:53 AM   Specimen: STOOL  Result Value Ref Range Status   C Diff antigen NEGATIVE NEGATIVE Final   C Diff toxin NEGATIVE NEGATIVE Final   C Diff interpretation No C. difficile  detected.  Final    Comment: Performed at [facility], [address] [place], [place] [address]  Gastrointestinal Panel by PCR , Stool     Status: None   Collection Time: 07/05/24  2:53 AM   Specimen: STOOL  Result Value Ref Range Status   Campylobacter species NOT DETECTED NOT DETECTED Final   Plesimonas shigelloides NOT DETECTED NOT DETECTED Final   Salmonella species NOT DETECTED NOT DETECTED Final   Yersinia enterocolitica NOT DETECTED NOT DETECTED Final   Vibrio species NOT DETECTED NOT DETECTED Final   Vibrio cholerae NOT DETECTED NOT DETECTED Final   Enteroaggregative E coli (EAEC) NOT DETECTED  NOT DETECTED Final   Enteropathogenic E coli (EPEC) NOT DETECTED NOT DETECTED Final   Enterotoxigenic E coli (ETEC) NOT DETECTED NOT DETECTED Final   Shiga like toxin producing E coli (STEC) NOT DETECTED NOT DETECTED Final   Shigella/Enteroinvasive E coli (EIEC) NOT DETECTED NOT DETECTED Final   Cryptosporidium NOT DETECTED NOT DETECTED Final   Cyclospora cayetanensis NOT DETECTED NOT DETECTED Final   Entamoeba histolytica NOT DETECTED NOT DETECTED Final   Giardia lamblia NOT DETECTED NOT DETECTED Final   Adenovirus F40/41 NOT DETECTED NOT DETECTED Final   Astrovirus NOT DETECTED NOT DETECTED Final   Norovirus GI/GII NOT DETECTED NOT DETECTED Final   Rotavirus Izabell Schalk NOT DETECTED NOT DETECTED Final   Sapovirus (I, II, IV, and V) NOT DETECTED NOT DETECTED Final    Comment: Performed at [facility], [address], [place], [place] [address]  Urine Culture (for pregnant, neutropenic or urologic patients or patients with an indwelling urinary catheter)     Status: Abnormal   Collection Time: 07/06/24  2:45 AM   Specimen: In/Out Cath Urine  Result Value Ref Range Status   Specimen Description   Final    IN/OUT CATH URINE Performed at [facility], [address], [place], [place] [address]    Special Requests   Final    NONE Performed at  [facility], [address], [place], [place] [address]    Culture 80,000 COLONIES/mL ESCHERICHIA COLI (Marty Sadlowski)  Final   Report Status 07/08/2024 FINAL  Final   Organism ID, Bacteria ESCHERICHIA COLI (Jori Frerichs)  Final      Susceptibility   Escherichia coli - MIC*    AMPICILLIN 8 SENSITIVE Sensitive     CEFAZOLIN (URINE) Value in next row Sensitive      2 SENSITIVEThis is Karis Rilling modified FDA-approved test that has been validated and its performance characteristics determined by the reporting laboratory.  This laboratory is certified under the Clinical Laboratory Improvement Amendments CLIA as qualified to perform high complexity clinical laboratory testing.    CEFEPIME Value in next row Sensitive      2 SENSITIVEThis is Lometa Riggin modified FDA-approved test that has been validated and its performance characteristics determined by the reporting laboratory.  This laboratory is certified under the Clinical Laboratory Improvement Amendments CLIA as qualified to perform high complexity clinical laboratory testing.    ERTAPENEM Value in next row Sensitive      2 SENSITIVEThis is Macalister Arnaud modified FDA-approved test that has been validated and its performance characteristics determined by the reporting laboratory.  This laboratory is certified under the Clinical Laboratory Improvement Amendments CLIA as qualified to perform high complexity clinical laboratory testing.    CEFTRIAXONE  Value in next row Sensitive      2 SENSITIVEThis is Littie Chiem modified FDA-approved test that has been validated and its performance characteristics determined by the reporting laboratory.  This laboratory is certified under the Clinical Laboratory Improvement Amendments CLIA as qualified to perform high complexity clinical laboratory testing.    CIPROFLOXACIN Value in next row Intermediate      2 SENSITIVEThis is Sherman Lipuma modified FDA-approved test that has been validated and its performance characteristics determined by the reporting laboratory.  This  laboratory is certified under the Clinical Laboratory Improvement Amendments CLIA as qualified to perform high complexity clinical laboratory testing.    GENTAMICIN Value in next row Sensitive      2 SENSITIVEThis is Antha Niday modified FDA-approved test that has been validated and its performance characteristics determined by  the reporting laboratory.  This laboratory is certified under the Clinical Laboratory Improvement Amendments CLIA as qualified to perform high complexity clinical laboratory testing.    NITROFURANTOIN Value in next row Sensitive      2 SENSITIVEThis is Kerryn Tennant modified FDA-approved test that has been validated and its performance characteristics determined by the reporting laboratory.  This laboratory is certified under the Clinical Laboratory Improvement Amendments CLIA as qualified to perform high complexity clinical laboratory testing.    TRIMETH /SULFA  Value in next row Sensitive      2 SENSITIVEThis is Sapir Lavey modified FDA-approved test that has been validated and its performance characteristics determined by the reporting laboratory.  This laboratory is certified under the Clinical Laboratory Improvement Amendments CLIA as qualified to perform high complexity clinical laboratory testing.    AMPICILLIN/SULBACTAM Value in next row Sensitive      2 SENSITIVEThis is Ernan Runkles modified FDA-approved test that has been validated and its performance characteristics determined by the reporting laboratory.  This laboratory is certified under the Clinical Laboratory Improvement Amendments CLIA as qualified to perform high complexity clinical laboratory testing.    PIP/TAZO Value in next row Sensitive      <=4 SENSITIVEThis is Mindy Behnken modified FDA-approved test that has been validated and its performance characteristics determined by the reporting laboratory.  This laboratory is certified under the Clinical Laboratory Improvement Amendments CLIA as qualified to perform high complexity clinical laboratory testing.     MEROPENEM Value in next row Sensitive      <=4 SENSITIVEThis is Aleksis Jiggetts modified FDA-approved test that has been validated and its performance characteristics determined by the reporting laboratory.  This laboratory is certified under the Clinical Laboratory Improvement Amendments CLIA as qualified to perform high complexity clinical laboratory testing.    * 80,000 COLONIES/mL ESCHERICHIA COLI  Culture, blood (Routine X 2) w Reflex to ID Panel     Status: None (Preliminary result)   Collection Time: 07/06/24  2:57 AM   Specimen: BLOOD RIGHT HAND  Result Value Ref Range Status   Specimen Description   Final    BLOOD RIGHT HAND Performed at [facility], [address], [place], [place] [address]    Special Requests   Final    BOTTLES DRAWN AEROBIC ONLY Blood Culture results may not be optimal due to an inadequate volume of blood received in culture bottles Performed at [facility], [address], [place], [place] [address]    Culture   Final    NO GROWTH 3 DAYS Performed at [facility], [address] [place], [place] [address]    Report Status PENDING  Incomplete  Culture, blood (Routine X 2) w Reflex to ID Panel     Status: None (Preliminary result)   Collection Time: 07/06/24  2:57 AM   Specimen: BLOOD RIGHT HAND  Result Value Ref Range Status   Specimen Description   Final    BLOOD RIGHT HAND Performed at [facility], [address] [place], [place] [address]    Special Requests   Final    BOTTLES DRAWN AEROBIC ONLY Blood Culture results may not be optimal due to an inadequate volume of blood received in culture bottles Performed at [facility], [address], [place], [place] [address]    Culture   Final    NO GROWTH 3 DAYS Performed at [facility], [address] [place], [place] [address]    Report Status PENDING  Incomplete  Radiology Studies: DG Abd Portable 1V Result Date: 07/08/2024 EXAM: 1  VIEW XRAY OF THE ABDOMEN 07/08/2024 12:58:00 AM COMPARISON: 07/06/2024 CLINICAL HISTORY: Emesis FINDINGS: BOWEL: Nonobstructive bowel gas pattern. SOFT TISSUES: No abnormal calcifications. BONES: No acute fracture. IMPRESSION: 1. No acute findings. Electronically signed by: Pinkie Pebbles MD 07/08/2024 01:02 AM EST RP Workstation: [id number]        Scheduled Meds:  ALPRAZolam   1 mg Oral TID   amoxicillin -clavulanate  1 tablet Oral TID   Chlorhexidine  Gluconate Cloth  6 each Topical Daily   enoxaparin  (LOVENOX ) injection  40 mg Subcutaneous Q24H   feeding supplement  237 mL Oral BID BM   FLUoxetine   40 mg Oral Daily   lidocaine   2 patch Transdermal Q24H   lithium  carbonate  450 mg Oral QHS   Continuous Infusions:  promethazine  (PHENERGAN ) injection (IM or IVPB) 12.5 mg (07/09/24 1001)     LOS: 3 days    Time spent: over 30 min     Meliton Monte, MD [facility]   To contact the attending provider between 7A-7P or the covering provider during after hours 7P-7A, please log into the web site [email or web address] and access using universal [place] password for that web site. If you do not have the password, please call the hospital operator.  07/09/2024, 4:27 PM    "

## 2024-07-10 LAB — COMPREHENSIVE METABOLIC PANEL WITH GFR
ALT: 20 U/L (ref 0–44)
AST: 17 U/L (ref 15–41)
Albumin: 3.4 g/dL — ABNORMAL LOW (ref 3.5–5.0)
Alkaline Phosphatase: 31 U/L — ABNORMAL LOW (ref 38–126)
Anion gap: 9 (ref 5–15)
BUN: 7 mg/dL (ref 6–20)
CO2: 25 mmol/L (ref 22–32)
Calcium: 8.7 mg/dL — ABNORMAL LOW (ref 8.9–10.3)
Chloride: 104 mmol/L (ref 98–111)
Creatinine, Ser: 0.87 mg/dL (ref 0.44–1.00)
GFR, Estimated: >60 mL/min
Glucose, Bld: 83 mg/dL (ref 70–99)
Potassium: 3.7 mmol/L (ref 3.5–5.1)
Sodium: 138 mmol/L (ref 135–145)
Total Bilirubin: <0.2 mg/dL (ref 0.0–1.2)
Total Protein: 6.1 g/dL — ABNORMAL LOW (ref 6.5–8.1)

## 2024-07-10 LAB — CBC WITH DIFFERENTIAL/PLATELET
Abs Immature Granulocytes: 0.05 K/uL (ref 0.00–0.07)
Basophils Absolute: 0.1 K/uL (ref 0.0–0.1)
Basophils Relative: 1 %
Eosinophils Absolute: 0.2 K/uL (ref 0.0–0.5)
Eosinophils Relative: 2 %
HCT: 38 % (ref 36.0–46.0)
Hemoglobin: 12.9 g/dL (ref 12.0–15.0)
Immature Granulocytes: 1 %
Lymphocytes Relative: 36 %
Lymphs Abs: 3.7 K/uL (ref 0.7–4.0)
MCH: 32.6 pg (ref 26.0–34.0)
MCHC: 33.9 g/dL (ref 30.0–36.0)
MCV: 96 fL (ref 80.0–100.0)
Monocytes Absolute: 0.7 K/uL (ref 0.1–1.0)
Monocytes Relative: 7 %
Neutro Abs: 5.6 K/uL (ref 1.7–7.7)
Neutrophils Relative %: 53 %
Platelets: 213 K/uL (ref 150–400)
RBC: 3.96 MIL/uL (ref 3.87–5.11)
RDW: 13.5 % (ref 11.5–15.5)
WBC: 10.3 K/uL (ref 4.0–10.5)
nRBC: 0 % (ref 0.0–0.2)

## 2024-07-10 LAB — MAGNESIUM: Magnesium: 1.9 mg/dL (ref 1.7–2.4)

## 2024-07-10 LAB — PHOSPHORUS: Phosphorus: 4.7 mg/dL — ABNORMAL HIGH (ref 2.5–4.6)

## 2024-07-10 MED ORDER — KETOROLAC TROMETHAMINE 15 MG/ML IJ SOLN
15.0000 mg | Freq: Once | INTRAMUSCULAR | Status: AC
  Administered 2024-07-10: 15 mg via INTRAVENOUS
  Filled 2024-07-10: qty 1

## 2024-07-10 MED ORDER — PROCHLORPERAZINE EDISYLATE 10 MG/2ML IJ SOLN
10.0000 mg | Freq: Once | INTRAMUSCULAR | Status: AC
  Administered 2024-07-10: 10 mg via INTRAVENOUS
  Filled 2024-07-10: qty 2

## 2024-07-10 MED ORDER — BUTALBITAL-APAP-CAFFEINE 50-325-40 MG PO TABS
1.0000 | ORAL_TABLET | Freq: Two times a day (BID) | ORAL | Status: DC | PRN
  Administered 2024-07-10: 1 via ORAL
  Filled 2024-07-10: qty 1

## 2024-07-10 MED ORDER — AMOXICILLIN-POT CLAVULANATE 875-125 MG PO TABS
1.0000 | ORAL_TABLET | Freq: Two times a day (BID) | ORAL | Status: DC
  Administered 2024-07-10 – 2024-07-11 (×2): 1 via ORAL
  Filled 2024-07-10 (×2): qty 1

## 2024-07-10 MED ORDER — KETOROLAC TROMETHAMINE 15 MG/ML IJ SOLN
15.0000 mg | Freq: Four times a day (QID) | INTRAMUSCULAR | Status: DC | PRN
  Administered 2024-07-10: 15 mg via INTRAVENOUS
  Filled 2024-07-10: qty 1

## 2024-07-10 MED ORDER — POLYETHYLENE GLYCOL 3350 17 G PO PACK
17.0000 g | PACK | Freq: Two times a day (BID) | ORAL | Status: DC
  Administered 2024-07-10 – 2024-07-11 (×3): 17 g via ORAL
  Filled 2024-07-10 (×3): qty 1

## 2024-07-10 MED ORDER — LACTATED RINGERS IV BOLUS
250.0000 mL | Freq: Once | INTRAVENOUS | Status: AC
  Administered 2024-07-10: 250 mL via INTRAVENOUS

## 2024-07-10 MED ORDER — DIPHENHYDRAMINE HCL 25 MG PO CAPS
25.0000 mg | ORAL_CAPSULE | Freq: Once | ORAL | Status: AC
  Administered 2024-07-10: 25 mg via ORAL
  Filled 2024-07-10: qty 1

## 2024-07-10 NOTE — Consult Note (Signed)
 [facility] Consult  Referring Provider: No ref. provider found Primary Care Physician:  [name] SAILOR, FNP Primary Gastroenterologist: [facility] - Dr. Lindaann  Reason for Consultation: Diarrhea  SUBJECTIVE:   HPI: Amanda Rogers is a 44 y.o. female with past medical history significant for irritable bowel syndrome mixed, recurrent urinary tract infection secondary to urinary retention on chronic antibiotics, history urethral stricture status post multiple dilatations.  Presented to hospital on 07/05/2024 with chief complaint of diarrhea x 5 weeks and left-sided abdominal pain.  Required brief ICU stay for hypotension.  Started on Augmentin  by internal medicine on 07/06/2024 for small intestinal bacterial overgrowth.  CT imaging 07/05/2024 unremarkable for diarrhea.  EGD/colonoscopy completed 03/11/2022, no report available for review.  EGD[place] , per review of records showed small hiatal hernia, biopsies were negative for celiac disease, hemorrhoids and polyps.  Patient noted that she began to experience diarrhea over the past 5 weeks.  Symptoms worse after eating, she does not recall fasting so is unsure if this improves her diarrhea.  Diarrhea started dark in color and became yellow.  Liquid in consistency.  she has left lower quadrant abdominal discomfort.  No fevers or chills.  No sick contacts.  No recent changes in medications apart from antibiotics.  No recent travel.  No prior cholecystectomy.  She has not had a bowel movement since hospitalization.  GI pathogen panel negative.  C. difficile testing negative.  Past Medical History:  Diagnosis Date   Anxiety    Back pain    IBS (irritable bowel syndrome)    Social anxiety disorder    Past Surgical History:  Procedure Laterality Date   APPENDECTOMY     CESAREAN SECTION     OVARIAN CYST REMOVAL     Prior to Admission medications  Medication Sig Start Date End  Date Taking? Authorizing Provider  ALPRAZolam  (XANAX ) 1 MG tablet Take 1 mg by mouth 3 (three) times daily.   Yes [provider]  Dextromethorphan -buPROPion  ER (AUVELITY ) 45-105 MG TBCR Take 1 tablet by mouth in the morning and at bedtime.   Yes [provider]  FLUoxetine  (PROZAC ) 40 MG capsule Take 40 mg by mouth daily.   Yes [provider]  lithium  carbonate 150 MG capsule Take 450 mg by mouth at bedtime.   Yes [provider]  propranolol  ER (INDERAL  LA) 60 MG 24 hr capsule Take 60 mg by mouth in the morning and at bedtime.   Yes [provider]   Current Facility-Administered Medications  Medication Dose Route Frequency Provider Last Rate Last Admin   acetaminophen  (TYLENOL ) tablet 325-650 mg  325-650 mg Oral Q6H PRN Rizwan, Saima, MD   325 mg at 07/08/24 1333   Or   acetaminophen  (TYLENOL ) suppository 650 mg  650 mg Rectal Q6H PRN Rizwan, Saima, MD       ALPRAZolam  (XANAX ) tablet 1 mg  1 mg Oral TID Amponsah, Prosper M, MD   1 mg at 07/09/24 2118   amoxicillin -clavulanate (AUGMENTIN ) 500-125 MG per tablet 1 tablet  1 tablet Oral TID Rizwan, Saima, MD   1 tablet at 07/10/24 1035   Chlorhexidine  Gluconate Cloth 2 % PADS 6 each  6 each Topical Daily Amponsah, Prosper M, MD   6 each at 07/08/24 1536   enoxaparin  (LOVENOX ) injection 40 mg  40 mg Subcutaneous Q24H Amponsah, Prosper M, MD   40 mg at 07/10/24 1035   feeding supplement (ENSURE PLUS HIGH PROTEIN) liquid 237  mL  237 mL Oral BID BM Rizwan, Saima, MD   237 mL at 07/10/24 1036   FLUoxetine  (PROZAC ) capsule 40 mg  40 mg Oral Daily Amponsah, Prosper M, MD   40 mg at 07/10/24 1035   HYDROcodone -acetaminophen  (NORCO/VICODIN) 5-325 MG per tablet 1-2 tablet  1-2 tablet Oral Q4H PRN Perri DELENA Meliton Mickey., MD   2 tablet at 07/09/24 1956   ketorolac  (TORADOL ) 30 MG/ML injection 30 mg  30 mg Intravenous Q6H PRN Rizwan, Saima, MD       lidocaine  (LIDODERM ) 5 % 2 patch  2 patch Transdermal Q24H Perri DELENA Meliton Mickey., MD   2 patch at 07/09/24 2120   lithium  carbonate capsule 450 mg  450 mg Oral QHS Lou Claretta HERO, MD   450 mg at 07/09/24 2118   ondansetron  (ZOFRAN ) tablet 4 mg  4 mg Oral Q6H PRN Amponsah, Prosper M, MD   4 mg at 07/06/24 9176   Or   ondansetron  (ZOFRAN ) injection 4 mg  4 mg Intravenous Q6H PRN Amponsah, Prosper M, MD   4 mg at 07/06/24 2058   polyethylene glycol (MIRALAX  / GLYCOLAX ) packet 17 g  17 g Oral BID Perri DELENA Meliton Mickey., MD   17 g at 07/10/24 1035   promethazine  (PHENERGAN ) tablet 25 mg  25 mg Oral Q6H PRN Perri DELENA Meliton Mickey., MD   25 mg at 07/09/24 2031   Or   promethazine  (PHENERGAN ) 12.5 mg in sodium chloride  0.9 % 50 mL IVPB  12.5 mg Intravenous Q6H PRN Perri DELENA Meliton Mickey., MD       Or   promethazine  (PHENERGAN ) suppository 25 mg  25 mg Rectal Q6H PRN Perri DELENA Meliton Mickey., MD       senna-docusate (Senokot-S) tablet 1 tablet  1 tablet Oral BID Perri DELENA Meliton Mickey., MD   1 tablet at 07/10/24 1035   Allergies as of 07/05/2024 - Review Complete 07/05/2024  Allergen Reaction Noted   Ultram [tramadol] Rash 05/04/2012   Family History  Problem Relation Age of Onset   Anxiety disorder Sister    Depression Sister    Social History   Socioeconomic History   Marital status: Married    Spouse name: Not on file   Number of children: Not on file   Years of education: Not on file   Highest education level: Not on file  Occupational History   Not on file  Tobacco Use   Smoking status: Former   Smokeless tobacco: Never  Vaping Use   Vaping status: Every Day  Substance and Sexual Activity   Alcohol use: Yes    Comment: occ   Drug use: No   Sexual activity: Yes    Birth control/protection: Pill  Other Topics Concern   Not on file  Social History Narrative   Not on file   Social Drivers of Health   Tobacco Use: Medium Risk (07/05/2024)   Patient History    Smoking Tobacco Use: Former    Smokeless Tobacco Use: Never    Passive  Exposure: Not on file  Financial Resource Strain: Low Risk (01/09/2024)   Received from [facility]   Overall Financial Resource Strain (CARDIA)    How hard is it for you to pay for the very basics like food, housing, medical care, and heating?: Not hard at all  Food Insecurity: No Food Insecurity (07/05/2024)   Epic    Worried About Radiation Protection Practitioner of Food in the Last Year: Never true  Ran Out of Food in the Last Year: Never true  Transportation Needs: No Transportation Needs (07/05/2024)   Epic    Lack of Transportation (Medical): No    Lack of Transportation (Non-Medical): No  Physical Activity: Insufficiently Active (01/09/2024)   Received from [facility]   Exercise Vital Sign    On average, how many days per week do you engage in moderate to strenuous exercise (like a brisk walk)?: 2 days    On average, how many minutes do you engage in exercise at this level?: 30 min  Stress: No Stress Concern Present (01/09/2024)   Received from [facility] - Occupational Stress Questionnaire    Do you feel stress - tense, restless, nervous, or anxious, or unable to sleep at night because your mind is troubled all the time - these days?: Not at all  Social Connections: Socially Integrated (01/09/2024)   Received from [facility]   Social Network    How would you rate your social network (family, work, friends)?: Good participation with social networks  Intimate Partner Violence: Not At Risk (07/05/2024)   Epic    Fear of Current or Ex-Partner: No    Emotionally Abused: No    Physically Abused: No    Sexually Abused: No  Depression (PHQ2-9): Not on file  Alcohol Screen: Not on file  Housing: Low Risk (07/05/2024)   Epic    Unable to Pay for Housing in the Last Year: No    Number of Times Moved in the Last Year: 0    Homeless in the Last Year: No  Utilities: Not At Risk (07/05/2024)   Epic    Threatened with loss of utilities: No  Health  Literacy: Not on file   Review of Systems:  Review of Systems  Respiratory:  Negative for shortness of breath.   Cardiovascular:  Negative for chest pain.  Gastrointestinal:  Positive for abdominal pain. Negative for blood in stool, constipation, diarrhea, nausea and vomiting.    OBJECTIVE:   Temp:  [98.3 F (36.8 C)-98.8 F (37.1 C)] 98.8 F (37.1 C) (01/21 2043) Pulse Rate:  [57-65] 57 (01/22 0548) Resp:  [14-15] 14 (01/21 2043) BP: (109-122)/(65-77) 122/77 (01/22 0548) SpO2:  [95 %-96 %] 95 % (01/21 2043) Last BM Date : 07/05/24 Physical Exam Constitutional:      General: She is not in acute distress.    Appearance: She is not ill-appearing, toxic-appearing or diaphoretic.  Cardiovascular:     Rate and Rhythm: Normal rate and regular rhythm.  Pulmonary:     Effort: No respiratory distress.     Breath sounds: Normal breath sounds.  Abdominal:     General: Bowel sounds are normal. There is no distension.     Palpations: Abdomen is soft.     Tenderness: There is no abdominal tenderness. There is no guarding.  Neurological:     Mental Status: She is alert.     Labs: Recent Labs    07/08/24 0027 07/09/24 0541 07/10/24 0535  WBC 13.1* 15.4* 10.3  HGB 14.4 12.5 12.9  HCT 41.4 38.4 38.0  PLT 265 215 213   BMET Recent Labs    07/08/24 0027 07/09/24 0541 07/10/24 0759  NA 137 140 138  K 3.8 3.8 3.7  CL 104 107 104  CO2 21* 23 25  GLUCOSE 111* 110* 83  BUN <5* 6 7  CREATININE 0.94 0.85 0.87  CALCIUM 9.1 8.7* 8.7*   LFT Recent Labs  07/10/24 0759  PROT 6.1*  ALBUMIN 3.4*  AST 17  ALT 20  ALKPHOS 31*  BILITOT <0.2   PT/INR No results for input(s): LABPROT, INR in the last 72 hours.  Diagnostic imaging: CT HEAD WO CONTRAST ( ) Result Date: 07/09/2024 EXAM: CT HEAD WITHOUT CONTRAST 07/09/2024 04:19:00 PM TECHNIQUE: CT of the head was performed without the administration of intravenous contrast. Automated exposure control, iterative  reconstruction, and/or weight based adjustment of the mA/kV was utilized to reduce the radiation dose to as low as reasonably achievable. COMPARISON: None available. CLINICAL HISTORY: Headache, sudden, severe. FINDINGS: BRAIN AND VENTRICLES: No acute hemorrhage. No evidence of acute infarct. No hydrocephalus. No extra-axial collection. No mass effect or midline shift. ORBITS: No acute abnormality. SINUSES: No acute abnormality. SOFT TISSUES AND SKULL: No acute soft tissue abnormality. No skull fracture. IMPRESSION: 1. No acute intracranial abnormality. Electronically signed by: Donnice Mania MD 07/09/2024 04:40 PM EST RP Workstation: [id number]   IMPRESSION: Chronic diarrhea, resolved since presentation to the hospital Left-sided abdominal pain Recurrent urinary tract infection Hypovolemia  PLAN: - No current diarrheal symptoms, infectious etiology less likely based on negative GI pathogen panel and C. difficile testing - Query postinfectious irritable bowel syndrome given improvement in her diarrheal symptoms and left-sided abdominal pain in the setting of unremarkable CT imaging - Monitor bowel movements - Check stool osmolality, stool sodium, stool potassium, pancreatic fecal elastase, fecal calprotectin - She can follow-up with her primary gastroenterologist in the outpatient setting for diarrhea - No plan for inpatient endoscopic evaluation - [facility] will be available as needed   LOS: 4 days   Estefana Keas, [facility]

## 2024-07-10 NOTE — Progress Notes (Signed)
 " PROGRESS NOTE    Amanda Rogers  FMW:[id number] DOB: 10/28/1980 DOA: 07/05/2024 PCP: [name] SAILOR, FNP  Chief Complaint  Patient presents with   Diarrhea   Abdominal Pain    Brief Narrative:   44 y/o F with hx recurrent UTI's, ureteral strictures requiring repeated dilatations, urinary retention, lumbar spondylosis, radiculopathy, chronic pain, IBS, panic and mood disorder presented to ED for 4 wks of diarrhea LLQ pain. 5 days prior, she presented to the ED and had Amanda Rogers neg work up and was discharged. Her diarrhea has gotten worse and she's been having at least 10 BMs [place] day, non bloody. She has incontinence occasionally. In her 44s she had constipation and required Linzess.   Assessment & Plan:   Principal Problem:   Diarrhea Active Problems:   Hypotension due to hypovolemia   Dehydration   Mood disorder  Diarrhea Left Sided Abdominal Pain  L Flank Pain 5 weeks diarrhea, abdominal pain Unclear cause - seems to have issue with chronic abdominal pain CT 1/17 without acute findings in abdomen/pelvis GI c/s - appreciate assistance - ? Postinfectious IBS - follow stool osmolality, stool potassium, pancreatic fecal elastase, fecal calprotectin - follow with primary GI  Discussed need to reduce IV pain meds, try to minimize opiates for abdominal pain without clear etiology Was started on augmentin  for SIBO by [name], will complete 10 day course  Hypotension due to Hypovolemia Normal ACTH  stim Off pressors  Headache New, head CT without acute findings Minimize opiates Tylenol  or fioricet  prn headaches  Hx Urethral Stricture  Hx Pelvic Floor Dysfunction  Recurrent UTI  Per chart review, hx pelvic floor dysfunction with incomplete emptying leading to frequent UTI's Saw urology 01/2024, they recommended pelvic PT, flomax (she declined).  Recommended chronic intermittent cath TID. Urine culture with 80,000 CFU e. Coli - currently on augmentin   Vasovagal Episode Suspected  vagal episode overnight  Abnormal Thyroid Function Tests Repeat outpatient  Chronic Back pain Noted  Mood Disorder Lithium    LUE Nodules Outpatient US  - unclear what this is referring to, will review with patient   Class II Obesity Body mass index is 36.5 kg/m.    DVT prophylaxis: lovenox  Code Status: full Family Communication: one Disposition:   Status is: Inpatient Remains inpatient appropriate because: need for continued inpatient care   Consultants:  GI PCCM  Procedures:  Echo IMPRESSIONS     1. Left ventricular ejection fraction, by estimation, is 60 to 65%. The  left ventricle has normal function. The left ventricle has no regional  wall motion abnormalities. Left ventricular diastolic parameters were  normal.   2. Right ventricular systolic function is normal. The right ventricular  size is normal.   3. The mitral valve is grossly normal. Trivial mitral valve  regurgitation. No evidence of mitral stenosis.   4. The aortic valve is tricuspid. Aortic valve regurgitation is not  visualized. No aortic stenosis is present.   Antimicrobials:  Anti-infectives (From admission, onward)    Start     Dose/Rate Route Frequency Ordered Stop   07/10/24 2200  amoxicillin -clavulanate (AUGMENTIN ) 875-125 MG per tablet 1 tablet        1 tablet Oral Every 12 hours 07/10/24 1407     07/08/24 1600  amoxicillin -clavulanate (AUGMENTIN ) 500-125 MG per tablet 1 tablet  Status:  Discontinued        1 tablet Oral 3 times daily 07/08/24 1547 07/10/24 1407   07/08/24 0115  cefTRIAXone  (ROCEPHIN ) 1 g in sodium chloride  0.9 %  100 mL IVPB  Status:  Discontinued        1 g 200 mL/hr over 30 Minutes Intravenous Every 24 hours 07/08/24 0022 07/08/24 1537   07/06/24 0600  piperacillin -tazobactam (ZOSYN ) IVPB 3.375 g  Status:  Discontinued        3.375 g 12.5 mL/hr over 240 Minutes Intravenous Every 8 hours 07/06/24 0521 07/07/24 0820       Subjective: Continued abdominal pain  and HA  Objective: Vitals:   07/09/24 1301 07/09/24 2043 07/10/24 0548 07/10/24 1326  BP: 121/67 109/65 122/77 (!) 97/44  Pulse: 65 64 (!) 57 73  Resp: 15 14  18   Temp: 98.3 F (36.8 C) 98.8 F (37.1 C)  98.6 F (37 C)  TempSrc: Oral Oral  Oral  SpO2: 96% 95%  95%  Weight:      Height:        Intake/Output Summary (Last 24 hours) at 07/10/2024 1526 Last data filed at 07/10/2024 1138 Gross per 24 hour  Intake 240 ml  Output 2350 ml  Net -2110 ml   Filed Weights   07/06/24 0400  Weight: 99.5 kg    Examination:  General: No acute distress. Cardiovascular: RRR Lungs:unlabored Abdomen: L sided abdominal discomfort seems improved today, though she notes pain is the same Neurological: Alert and oriented 3. Moves all extremities 4 with equal strength. Cranial nerves II through XII grossly intact. Extremities: No clubbing or cyanosis. No edema.   Data Reviewed: I have personally reviewed following labs and imaging studies  CBC: Recent Labs  Lab 07/05/24 0126 07/05/24 2316 07/06/24 0258 07/08/24 0027 07/09/24 0541 07/10/24 0535  WBC 16.7* 8.5 8.2 13.1* 15.4* 10.3  NEUTROABS 8.9* 5.1  --   --   --  5.6  HGB 16.6* 12.7 12.4 14.4 12.5 12.9  HCT 49.8* 38.3 37.4 41.4 38.4 38.0  MCV 95.0 96.5 96.4 93.7 97.7 96.0  PLT 377 207 213 265 215 213    Basic Metabolic Panel: Recent Labs  Lab 07/06/24 0258 07/07/24 0920 07/08/24 0027 07/09/24 0541 07/10/24 0759  NA 134* 135 137 140 138  K 3.7 3.7 3.8 3.8 3.7  CL 106 104 104 107 104  CO2 19* 20* 21* 23 25  GLUCOSE 115* 124* 111* 110* 83  BUN 11 <5* <5* 6 7  CREATININE 1.04* 1.01* 0.94 0.85 0.87  CALCIUM 8.1* 8.9 9.1 8.7* 8.7*  MG  --   --  2.1  --  1.9  PHOS  --   --   --   --  4.7*    GFR: Estimated Creatinine Clearance: 98.4 mL/min (by C-G formula based on SCr of 0.87 mg/dL).  Liver Function Tests: Recent Labs  Lab 07/05/24 0126 07/06/24 0258 07/10/24 0759  AST 49* 38 17  ALT 57* 44 20  ALKPHOS 65 43  31*  BILITOT 0.5 0.2 <0.2  PROT 9.0* 5.9* 6.1*  ALBUMIN 4.9 3.5 3.4*    CBG: No results for input(s): GLUCAP in the last 168 hours.   Recent Results (from the past 240 hours)  C Difficile Quick Screen w PCR reflex     Status: None   Collection Time: 07/05/24  2:53 AM   Specimen: STOOL  Result Value Ref Range Status   C Diff antigen NEGATIVE NEGATIVE Final   C Diff toxin NEGATIVE NEGATIVE Final   C Diff interpretation No C. difficile detected.  Final    Comment: Performed at [facility], [address] [place], [place] [address]  Gastrointestinal Panel by PCR , Stool     Status: None   Collection Time: 07/05/24  2:53 AM   Specimen: STOOL  Result Value Ref Range Status   Campylobacter species NOT DETECTED NOT DETECTED Final   Plesimonas shigelloides NOT DETECTED NOT DETECTED Final   Salmonella species NOT DETECTED NOT DETECTED Final   Yersinia enterocolitica NOT DETECTED NOT DETECTED Final   Vibrio species NOT DETECTED NOT DETECTED Final   Vibrio cholerae NOT DETECTED NOT DETECTED Final   Enteroaggregative E coli (EAEC) NOT DETECTED NOT DETECTED Final   Enteropathogenic E coli (EPEC) NOT DETECTED NOT DETECTED Final   Enterotoxigenic E coli (ETEC) NOT DETECTED NOT DETECTED Final   Shiga like toxin producing E coli (STEC) NOT DETECTED NOT DETECTED Final   Shigella/Enteroinvasive E coli (EIEC) NOT DETECTED NOT DETECTED Final   Cryptosporidium NOT DETECTED NOT DETECTED Final   Cyclospora cayetanensis NOT DETECTED NOT DETECTED Final   Entamoeba histolytica NOT DETECTED NOT DETECTED Final   Giardia lamblia NOT DETECTED NOT DETECTED Final   Adenovirus F40/41 NOT DETECTED NOT DETECTED Final   Astrovirus NOT DETECTED NOT DETECTED Final   Norovirus GI/GII NOT DETECTED NOT DETECTED Final   Rotavirus Shelia Kingsberry NOT DETECTED NOT DETECTED Final   Sapovirus (I, II, IV, and V) NOT DETECTED NOT DETECTED Final    Comment: Performed at [facility], [address]  [place], [place] [address]  Urine Culture (for pregnant, neutropenic or urologic patients or patients with an indwelling urinary catheter)     Status: Abnormal   Collection Time: 07/06/24  2:45 AM   Specimen: In/Out Cath Urine  Result Value Ref Range Status   Specimen Description   Final    IN/OUT CATH URINE Performed at [facility], [address], [place], [place] [address]    Special Requests   Final    NONE Performed at [facility], [address], [place], [place] [address]    Culture 80,000 COLONIES/mL ESCHERICHIA COLI (Mikkel Charrette)  Final   Report Status 07/08/2024 FINAL  Final   Organism ID, Bacteria ESCHERICHIA COLI (Nilza Eaker)  Final      Susceptibility   Escherichia coli - MIC*    AMPICILLIN 8 SENSITIVE Sensitive     CEFAZOLIN (URINE) Value in next row Sensitive      2 SENSITIVEThis is Emeric Novinger modified FDA-approved test that has been validated and its performance characteristics determined by the reporting laboratory.  This laboratory is certified under the Clinical Laboratory Improvement Amendments CLIA as qualified to perform high complexity clinical laboratory testing.    CEFEPIME Value in next row Sensitive      2 SENSITIVEThis is Jaedan Schuman modified FDA-approved test that has been validated and its performance characteristics determined by the reporting laboratory.  This laboratory is certified under the Clinical Laboratory Improvement Amendments CLIA as qualified to perform high complexity clinical laboratory testing.    ERTAPENEM Value in next row Sensitive      2 SENSITIVEThis is Keldon Lassen modified FDA-approved test that has been validated and its performance characteristics determined by the reporting laboratory.  This laboratory is certified under the Clinical Laboratory Improvement Amendments CLIA as qualified to perform high complexity clinical laboratory testing.    CEFTRIAXONE  Value in next row Sensitive      2 SENSITIVEThis is Shloime Keilman modified FDA-approved test that has been  validated and its performance characteristics determined by the reporting laboratory.  This laboratory is certified under the Clinical Laboratory Improvement Amendments CLIA as qualified to  perform high complexity clinical laboratory testing.    CIPROFLOXACIN Value in next row Intermediate      2 SENSITIVEThis is Tyna Huertas modified FDA-approved test that has been validated and its performance characteristics determined by the reporting laboratory.  This laboratory is certified under the Clinical Laboratory Improvement Amendments CLIA as qualified to perform high complexity clinical laboratory testing.    GENTAMICIN Value in next row Sensitive      2 SENSITIVEThis is Roddy Bellamy modified FDA-approved test that has been validated and its performance characteristics determined by the reporting laboratory.  This laboratory is certified under the Clinical Laboratory Improvement Amendments CLIA as qualified to perform high complexity clinical laboratory testing.    NITROFURANTOIN Value in next row Sensitive      2 SENSITIVEThis is Tayshun Gappa modified FDA-approved test that has been validated and its performance characteristics determined by the reporting laboratory.  This laboratory is certified under the Clinical Laboratory Improvement Amendments CLIA as qualified to perform high complexity clinical laboratory testing.    TRIMETH /SULFA  Value in next row Sensitive      2 SENSITIVEThis is Khristy Kalan modified FDA-approved test that has been validated and its performance characteristics determined by the reporting laboratory.  This laboratory is certified under the Clinical Laboratory Improvement Amendments CLIA as qualified to perform high complexity clinical laboratory testing.    AMPICILLIN/SULBACTAM Value in next row Sensitive      2 SENSITIVEThis is Jatia Musa modified FDA-approved test that has been validated and its performance characteristics determined by the reporting laboratory.  This laboratory is certified under the Clinical Laboratory  Improvement Amendments CLIA as qualified to perform high complexity clinical laboratory testing.    PIP/TAZO Value in next row Sensitive      <=4 SENSITIVEThis is Flynt Breeze modified FDA-approved test that has been validated and its performance characteristics determined by the reporting laboratory.  This laboratory is certified under the Clinical Laboratory Improvement Amendments CLIA as qualified to perform high complexity clinical laboratory testing.    MEROPENEM Value in next row Sensitive      <=4 SENSITIVEThis is Favor Hackler modified FDA-approved test that has been validated and its performance characteristics determined by the reporting laboratory.  This laboratory is certified under the Clinical Laboratory Improvement Amendments CLIA as qualified to perform high complexity clinical laboratory testing.    * 80,000 COLONIES/mL ESCHERICHIA COLI  Culture, blood (Routine X 2) w Reflex to ID Panel     Status: None (Preliminary result)   Collection Time: 07/06/24  2:57 AM   Specimen: BLOOD RIGHT HAND  Result Value Ref Range Status   Specimen Description   Final    BLOOD RIGHT HAND Performed at [facility], [address], [place], [place] [address]    Special Requests   Final    BOTTLES DRAWN AEROBIC ONLY Blood Culture results may not be optimal due to an inadequate volume of blood received in culture bottles Performed at [facility], [address], [place], [place] [address]    Culture   Final    NO GROWTH 4 DAYS Performed at [facility], [address] [place], [place] [address]    Report Status PENDING  Incomplete  Culture, blood (Routine X 2) w Reflex to ID Panel     Status: None (Preliminary result)   Collection Time: 07/06/24  2:57 AM   Specimen: BLOOD RIGHT HAND  Result Value Ref Range Status   Specimen Description   Final    BLOOD RIGHT HAND Performed at  [facility], [address], [place], [place] [address]    Special Requests   Final    BOTTLES  DRAWN AEROBIC ONLY Blood Culture results may not be optimal due to an inadequate volume of blood received in culture bottles Performed at [facility], [address], [place], [place] [address]    Culture   Final    NO GROWTH 4 DAYS Performed at [facility], [address], [place], [place] [address]    Report Status PENDING  Incomplete         Radiology Studies: CT HEAD WO CONTRAST ( ) Result Date: 07/09/2024 EXAM: CT HEAD WITHOUT CONTRAST 07/09/2024 04:19:00 PM TECHNIQUE: CT of the head was performed without the administration of intravenous contrast. Automated exposure control, iterative reconstruction, and/or weight based adjustment of the mA/kV was utilized to reduce the radiation dose to as low as reasonably achievable. COMPARISON: None available. CLINICAL HISTORY: Headache, sudden, severe. FINDINGS: BRAIN AND VENTRICLES: No acute hemorrhage. No evidence of acute infarct. No hydrocephalus. No extra-axial collection. No mass effect or midline shift. ORBITS: No acute abnormality. SINUSES: No acute abnormality. SOFT TISSUES AND SKULL: No acute soft tissue abnormality. No skull fracture. IMPRESSION: 1. No acute intracranial abnormality. Electronically signed by: Donnice Mania MD 07/09/2024 04:40 PM EST RP Workstation: [id number]        Scheduled Meds:  ALPRAZolam   1 mg Oral TID   amoxicillin -clavulanate  1 tablet Oral Q12H   enoxaparin  (LOVENOX ) injection  40 mg Subcutaneous Q24H   feeding supplement  237 mL Oral BID BM   FLUoxetine   40 mg Oral Daily   lidocaine   2 patch Transdermal Q24H   lithium  carbonate  450 mg Oral QHS   polyethylene glycol  17 g Oral BID   senna-docusate  1 tablet Oral BID   Continuous Infusions:  promethazine  (PHENERGAN ) injection (IM or IVPB)       LOS: 4 days    Time spent: over 30 min     Meliton Monte, MD [facility]   To contact the attending provider between 7A-7P or the covering provider during  after hours 7P-7A, please log into the web site [email or web address] and access using universal Leon password for that web site. If you do not have the password, please call the hospital operator.  07/10/2024, 3:26 PM    "

## 2024-07-11 ENCOUNTER — Other Ambulatory Visit (HOSPITAL_COMMUNITY): Payer: Self-pay

## 2024-07-11 LAB — CBC WITH DIFFERENTIAL/PLATELET
Abs Immature Granulocytes: 0.06 K/uL (ref 0.00–0.07)
Basophils Absolute: 0.1 K/uL (ref 0.0–0.1)
Basophils Relative: 1 %
Eosinophils Absolute: 0.2 K/uL (ref 0.0–0.5)
Eosinophils Relative: 2 %
HCT: 41.5 % (ref 36.0–46.0)
Hemoglobin: 13.6 g/dL (ref 12.0–15.0)
Immature Granulocytes: 1 %
Lymphocytes Relative: 33 %
Lymphs Abs: 3.6 K/uL (ref 0.7–4.0)
MCH: 31.5 pg (ref 26.0–34.0)
MCHC: 32.8 g/dL (ref 30.0–36.0)
MCV: 96.1 fL (ref 80.0–100.0)
Monocytes Absolute: 0.8 K/uL (ref 0.1–1.0)
Monocytes Relative: 7 %
Neutro Abs: 6.1 K/uL (ref 1.7–7.7)
Neutrophils Relative %: 56 %
Platelets: 204 K/uL (ref 150–400)
RBC: 4.32 MIL/uL (ref 3.87–5.11)
RDW: 13.2 % (ref 11.5–15.5)
WBC: 10.7 K/uL — ABNORMAL HIGH (ref 4.0–10.5)
nRBC: 0 % (ref 0.0–0.2)

## 2024-07-11 LAB — COMPREHENSIVE METABOLIC PANEL WITH GFR
ALT: 29 U/L (ref 0–44)
AST: 29 U/L (ref 15–41)
Albumin: 3.6 g/dL (ref 3.5–5.0)
Alkaline Phosphatase: 33 U/L — ABNORMAL LOW (ref 38–126)
Anion gap: 10 (ref 5–15)
BUN: 8 mg/dL (ref 6–20)
CO2: 27 mmol/L (ref 22–32)
Calcium: 9.3 mg/dL (ref 8.9–10.3)
Chloride: 102 mmol/L (ref 98–111)
Creatinine, Ser: 0.91 mg/dL (ref 0.44–1.00)
GFR, Estimated: >60 mL/min
Glucose, Bld: 87 mg/dL (ref 70–99)
Potassium: 4.1 mmol/L (ref 3.5–5.1)
Sodium: 139 mmol/L (ref 135–145)
Total Bilirubin: 0.2 mg/dL (ref 0.0–1.2)
Total Protein: 6.4 g/dL — ABNORMAL LOW (ref 6.5–8.1)

## 2024-07-11 LAB — CULTURE, BLOOD (ROUTINE X 2)
Culture: NO GROWTH
Culture: NO GROWTH

## 2024-07-11 LAB — PHOSPHORUS: Phosphorus: 6.2 mg/dL — ABNORMAL HIGH (ref 2.5–4.6)

## 2024-07-11 LAB — MAGNESIUM: Magnesium: 2.1 mg/dL (ref 1.7–2.4)

## 2024-07-11 MED ORDER — AMOXICILLIN-POT CLAVULANATE 875-125 MG PO TABS
1.0000 | ORAL_TABLET | Freq: Two times a day (BID) | ORAL | 0 refills | Status: AC
  Filled 2024-07-11: qty 18, 9d supply, fill #0

## 2024-07-11 MED ORDER — POLYETHYLENE GLYCOL 3350 17 GM/SCOOP PO POWD
17.0000 g | Freq: Every day | ORAL | 0 refills | Status: AC
  Filled 2024-07-11: qty 238, 14d supply, fill #0

## 2024-07-11 NOTE — Progress Notes (Signed)
 Discharge medications delivered to patient at the bedside in a secure bag.

## 2024-07-11 NOTE — Plan of Care (Signed)

## 2024-07-11 NOTE — Discharge Summary (Signed)
 Physician Discharge Summary  Amanda Rogers FMW:[id number] DOB: 26-Mar-1981 DOA: 07/05/2024  PCP: Amanda Redge SAILOR, FNP  Admit date: 07/05/2024 Discharge date: 07/11/2024  Time spent: 40 minutes  Recommendations for Outpatient Follow-up:  Follow outpatient CBC/CMP  Follow with GI outpatient Has left upper extremity nodules that should be followed outpatient, consider US  Needs repeat thyroid function tests outpatient  Follow up with urology and gyn outpatient    Discharge Diagnoses:  Principal Problem:   Diarrhea Active Problems:   Hypotension due to hypovolemia   Dehydration   Mood disorder   Discharge Condition: stable  Diet recommendation: heart healthy  Filed Weights   07/06/24 0400  Weight: 99.5 kg    History of present illness:   44 y/o F with hx recurrent UTI's, ureteral strictures requiring repeated dilatations, urinary retention, lumbar spondylosis, radiculopathy, chronic pain, IBS, panic and mood disorder presented to ED for 4 wks of diarrhea LLQ pain.   5 days prior, she presented to the ED and had Amanda Rogers neg work up and was discharged. Her diarrhea worsened and she's been having at least 10 BMs Amanda Rogers day, non bloody. She has incontinence occasionally. In her 44s she had constipation and required Linzess.   She was admitted for diarrhea and abdominal pain.  CT without acute findings.  She has now not had Amanda Rogers BM for several days.  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  Stable for discharge 1/23 with outpatient follow up.  Hospital Course:  Assessment and Plan:  Diarrhea Left Sided Abdominal Pain  L Flank Pain 5 weeks diarrhea, abdominal pain Unclear cause - seems to have issue with chronic abdominal pain CT 1/17 without acute findings in abdomen/pelvis GI c/s - appreciate assistance - ? Postinfectious IBS - follow stool osmolality, stool potassium, pancreatic fecal elastase, fecal calprotectin - follow with primary GI  No BM in several days, abdominal pain  improving.  She feels comfortable discharging home.  Will send with miralax .  Plan outpatient GI follow up. Was started on augmentin  for SIBO by Amanda Rogers, will complete 10 day course   Hypotension due to Hypovolemia Normal ACTH  stim Off pressors   Headache New, head CT without acute findings Minimize opiates Tylenol  or fioricet  prn headaches   Hx Urethral Stricture  Hx Pelvic Floor Dysfunction  Recurrent UTI  Per chart review, hx pelvic floor dysfunction with incomplete emptying leading to frequent UTI's Saw urology 01/2024, they recommended pelvic PT, flomax (she declined).  Recommended chronic intermittent cath TID. Urine culture with 80,000 CFU e. Coli - currently on augmentin    Vasovagal Episode Suspected vagal episode overnight   Abnormal Thyroid Function Tests Repeat outpatient   Chronic Back pain Noted   Mood Disorder Lithium    LUE Nodules 2 nodules on LUE - follow outpatient for additional workup   Class II Obesity Body mass index is 36.5 kg/m.    Procedures: none   Consultations: GI  Discharge Exam: Vitals:   07/10/24 2109 07/11/24 0506  BP: 126/75 121/70  Pulse: 70 68  Resp: 19 18  Temp: 98.4 F (36.9 C) 98.1 F (36.7 C)  SpO2: 97% 98%   No complaints  General: No acute distress. Cardiovascular: RRR Lungs: unlabored Abdomen: Soft, nontender, nondistended  Neurological: Alert and oriented 3. Moves all extremities 4 with equal strength. Cranial nerves II through XII grossly intact. Extremities: No clubbing or cyanosis. No edema.  Discharge Instructions   Discharge Instructions     Call MD for:  difficulty breathing, headache or visual  disturbances   Complete by: As directed    Call MD for:  extreme fatigue   Complete by: As directed    Call MD for:  hives   Complete by: As directed    Call MD for:  persistant dizziness or light-headedness   Complete by: As directed    Call MD for:  persistant nausea and vomiting   Complete by:  As directed    Call MD for:  redness, tenderness, or signs of infection (pain, swelling, redness, odor or green/yellow discharge around incision site)   Complete by: As directed    Call MD for:  severe uncontrolled pain   Complete by: As directed    Call MD for:  temperature >100.4   Complete by: As directed    Discharge instructions   Complete by: As directed    You were admitted for 5 weeks of diarrhea and abdominal pain.  Your diarrhea has improved here, but now it seems you're constipated.  We've placed you on miralax .  Continue this daily until you have Amanda Rogers soft bowel movement daily, then adjust as needed.  It's unclear what caused your diarrhea and symptoms at presentation.  It could be post infectious irritable bowel syndrome.  You've been seen by GI here, they ordered stool studies, but since you're no longer having diarrhea these can be followed outpatient as needed.    We're treating you empirically for small intestine bacterial overgrowth with augmentin .  This also covers the urinary tract infection you had.   Follow with your primary gastroenterologist as an outpatient.  Your CT scan here was reassuring of your belly.  We also did Monta Rogers CT scan of your head which was normal in the setting of your headaches.  You can use tylenol  for your abdominal discomfort and headaches as needed.  I would avoid opiates at this time.  You should continued to follow up with urology and gynecology as an outpatient.  You had abnormal thyroid function tests that should be followed up as an outpatient.  Return for new, recurrent, or worsening symptoms.  Please ask your PCP to request records from this hospitalization so they know what was done and what the next steps will be.   Increase activity slowly   Complete by: As directed       Allergies as of 07/11/2024       Reactions   Ultram [tramadol] Rash        Medication List     TAKE these medications    ALPRAZolam  1 MG tablet Commonly  known as: XANAX  Take 1 mg by mouth 3 (three) times daily.   amoxicillin -clavulanate 875-125 MG tablet Commonly known as: AUGMENTIN  Take 1 tablet by mouth every 12 (twelve) hours for 9 days.   Auvelity  45-105 MG Tbcr Generic drug: Dextromethorphan -buPROPion  ER Take 1 tablet by mouth in the morning and at bedtime.   FLUoxetine  40 MG capsule Commonly known as: PROZAC  Take 40 mg by mouth daily.   lithium  carbonate 150 MG capsule Take 450 mg by mouth at bedtime.   polyethylene glycol powder 17 GM/SCOOP powder Commonly known as: MiraLax  Take 17 g by mouth daily. Dissolve 1 capful (17g) in 4-8 ounces of liquid and take by mouth daily.   propranolol  ER 60 MG 24 hr capsule Commonly known as: INDERAL  LA Take 60 mg by mouth in the morning and at bedtime.       Allergies[1]  Follow-up Information     Amanda Redge SAILOR, FNP Follow up.  Specialty: Nurse Practitioner Why: call for Hassie Mandt follow up appointment Contact information: [address] [facility] [address] [place] [address] [phone]         Kriss Estefana DEL, DO Follow up.   Specialty: Gastroenterology Contact information: [address] [place] [address] [place] [address] [phone]                  The results of significant diagnostics from this hospitalization (including imaging, microbiology, ancillary and laboratory) are listed below for reference.    Significant Diagnostic Studies: CT HEAD WO CONTRAST ( ) Result Date: 07/09/2024 EXAM: CT HEAD WITHOUT CONTRAST 07/09/2024 04:19:00 PM TECHNIQUE: CT of the head was performed without the administration of intravenous contrast. Automated exposure control, iterative reconstruction, and/or weight based adjustment of the mA/kV was utilized to reduce the radiation dose to as low as reasonably achievable. COMPARISON: None available. CLINICAL HISTORY: Headache, sudden, severe. FINDINGS: BRAIN AND VENTRICLES: No acute hemorrhage. No evidence of acute infarct. No  hydrocephalus. No extra-axial collection. No mass effect or midline shift. ORBITS: No acute abnormality. SINUSES: No acute abnormality. SOFT TISSUES AND SKULL: No acute soft tissue abnormality. No skull fracture. IMPRESSION: 1. No acute intracranial abnormality. Electronically signed by: Donnice Mania MD 07/09/2024 04:40 PM EST RP Workstation: [id number]   DG Abd Portable 1V Result Date: 07/08/2024 EXAM: 1 VIEW XRAY OF THE ABDOMEN 07/08/2024 12:58:00 AM COMPARISON: 07/06/2024 CLINICAL HISTORY: Emesis FINDINGS: BOWEL: Nonobstructive bowel gas pattern. SOFT TISSUES: No abnormal calcifications. BONES: No acute fracture. IMPRESSION: 1. No acute findings. Electronically signed by: Pinkie Pebbles MD 07/08/2024 01:02 AM EST RP Workstation: [id number]   ECHOCARDIOGRAM COMPLETE Result Date: 07/06/2024    ECHOCARDIOGRAM REPORT   Patient Name:   Elliannah Micah Barnier Hunkele Date of Exam: 07/06/2024 Medical Rec #:  [id number]        Height:       65.0 in Accession #:    [id number]       Weight:       219.4 lb Date of Birth:  05/11/1981         BSA:          2.057 m Patient Age:    43 years         BP:           92/63 mmHg Patient Gender: F                HR:           69 bpm. Exam Location:  Inpatient Procedure: 2D Echo, Color Doppler and Cardiac Doppler (Both Spectral and Color            Flow Doppler were utilized during procedure). Indications:    Shock R57.9  History:        Patient has no prior history of Echocardiogram examinations.                 Signs/Symptoms:Hypotension.  Sonographer:    Nathanel Devonshire Referring Phys: [id number] DIPTI BARAL IMPRESSIONS  1. Left ventricular ejection fraction, by estimation, is 60 to 65%. The left ventricle has normal function. The left ventricle has no regional wall motion abnormalities. Left ventricular diastolic parameters were normal.  2. Right ventricular systolic function is normal. The right ventricular size is normal.  3. The mitral valve is grossly normal. Trivial mitral valve regurgitation.  No evidence of mitral stenosis.  4. The aortic valve is tricuspid. Aortic valve regurgitation is not visualized. No aortic stenosis is present. FINDINGS  Left  Ventricle: Left ventricular ejection fraction, by estimation, is 60 to 65%. The left ventricle has normal function. The left ventricle has no regional wall motion abnormalities. The left ventricular internal cavity size was normal in size. There is  no left ventricular hypertrophy. Left ventricular diastolic parameters were normal. Right Ventricle: The right ventricular size is normal. No increase in right ventricular wall thickness. Right ventricular systolic function is normal. Left Atrium: Left atrial size was normal in size. Right Atrium: Right atrial size was normal in size. Pericardium: There is no evidence of pericardial effusion. Mitral Valve: The mitral valve is grossly normal. Trivial mitral valve regurgitation. No evidence of mitral valve stenosis. Tricuspid Valve: The tricuspid valve is grossly normal. Tricuspid valve regurgitation is mild . No evidence of tricuspid stenosis. Aortic Valve: The aortic valve is tricuspid. Aortic valve regurgitation is not visualized. No aortic stenosis is present. Aortic valve mean gradient measures 3.0 mmHg. Aortic valve peak gradient measures 7.2 mmHg. Aortic valve area, by VTI measures 2.57 cm. Pulmonic Valve: The pulmonic valve was grossly normal. Pulmonic valve regurgitation is not visualized. No evidence of pulmonic stenosis. Aorta: The aortic root and ascending aorta are structurally normal, with no evidence of dilitation. Venous: The inferior vena cava was not well visualized. IAS/Shunts: The atrial septum is grossly normal.  LEFT VENTRICLE PLAX 2D LVIDd:         4.70 cm     Diastology LVIDs:         2.90 cm     LV e' medial:    7.94 cm/s LV PW:         0.80 cm     LV E/e' medial:  9.7 LV IVS:        0.80 cm     LV e' lateral:   8.59 cm/s LVOT diam:     2.00 cm     LV E/e' lateral: 8.9 LV SV:         71 LV  SV Index:   34 LVOT Area:     3.14 cm LV IVRT:       99 msec  LV Volumes (MOD) LV vol d, MOD A2C: 44.9 ml LV vol d, MOD A4C: 48.5 ml LV vol s, MOD A2C: 13.7 ml LV vol s, MOD A4C: 16.2 ml LV SV MOD A2C:     31.2 ml LV SV MOD A4C:     48.5 ml LV SV MOD BP:      33.1 ml RIGHT VENTRICLE RV Basal diam:  2.60 cm TAPSE (M-mode): 1.8 cm LEFT ATRIUM             Index        RIGHT ATRIUM           Index LA diam:        3.70 cm 1.80 cm/m   RA Area:     12.00 cm LA Vol (A2C):   38.6 ml 18.76 ml/m  RA Volume:   26.50 ml  12.88 ml/m LA Vol (A4C):   39.8 ml 19.35 ml/m LA Biplane Vol: 39.3 ml 19.10 ml/m  AORTIC VALVE                    PULMONIC VALVE AV Area (Vmax):    2.39 cm     PV Vmax:       0.71 m/s AV Area (Vmean):   2.36 cm     PV Peak grad:  2.0 mmHg AV Area (VTI):  2.57 cm AV Vmax:           134.00 cm/s AV Vmean:          87.800 cm/s AV VTI:            0.275 m AV Peak Grad:      7.2 mmHg AV Mean Grad:      3.0 mmHg LVOT Vmax:         102.00 cm/s LVOT Vmean:        66.000 cm/s LVOT VTI:          0.225 m LVOT/AV VTI ratio: 0.82  AORTA Ao Root diam: 2.50 cm Ao Asc diam:  2.70 cm MITRAL VALVE               TRICUSPID VALVE MV Area (PHT): 4.00 cm    TR Peak grad:   30.7 mmHg MV E velocity: 76.70 cm/s  TR Vmax:        277.00 cm/s MV Maryl Blalock velocity: 51.80 cm/s MV E/Maansi Wike ratio:  1.48        SHUNTS                            Systemic VTI:  0.22 m                            Systemic Diam: 2.00 cm Darryle Decent MD Electronically signed by Darryle Decent MD Signature Date/Time: 07/06/2024/1:29:31 PM    Final    DG CHEST PORT 1 VIEW Result Date: 07/06/2024 EXAM: 1 VIEW(S) XRAY OF THE CHEST 07/06/2024 05:41:14 AM COMPARISON: 04/26/2024 CLINICAL HISTORY: Shock (HCC) FINDINGS: LUNGS AND PLEURA: Low lung volumes with mild asymmetric elevation of the right hemidiaphragm. No focal pulmonary opacity. No pleural effusion. No pneumothorax. HEART AND MEDIASTINUM: No acute abnormality of the cardiac and mediastinal silhouettes. BONES AND  SOFT TISSUES: No acute osseous abnormality. IMPRESSION: 1. Low lung volumes with mild asymmetric elevation of the right hemidiaphragm. Electronically signed by: Waddell Calk MD 07/06/2024 05:55 AM EST RP Workstation: [id number]   DG Abd Portable 1V Result Date: 07/06/2024 EXAM: 1 VIEW XRAY OF THE ABDOMEN 07/06/2024 03:04:00 AM COMPARISON: None available. CLINICAL HISTORY: Abdominal pain, left lower quadrant. FINDINGS: BOWEL: Nonobstructive bowel gas pattern. SOFT TISSUES: No abnormal calcifications. BONES: No acute fracture. IMPRESSION: 1. No acute findings. Electronically signed by: Oneil Devonshire MD 07/06/2024 03:19 AM EST RP Workstation: [id number]   CT ABDOMEN PELVIS W CONTRAST Result Date: 07/05/2024 EXAM: CT ABDOMEN AND PELVIS WITH CONTRAST 07/05/2024 03:04:48 AM TECHNIQUE: CT of the abdomen and pelvis was performed with the administration of 100 mL iohexol  (OMNIPAQUE ) 300 MG/ML solution. Multiplanar reformatted images are provided for review. Automated exposure control, iterative reconstruction, and/or weight-based adjustment of the mA/kV was utilized to reduce the radiation dose to as low as reasonably achievable. COMPARISON: None available. CLINICAL HISTORY: Diarrhea for several weeks with abdominal pain. FINDINGS: LOWER CHEST: No acute abnormality. LIVER: Fatty infiltration of the liver is noted. GALLBLADDER AND BILE DUCTS: The gallbladder is within normal limits. No biliary ductal dilatation. SPLEEN: No acute abnormality. PANCREAS: No acute abnormality. ADRENAL GLANDS: No acute abnormality. KIDNEYS, URETERS AND BLADDER: No renal calculi or obstructive changes are seen. Davyn Elsasser posterior renal cyst is noted on the left, stable from the prior exam. The bladder is decompressed. No perinephric or periureteral stranding. GI AND BOWEL: Stomach and small bowel are within normal limits. No obstructive or inflammatory changes  of the colon are seen. The appendix is not visualized, consistent with the prior surgical  history. There is no bowel obstruction. PERITONEUM AND RETROPERITONEUM: No ascites. No free air. VASCULATURE: Aorta is normal in caliber. LYMPH NODES: No lymphadenopathy. REPRODUCTIVE ORGANS: The uterus is within normal limits. No adnexal masses noted. BONES AND SOFT TISSUES: No acute osseous abnormality. No focal soft tissue abnormality. IMPRESSION: 1. No acute findings in the abdomen or pelvis. Electronically signed by: Oneil Devonshire MD 07/05/2024 03:11 AM EST RP Workstation: [id number]   CT ABDOMEN PELVIS W CONTRAST Result Date: 06/30/2024 CLINICAL DATA:  Left lower quadrant and left flank pain. Ongoing diarrhea. EXAM: CT ABDOMEN AND PELVIS WITH CONTRAST TECHNIQUE: Multidetector CT imaging of the abdomen and pelvis was performed using the standard protocol following bolus administration of intravenous contrast. RADIATION DOSE REDUCTION: This exam was performed according to the departmental dose-optimization program which includes automated exposure control, adjustment of the mA and/or kV according to patient size and/or use of iterative reconstruction technique. CONTRAST:  OMNIPAQUE  IOHEXOL  300 MG/ML  SOLN COMPARISON:  04/26/2024. FINDINGS: Lower chest: Mild bibasilar subsegmental linear atelectasis. Hepatobiliary: Hepatic steatosis. No suspicious focal hepatic lesion. Gallbladder is unremarkable. Pancreas: Unremarkable. No pancreatic ductal dilatation or surrounding inflammatory changes. Spleen: Normal in size without focal abnormality. Adrenals/Urinary Tract: Adrenal glands are unremarkable. Kidneys are normal, without renal calculi, suspicious focal lesion, or hydronephrosis. Stable 1.2 cm exophytic cyst of the left posterior mid kidney. Bladder is unremarkable. Stomach/Bowel: Stomach is within normal limits. Small bowel is grossly unremarkable. No obstruction or inflammatory changes. Status post appendectomy. Jaquila Santelli few sigmoid colonic diverticula are noted. Vascular/Lymphatic: No significant vascular  findings are present. Abdominal aorta is normal in caliber. No enlarged abdominal or pelvic lymph nodes. Reproductive: Uterus and bilateral adnexa are unremarkable. Other: No abdominopelvic ascites.  No intraperitoneal free air. Musculoskeletal: No acute osseous abnormality. No suspicious osseous lesion. Redemonstrated degenerative disc height loss and endplate osteophytosis at L5-S1. IMPRESSION: 1. No acute localizing findings in the abdomen or pelvis. 2. Hepatic steatosis. 3. Minimal sigmoid colonic diverticulosis. Electronically Signed   By: Harrietta Sherry M.D.   On: 06/30/2024 14:28    Microbiology: Recent Results (from the past 240 hours)  C Difficile Quick Screen w PCR reflex     Status: None   Collection Time: 07/05/24  2:53 AM   Specimen: STOOL  Result Value Ref Range Status   C Diff antigen NEGATIVE NEGATIVE Final   C Diff toxin NEGATIVE NEGATIVE Final   C Diff interpretation No C. difficile detected.  Final    Comment: Performed at [facility], [address], [place], [place] [address]  Gastrointestinal Panel by PCR , Stool     Status: None   Collection Time: 07/05/24  2:53 AM   Specimen: STOOL  Result Value Ref Range Status   Campylobacter species NOT DETECTED NOT DETECTED Final   Plesimonas shigelloides NOT DETECTED NOT DETECTED Final   Salmonella species NOT DETECTED NOT DETECTED Final   Yersinia enterocolitica NOT DETECTED NOT DETECTED Final   Vibrio species NOT DETECTED NOT DETECTED Final   Vibrio cholerae NOT DETECTED NOT DETECTED Final   Enteroaggregative E coli (EAEC) NOT DETECTED NOT DETECTED Final   Enteropathogenic E coli (EPEC) NOT DETECTED NOT DETECTED Final   Enterotoxigenic E coli (ETEC) NOT DETECTED NOT DETECTED Final   Shiga like toxin producing E coli (STEC) NOT DETECTED NOT DETECTED Final   Shigella/Enteroinvasive E coli (EIEC) NOT DETECTED NOT DETECTED Final   Cryptosporidium NOT  DETECTED NOT DETECTED Final   Cyclospora cayetanensis  NOT DETECTED NOT DETECTED Final   Entamoeba histolytica NOT DETECTED NOT DETECTED Final   Giardia lamblia NOT DETECTED NOT DETECTED Final   Adenovirus F40/41 NOT DETECTED NOT DETECTED Final   Astrovirus NOT DETECTED NOT DETECTED Final   Norovirus GI/GII NOT DETECTED NOT DETECTED Final   Rotavirus Tanica Gaige NOT DETECTED NOT DETECTED Final   Sapovirus (I, II, IV, and V) NOT DETECTED NOT DETECTED Final    Comment: Performed at [facility], [address], [place], [place] [address]  Urine Culture (for pregnant, neutropenic or urologic patients or patients with an indwelling urinary catheter)     Status: Abnormal   Collection Time: 07/06/24  2:45 AM   Specimen: In/Out Cath Urine  Result Value Ref Range Status   Specimen Description   Final    IN/OUT CATH URINE Performed at [facility], [address], [place], [place] [address]    Special Requests   Final    NONE Performed at [facility], [address] [place], [place] [address]    Culture 80,000 COLONIES/mL ESCHERICHIA COLI (Jon Lall)  Final   Report Status 07/08/2024 FINAL  Final   Organism ID, Bacteria ESCHERICHIA COLI (Teaira Croft)  Final      Susceptibility   Escherichia coli - MIC*    AMPICILLIN 8 SENSITIVE Sensitive     CEFAZOLIN (URINE) Value in next row Sensitive      2 SENSITIVEThis is Szymon Foiles modified FDA-approved test that has been validated and its performance characteristics determined by the reporting laboratory.  This laboratory is certified under the Clinical Laboratory Improvement Amendments CLIA as qualified to perform high complexity clinical laboratory testing.    CEFEPIME Value in next row Sensitive      2 SENSITIVEThis is Shawna Kiener modified FDA-approved test that has been validated and its performance characteristics determined by the reporting laboratory.  This laboratory is certified under the Clinical Laboratory Improvement Amendments CLIA as qualified to perform high complexity clinical laboratory  testing.    ERTAPENEM Value in next row Sensitive      2 SENSITIVEThis is Chistine Dematteo modified FDA-approved test that has been validated and its performance characteristics determined by the reporting laboratory.  This laboratory is certified under the Clinical Laboratory Improvement Amendments CLIA as qualified to perform high complexity clinical laboratory testing.    CEFTRIAXONE  Value in next row Sensitive      2 SENSITIVEThis is Herminia Warren modified FDA-approved test that has been validated and its performance characteristics determined by the reporting laboratory.  This laboratory is certified under the Clinical Laboratory Improvement Amendments CLIA as qualified to perform high complexity clinical laboratory testing.    CIPROFLOXACIN Value in next row Intermediate      2 SENSITIVEThis is Nyelle Wolfson modified FDA-approved test that has been validated and its performance characteristics determined by the reporting laboratory.  This laboratory is certified under the Clinical Laboratory Improvement Amendments CLIA as qualified to perform high complexity clinical laboratory testing.    GENTAMICIN Value in next row Sensitive      2 SENSITIVEThis is Bosten Newstrom modified FDA-approved test that has been validated and its performance characteristics determined by the reporting laboratory.  This laboratory is certified under the Clinical Laboratory Improvement Amendments CLIA as qualified to perform high complexity clinical laboratory testing.    NITROFURANTOIN Value in next row Sensitive      2 SENSITIVEThis is Nissim Fleischer modified FDA-approved test that has been validated and its performance characteristics determined  by the reporting laboratory.  This laboratory is certified under the Clinical Laboratory Improvement Amendments CLIA as qualified to perform high complexity clinical laboratory testing.    TRIMETH /SULFA  Value in next row Sensitive      2 SENSITIVEThis is Mahlik Lenn modified FDA-approved test that has been validated and its performance characteristics  determined by the reporting laboratory.  This laboratory is certified under the Clinical Laboratory Improvement Amendments CLIA as qualified to perform high complexity clinical laboratory testing.    AMPICILLIN/SULBACTAM Value in next row Sensitive      2 SENSITIVEThis is Avy Barlett modified FDA-approved test that has been validated and its performance characteristics determined by the reporting laboratory.  This laboratory is certified under the Clinical Laboratory Improvement Amendments CLIA as qualified to perform high complexity clinical laboratory testing.    PIP/TAZO Value in next row Sensitive      <=4 SENSITIVEThis is Ernest Popowski modified FDA-approved test that has been validated and its performance characteristics determined by the reporting laboratory.  This laboratory is certified under the Clinical Laboratory Improvement Amendments CLIA as qualified to perform high complexity clinical laboratory testing.    MEROPENEM Value in next row Sensitive      <=4 SENSITIVEThis is Semaj Kham modified FDA-approved test that has been validated and its performance characteristics determined by the reporting laboratory.  This laboratory is certified under the Clinical Laboratory Improvement Amendments CLIA as qualified to perform high complexity clinical laboratory testing.    * 80,000 COLONIES/mL ESCHERICHIA COLI  Culture, blood (Routine X 2) w Reflex to ID Panel     Status: None   Collection Time: 07/06/24  2:57 AM   Specimen: BLOOD RIGHT HAND  Result Value Ref Range Status   Specimen Description   Final    BLOOD RIGHT HAND Performed at [facility], [address], [place], [place] [address]    Special Requests   Final    BOTTLES DRAWN AEROBIC ONLY Blood Culture results may not be optimal due to an inadequate volume of blood received in culture bottles Performed at [facility], [address] [place], [place] [address]    Culture   Final    NO GROWTH 5 DAYS Performed at [facility],  [address] [place], [place] [address]    Report Status 07/11/2024 FINAL  Final  Culture, blood (Routine X 2) w Reflex to ID Panel     Status: None   Collection Time: 07/06/24  2:57 AM   Specimen: BLOOD RIGHT HAND  Result Value Ref Range Status   Specimen Description   Final    BLOOD RIGHT HAND Performed at [facility], [address] [place], [place] [address]    Special Requests   Final    BOTTLES DRAWN AEROBIC ONLY Blood Culture results may not be optimal due to an inadequate volume of blood received in culture bottles Performed at [facility], [address], [place], [place] [address]    Culture   Final    NO GROWTH 5 DAYS Performed at [facility], [address], [place], [place] [address]    Report Status 07/11/2024 FINAL  Final     Labs: Basic Metabolic Panel: Recent Labs  Lab 07/07/24 0920 07/08/24 0027 07/09/24 0541 07/10/24 0759 07/11/24 0555  NA 135 137 140 138 139  K 3.7 3.8 3.8 3.7 4.1  CL 104 104 107 104 102  CO2 20* 21* 23 25 27   GLUCOSE 124* 111* 110*  83 87  BUN <5* <5* 6 7 8   CREATININE 1.01* 0.94 0.85 0.87 0.91  CALCIUM 8.9 9.1 8.7* 8.7* 9.3  MG  --  2.1  --  1.9 2.1  PHOS  --   --   --  4.7* 6.2*   Liver Function Tests: Recent Labs  Lab 07/05/24 0126 07/06/24 0258 07/10/24 0759 07/11/24 0555  AST 49* 38 17 29  ALT 57* 44 20 29  ALKPHOS 65 43 31* 33*  BILITOT 0.5 0.2 <0.2 0.2  PROT 9.0* 5.9* 6.1* 6.4*  ALBUMIN 4.9 3.5 3.4* 3.6   Recent Labs  Lab 07/05/24 0126  LIPASE 30   No results for input(s): AMMONIA in the last 168 hours. CBC: Recent Labs  Lab 07/05/24 0126 07/05/24 2316 07/06/24 0258 07/08/24 0027 07/09/24 0541 07/10/24 0535 07/11/24 0555  WBC 16.7* 8.5 8.2 13.1* 15.4* 10.3 10.7*  NEUTROABS 8.9* 5.1  --   --   --  5.6 6.1  HGB 16.6* 12.7 12.4 14.4 12.5 12.9 13.6  HCT 49.8* 38.3 37.4 41.4 38.4 38.0 41.5  MCV 95.0 96.5 96.4 93.7 97.7 96.0 96.1  PLT 377 207 213 265 215 213 204    Cardiac Enzymes: Recent Labs  Lab 07/06/24 0256  CKTOTAL 43   BNP: BNP (last 3 results) No results for input(s): BNP in the last 8760 hours.  ProBNP (last 3 results) No results for input(s): PROBNP in the last 8760 hours.  CBG: No results for input(s): GLUCAP in the last 168 hours.     Signed:  Meliton Monte MD.  [facility] 07/11/2024, 1:05 PM       [1]  Allergies Allergen Reactions   Ultram [Tramadol] Rash

## 2024-07-20 LAB — BLOOD GAS, VENOUS
Acid-Base Excess: 6.8 mmol/L — ABNORMAL HIGH (ref 0.0–2.0)
Bicarbonate: 31.3 mmol/L — ABNORMAL HIGH (ref 20.0–28.0)
Drawn by: 682731
FIO2: 70 %
Patient temperature: 37
pCO2, Ven: 43 mmHg — ABNORMAL LOW (ref 44–60)
pH, Ven: 7.47 — ABNORMAL HIGH (ref 7.25–7.43)
pO2, Ven: 65 mmHg — ABNORMAL HIGH (ref 32–45)
